# Patient Record
Sex: Female | Born: 1950 | Race: Black or African American | Hispanic: No | Marital: Married | Smoking: Never smoker
Health system: Southern US, Community
[De-identification: ages and names within clinical notes are randomized; demographics above are authoritative.]

## PROBLEM LIST (undated history)

## (undated) DIAGNOSIS — E785 Hyperlipidemia, unspecified: Secondary | ICD-10-CM

## (undated) DIAGNOSIS — M545 Low back pain, unspecified: Secondary | ICD-10-CM

## (undated) DIAGNOSIS — F329 Major depressive disorder, single episode, unspecified: Secondary | ICD-10-CM

## (undated) DIAGNOSIS — R252 Cramp and spasm: Secondary | ICD-10-CM

## (undated) DIAGNOSIS — R600 Localized edema: Secondary | ICD-10-CM

## (undated) DIAGNOSIS — I252 Old myocardial infarction: Secondary | ICD-10-CM

## (undated) DIAGNOSIS — E559 Vitamin D deficiency, unspecified: Secondary | ICD-10-CM

## (undated) DIAGNOSIS — I10 Essential (primary) hypertension: Secondary | ICD-10-CM

## (undated) DIAGNOSIS — E119 Type 2 diabetes mellitus without complications: Secondary | ICD-10-CM

## (undated) DIAGNOSIS — Z9889 Other specified postprocedural states: Secondary | ICD-10-CM

## (undated) DIAGNOSIS — I2510 Atherosclerotic heart disease of native coronary artery without angina pectoris: Secondary | ICD-10-CM

## (undated) DIAGNOSIS — M25519 Pain in unspecified shoulder: Secondary | ICD-10-CM

## (undated) DIAGNOSIS — E041 Nontoxic single thyroid nodule: Secondary | ICD-10-CM

## (undated) DIAGNOSIS — R112 Nausea with vomiting, unspecified: Secondary | ICD-10-CM

## (undated) DIAGNOSIS — R531 Weakness: Secondary | ICD-10-CM

## (undated) DIAGNOSIS — M1990 Unspecified osteoarthritis, unspecified site: Secondary | ICD-10-CM

## (undated) DIAGNOSIS — F32A Depression, unspecified: Secondary | ICD-10-CM

## (undated) DIAGNOSIS — K5900 Constipation, unspecified: Secondary | ICD-10-CM

## (undated) DIAGNOSIS — D649 Anemia, unspecified: Secondary | ICD-10-CM

## (undated) DIAGNOSIS — E669 Obesity, unspecified: Secondary | ICD-10-CM

## (undated) HISTORY — PX: OTHER SURGICAL HISTORY: SHX169

## (undated) HISTORY — DX: Nontoxic single thyroid nodule: E04.1

## (undated) HISTORY — DX: Obesity, unspecified: E66.9

## (undated) HISTORY — DX: Localized edema: R60.0

## (undated) HISTORY — DX: Type 2 diabetes mellitus without complications: E11.9

## (undated) HISTORY — DX: Cramp and spasm: R25.2

## (undated) HISTORY — DX: Old myocardial infarction: I25.2

## (undated) HISTORY — PX: BACK SURGERY: SHX140

## (undated) HISTORY — DX: Hyperlipidemia, unspecified: E78.5

## (undated) HISTORY — DX: Atherosclerotic heart disease of native coronary artery without angina pectoris: I25.10

## (undated) HISTORY — DX: Vitamin D deficiency, unspecified: E55.9

## (undated) HISTORY — DX: Pain in unspecified shoulder: M25.519

## (undated) HISTORY — DX: Weakness: R53.1

## (undated) HISTORY — DX: Constipation, unspecified: K59.00

## (undated) HISTORY — DX: Essential (primary) hypertension: I10

## (undated) HISTORY — DX: Low back pain, unspecified: M54.50

---
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
## 2002-07-26 ENCOUNTER — Encounter: Payer: Self-pay | Admitting: Emergency Medicine

## 2002-07-26 ENCOUNTER — Emergency Department (HOSPITAL_COMMUNITY): Admission: EM | Admit: 2002-07-26 | Discharge: 2002-07-26 | Payer: Self-pay | Admitting: *Deleted

## 2003-02-08 DIAGNOSIS — I2510 Atherosclerotic heart disease of native coronary artery without angina pectoris: Secondary | ICD-10-CM

## 2003-02-08 HISTORY — DX: Atherosclerotic heart disease of native coronary artery without angina pectoris: I25.10

## 2004-09-20 ENCOUNTER — Inpatient Hospital Stay (HOSPITAL_COMMUNITY): Admission: EM | Admit: 2004-09-20 | Discharge: 2004-09-23 | Payer: Self-pay | Admitting: *Deleted

## 2004-09-21 ENCOUNTER — Encounter (INDEPENDENT_AMBULATORY_CARE_PROVIDER_SITE_OTHER): Payer: Self-pay | Admitting: Cardiology

## 2005-10-08 ENCOUNTER — Emergency Department (HOSPITAL_COMMUNITY): Admission: EM | Admit: 2005-10-08 | Discharge: 2005-10-08 | Payer: Self-pay | Admitting: Emergency Medicine

## 2007-01-18 ENCOUNTER — Encounter: Admission: RE | Admit: 2007-01-18 | Discharge: 2007-01-18 | Payer: Self-pay | Admitting: Family Medicine

## 2007-12-09 ENCOUNTER — Emergency Department (HOSPITAL_COMMUNITY): Admission: EM | Admit: 2007-12-09 | Discharge: 2007-12-10 | Payer: Self-pay | Admitting: Emergency Medicine

## 2008-06-16 ENCOUNTER — Encounter: Admission: RE | Admit: 2008-06-16 | Discharge: 2008-06-16 | Payer: Self-pay | Admitting: Family Medicine

## 2009-03-09 ENCOUNTER — Encounter: Admission: RE | Admit: 2009-03-09 | Discharge: 2009-04-06 | Payer: Self-pay | Admitting: Family Medicine

## 2009-03-18 ENCOUNTER — Encounter: Admission: RE | Admit: 2009-03-18 | Discharge: 2009-03-18 | Payer: Self-pay | Admitting: *Deleted

## 2009-04-16 ENCOUNTER — Ambulatory Visit (HOSPITAL_COMMUNITY): Admission: RE | Admit: 2009-04-16 | Discharge: 2009-04-17 | Payer: Self-pay | Admitting: Neurosurgery

## 2010-05-02 LAB — BASIC METABOLIC PANEL
BUN: 10 mg/dL (ref 6–23)
CO2: 28 mEq/L (ref 19–32)
Calcium: 10 mg/dL (ref 8.4–10.5)
Chloride: 104 mEq/L (ref 96–112)
Creatinine, Ser: 0.69 mg/dL (ref 0.4–1.2)
GFR calc Af Amer: >60 mL/min (ref >60)
GFR calc non Af Amer: >60 mL/min (ref >60)
Glucose, Bld: 102 mg/dL — ABNORMAL HIGH (ref 70–99)
Potassium: 4.4 mEq/L (ref 3.5–5.1)
Sodium: 139 mEq/L (ref 135–145)

## 2010-05-02 LAB — GLUCOSE, CAPILLARY
Glucose-Capillary: 115 mg/dL — ABNORMAL HIGH (ref 70–99)
Glucose-Capillary: 119 mg/dL — ABNORMAL HIGH (ref 70–99)
Glucose-Capillary: 128 mg/dL — ABNORMAL HIGH (ref 70–99)
Glucose-Capillary: 143 mg/dL — ABNORMAL HIGH (ref 70–99)
Glucose-Capillary: 177 mg/dL — ABNORMAL HIGH (ref 70–99)

## 2010-05-02 LAB — CBC
HCT: 38.8 % (ref 36.0–46.0)
Hemoglobin: 13.1 g/dL (ref 12.0–15.0)
MCHC: 33.8 g/dL (ref 30.0–36.0)
MCV: 89.7 fL (ref 78.0–100.0)
Platelets: 216 10*3/uL (ref 150–400)
RBC: 4.33 MIL/uL (ref 3.87–5.11)
RDW: 15.7 % — ABNORMAL HIGH (ref 11.5–15.5)
WBC: 6.6 10*3/uL (ref 4.0–10.5)

## 2010-05-02 LAB — SURGICAL PCR SCREEN
MRSA, PCR: NEGATIVE
Staphylococcus aureus: NEGATIVE

## 2010-06-09 ENCOUNTER — Other Ambulatory Visit: Payer: Self-pay | Admitting: Family Medicine

## 2010-06-09 DIAGNOSIS — Z1231 Encounter for screening mammogram for malignant neoplasm of breast: Secondary | ICD-10-CM

## 2010-06-11 ENCOUNTER — Ambulatory Visit
Admission: RE | Admit: 2010-06-11 | Discharge: 2010-06-11 | Disposition: A | Discharge status: Home / Self Care | Payer: 59 | Source: Ambulatory Visit | Attending: Family Medicine | Admitting: Family Medicine

## 2010-06-11 DIAGNOSIS — Z1231 Encounter for screening mammogram for malignant neoplasm of breast: Secondary | ICD-10-CM

## 2010-06-25 NOTE — Cardiovascular Report (Signed)
NAME:  Angela Morrison, Angela Morrison NO.:  [id number]   MEDICAL RECORD NO.:  [id number]          PATIENT TYPE:  INP   LOCATION:  2030                         FACILITY:  [facility]   PHYSICIAN:  Vesta Mixer, M.D. DATE OF BIRTH:  November 27, 1950   DATE OF PROCEDURE:  09/22/2004  DATE OF DISCHARGE:                              CARDIAC CATHETERIZATION   Ms. Angela Morrison is a 60 year old female who was admitted to the hospital with  episodes of chest pain. She had a diagnostic cath this morning by Dr. Mayford Knife  which revealed a tight right coronary stenosis. She was referred for PTCA  and stenting of this vessel.   Please see the previously dictated diagnostic cath by Dr. Mayford Knife.   The patient received 5200 units of heparin initially. Later in the case she  also received 2000 unit bolus. She received IV Integrilin in the double  bolus fashion. The right coronary artery was cannulated using a Judkins  right 4 guide. Later the case, this guide was traded out for an Amplatz  right 1 guide for better support. The right coronary artery was wired using  a Patriot guidewire. A 2.75 x 15 mm Quantum monorail was positioned across  the stenosis in the mid right coronary artery. It was inflated up to 6  atmospheres for 22 seconds which resulted in some improvement of this  lesion.   At this point a 2.5 x 16 mm TAXUS stent was positioned across the stenosis.  It was deployed at 16 atmospheres for 24 seconds. This stent was removed and  a second 2.5 x 16 mm stent was positioned just proximal to the first stent  and was deployed at 18 atmospheres for 22 seconds. At this point we needed  to trade out the guide and used the Amplatz right 1 guide for better back  up. This allowed passage of the 2.75 x 15 mm Quantum monorail used for post  stent dilatation.   It was inflated up to a total of 5 times throughout the two stented areas.  It was inflated 3 times for 18 atmospheres and then twice up to 20  atmospheres. This resulted in a very nice angiographic result. There is no  edge dissection. There is brisk flow down the vessel. The size achieved is  right at the size of the native vessel. There were no complications.   COMPLICATIONS:  None.   CONCLUSION:  Successful percutaneous transluminal coronary angioplasty and  stenting of the mid and proximal right coronary artery.  She received Plavix  600 mg here in the lab. She will be admitted to the [facility] and anticipate  discharge tomorrow.           ______________________________  Vesta Mixer, M.D.     PJN/MEDQ  D:  09/22/2004  T:  09/22/2004  Job:  [id number]   cc:   Pam Drown, M.D.  [address]  [place]  [place] [address]  Fax: [phone]   Armanda Magic, M.D.  [address]  [place], [place] [address]  Fax: [phone]

## 2010-06-25 NOTE — Consult Note (Signed)
NAME:  Angela Morrison, Angela Morrison             ACCOUNT NO.:  [id number]   MEDICAL RECORD NO.:  [id number]          PATIENT TYPE:  INP   LOCATION:  2030                         FACILITY:  [facility]   PHYSICIAN:  Armanda Magic, M.D.     DATE OF BIRTH:  18-Jan-1951   DATE OF CONSULTATION:  09/21/2004  DATE OF DISCHARGE:                                   CONSULTATION   REFERRING PHYSICIAN:  Dr. Renford Dills   PRIMARY DOCTOR:  Dr. Gweneth Dimitri   REASON FOR CONSULT:  Chest pain with multiple cardiac risk factors.   This is very pleasant 60 year old obese black female with the history of  hypertension, diabetes mellitus, and dyslipidemia who relates an episode of  substernal chest pain which awakened her from sleep. It started with severe  achiness in her arm around 4 a.m. early this morning, and then progressed to  severe chest tightness of a 7 or 8 out of 10. She woke her husband up and he  took her to the emergency room. On the way to the emergency room she  developed severe shortness of breath as well as nausea. She was given  aspirin and had no relief. She was started on IV nitroglycerin with relief  of chest and arm pain. She has not had any further episodes since admission.  She does give a several-month history of bilateral hand paresthesias that  awakened her from sleep. No occurrence with activity or use of hands. She  has had increasing fatigue over the past 6 weeks but attributes this to  stress at work and working long hours. Cardiac enzymes thus far are  negative. A 2-D echocardiogram today showed normal LV function, inadequate  for evaluation of regional wall motion abnormalities due to poor acoustical  windows.   PAST MEDICAL HISTORY:  Significant for adult-onset diabetes mellitus,  hypertension, dyslipidemia, and obesity.   PAST SURGICAL HISTORY:  None.   ALLERGIES:  None.   SOCIAL HISTORY:  She denies any tobacco use. She occasionally drinks  alcohol. She is married. She has one  child. She works for the [employer].   FAMILY HISTORY:  Her mother had cancer of the throat. Her father did not  have any cardiac disease but she does have two maternal uncles who both had  heart attacks in their 96s.   MEDICATIONS ON ADMISSION:  1.  Lotrel 5/10 a day.  2.  Actos 15 mg a day although she quit taking this secondary to memory      issues.   INPATIENT MEDICINES:  1.  Lopressor 25 mg b.i.d.  2.  Aspirin 325 mg a day.  3.  Protonix 40 mg a day.  4.  Lotrel 5/10 a day.  5.  Lipitor 20 mg a day.  6.  Actos 15 mg a day.  7.  Lovenox subcu q.12h.   REVIEW OF SYSTEMS:  Other than what is stated in the HPI is negative.   PHYSICAL EXAMINATION:  VITAL SIGNS:  Blood pressure is 144/83, pulse 71,  respirations 20, she is afebrile.  GENERAL:  This is well-developed, obese black  female in no acute distress.  HEENT:  Benign.  NECK:  Supple without lymphadenopathy. Carotid upstrokes are +2 bilaterally  with no bruits.  LUNGS:  Clear to auscultation throughout.  HEART:  Regular rate and rhythm. No murmurs, rubs, or gallops. Normal S1 and  S2.  ABDOMEN:  Soft, nontender, nondistended, with active bowel sounds. No  hepatosplenomegaly.  EXTREMITIES:  No edema.   LABORATORY DATA:  Sodium 140, potassium 3.8, chloride 108, bicarb 25, BUN 4,  creatinine 0.6, glucose 128. White cell count 7.1, platelet count 289,  hematocrit 37, hemoglobin 12. D-dimer less than 0.22. PT 12.1, INR 0.9, PTT  29. Myoglobins are 55.4, 56.4, and 61; MB less 1.0 x3; troponin less than  0.05 x3. EKG shows normal sinus rhythm with T wave inversion in lead III and  aVF, nonspecific T wave abnormality in the precordial leads. Chest x-ray  shows mild cardiomegaly, no CHF. A 2-D echocardiogram shows essentially  normal LV systolic overall but could not comment on regional wall motion  abnormalities.   ASSESSMENT:  1.  Chest pain, very worrisome for underlying coronary ischemia given the      chest  pressure with radiation to the left arm associated with nausea and      shortness of breath and relieved with nitroglycerin. The patient does      have cardiac risk factors including obesity, her age, diabetes mellitus,      hyperlipidemia, and hypertension. The patient also has a family history      of heart disease with multiple MIs in maternal uncles in their 62s.      Currently on Lovenox, aspirin, beta blocker, and ACE inhibitor without      recurrence of chest pain. Of note, the patient was hypertensive on      admission. It is unclear whether hypertension came first and she      developed some subendocardial ischemia due to increased wall stress and      decreased transmyocardial perfusion with resultant subendocardial      ischemia due to hypertension versus underlying coronary disease. She      does have clearly multiple cardiac risk factors for underlying coronary      disease. Given her significant obesity, most likely stress Cardiolite      study would be abnormal due to body habitus and therefore would      recommend, given the fairly high probability of underlying coronary      disease, to proceed with cardiac catheterization.  2.  Adult-onset diabetes mellitus, poorly controlled.  3.  Dyslipidemia, started on Lipitor.  4.  Hypertension, currently controlled   PLAN:  I have recommended proceeding with cardiac catheterization. I have  explained the risks and benefits of cardiac catheterization including the  risk of MI, CVA, death, bleeding complications, IV contrast allergy,  pseudoaneurysm, A-V fistula, and renal failure. The patient understands and  wishes to proceed. We have scheduled her for tomorrow morning at 8:30 a.m.  In regards to her other cardiac risk factors, would be aggressive in  treating dyslipidemia, adult-onset diabetes mellitus, as well as her  hypertension.      Armanda Magic, M.D. Electronically Signed     TT/MEDQ  D:  09/21/2004  T:  09/21/2004   Job:  [id number]   cc:   Pam Drown, M.D.  [address]  [place]  [place] [address]  Fax: [phone]

## 2010-06-25 NOTE — Cardiovascular Report (Signed)
NAME:  Angela Morrison, Angela Morrison             ACCOUNT NO.:  [id number]   MEDICAL RECORD NO.:  [id number]          PATIENT TYPE:  INP   LOCATION:  2030                         FACILITY:  [facility]   PHYSICIAN:  Armanda Magic, M.D.     DATE OF BIRTH:  25-Nov-1950   DATE OF PROCEDURE:  09/22/2004  DATE OF DISCHARGE:                              CARDIAC CATHETERIZATION   REFERRING PHYSICIAN:  Renford Dills, M.D.   PROCEDURE:  1.  Left heart catheterization.  2.  Coronary angiography.  3.  Left ventriculography.   OPERATOR:  Armanda Magic, M.D.   INDICATIONS:  Chest pain, shortness of breath.   COMPLICATIONS:  None.   IV ACCESS:  Via right femoral artery with a 6-French sheath.   INDICATION:  This is a 60 year old black female with a history of  hypertension, diabetes mellitus and hyperlipidemia, who, on presenting to  the emergency room after wakening with sudden onset of severe chest pain and  shortness of breath, cardiac enzymes were negative and EKG was non-ischemic.  She now presents for cardiac catheterization.   DESCRIPTION OF PROCEDURE:  The patient was brought to the cardiac  catheterization laboratory in a fasting non-sedated state.  Informed consent  was obtained.  The patient was connected to continuous heart rate and pulse  oximetry monitoring and intermittent blood pressure monitoring.  The right  groin was prepped and draped in a sterile fashion.  1% Xylocaine was used  for local anesthesia.  Using a modified Seldinger technique, a 6-French  sheath was placed in the right femoral artery.  Under fluoroscopic guidance,  a 6-French JL4 catheter was placed in the left coronary artery.  Multiple  cine films were taken  in 30-degree RAO and 40-degree LAO views.  This  catheter was then exchanged out over a guidewire for 6-French JR4 catheter,  which was placed under fluoroscopic guidance in the right coronary artery.  Multiple cine films were taken in the 30-degree RAO and 40-degree LAO  views.  This catheter was then exchanged out over a guidewire for 6-French angled  pigtail catheter which was placed under fluoroscopic guidance into the left  ventricular cavity.  Left ventriculography was performed in the 30-degree  RAO views using a total of 30 mL of contrast at 15 mL per second.  The  catheter was then pulled back across the aortic valve with no significant  gradient noted.  At the end of the procedure, all catheters were removed and  the patient subsequently went onto PCI of the RCA.   RESULTS:  The left main coronary is widely patent and trifurcates into a  left anterior descending artery, the left circumflex artery and ramus  branch.  The left anterior descending artery is widely patent throughout its  course to the apex, giving rise to a diagonal branch which is widely patent.   The left circumflex is widely patent throughout its course, giving rise to a  large obtuse marginal branch which is widely patent.  The ongoing circumflex  is widely patent.   The ramus is widely patent and bifurcates into 2 daughter branches.  The  inferior branch has a 30% to 40% proximal stenosis, otherwise patent.   The right coronary artery is diffusely diseased with a 50% proximal  eccentric plaque and then a mid 30% narrowing and just distal to the mid 30%  narrowing, there is an 80% apple core lesion of the mid-to-distal RCA.  The  vessel then bifurcates into a posterior descending artery and posterolateral  artery, both of which are widely patent.   Left ventriculography shows normal LV systolic function, aortic pressure  150/94 mmHg, LV pressure 153/12 mmHg.   ASSESSMENT:  1.  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  2.  Normal left ventricular function..  3.  Hypertension, poorly controlled  4.  Diabetes mellitus.  5.  Dyslipidemia, poorly controlled.   PLAN:  PCI to RCA by Dr. Elease Hashimoto, aspirin and Plavix, Integrilin IV per Dr.  Elease Hashimoto,  start Lipitor 40 mg a day, fasting statin panel in 6 weeks, increase  Lopressor to 50 mg b.i.d.      Armanda Magic, M.D.  Electronically Signed     TT/MEDQ  D:  09/22/2004  T:  09/22/2004  Job:  [id number]   cc:   Deirdre Peer. Polite, M.D.   Pam Drown, M.D.  [address]  [place]  [place] [address]  Fax: [phone]

## 2010-06-25 NOTE — H&P (Signed)
NAME:  Angela Morrison, Angela Morrison                 ACCOUNT NO.:  0   MEDICAL RECORD NO.:  [id number]           PATIENT TYPE:   LOCATION:                               FACILITY:  [facility]   PHYSICIAN:  [name] L. Ladona Ridgel, MD  DATE OF BIRTH:  1950-09-20   DATE OF ADMISSION:  09/20/2004  DATE OF DISCHARGE:                                HISTORY & PHYSICAL   CHIEF COMPLAINT:  Chest pain.   PRIMARY CARE PHYSICIAN:  Dr. Gweneth Dimitri.   HISTORY OF PRESENT ILLNESS:  This is a 60 year old African-American female  who was in her usual state of health until she was awakened at 3 a.m. with  excruciating left arm pain, she described it as 3 out of 4/10.  She states  she laid back down, the pain persisted so she woke her husband up.  Patient  noted pressure then in her left upper chest which was associated with some  nausea.  So she came to the emergency room.  On the way to the emergency  room the patient developed shortness of breath.  In the emergency room the  patient was found to be hypertensive, tachycardic and was started on  sublingual nitro and likely an aspirin.  She states that the pain did not  improve then, so they started her on a nitro drip, which relieved the pain.  She states the pain was exacerbated by nothing, it was persistent.   REVIEW OF SYSTEMS:  She states she has had some changes in vision and is  scheduled to see the ophthalmologist.  She has had significant weight gain  over the last 6 months, but she associates that to stress and increased  eating.  She said she has had frequent urination but no dysuria but some  urgency, no constipation, no diarrhea, no melena, no hematochezia, no muscle  or joint discomfort.  She has had some insomnia which her primary care  physician gave her meds for which did not help.  All other review of systems  appear to be negative.   PAST MEDICAL HISTORY:  1.  Hypertension.  2.  Diabetes.   She is currently not taking her diabetic medication because  she thought it  was making her lose her memory.   PAST SURGICAL HISTORY:  Appendectomy.   SOCIAL HISTORY:  She denies tobacco.  She has limited alcohol.  She is  married with a daughter, works for the [employer] in [employer].   FAMILY HISTORY:  Mom is living with cancer of the throat and glaucoma.  Dad  is living with hearing loss but no other medical problems that she is aware  of.   ALLERGIES:  NO KNOWN DRUG ALLERGIES.   MEDICATIONS:  She is supposed to be taking Lotrel 5/10 daily, Actos 50 mg  daily.  She states a water pill, but I believe it is the Lotrel that she is  referring to.   VITAL SIGNS:  Temperature 98.8, blood pressure 170/102 on admission, after  nitro 107/74.  Pulse was 124 on admission, after Toprol she is  64.  Respirations 30 down to 60, saturation 100% on 2 liters.  Generally, this is  a well-developed, well-nourished, moderately obese African-American female  in no acute distress.  She is normocephalic, atraumatic.  Pupils equal,  round, reactive to light, extraocular muscles are intact, mucous membranes  are moist.  NECK:  Supple.  There is no JVD, no lymph nodes, no carotid bruits.  CHEST:  Clear to auscultation.  There is no rhonchi, rubs or wheezes.  CARDIOVASCULAR:  Regular rate, rhythm.  A positive S1, S2.  No S3, S4.  She  has no costovertebral angle tenderness on back exam.  ABDOMEN:  Soft, nontender, nondistended with positive bowel sounds.  EXTREMITIES:  Show no clubbing, cyanosis or edema.  NEUROLOGICAL:  Cranial nerves II-XII are intact, power is 5/5, DTRs are 2+.   LABS:  Point of care enzymes are negative x3.  Other labs:  Initially, her  first BMET in the emergency room showed a potassium of 8.3, this was  repeated, and is as follows:  Sodium is 139, potassium 4.0, chloride 108,  CO2 is 35.7, BUN 7, creatinine is 0.6.  Her blood sugars were 149 and 169.  PT is 12.1 with an INR of 0.9.  EKG is normal sinus rhythm  with a heart rate  of 64, no ST T-wave changes are noted.  Chest x-ray was reviewed, shows  cardiomegaly with low lung volumes, no obvious congestive heart failure is  present.   ASSESSMENT AND PLAN:  This is a 60 year old African-American female with  atypical chest pain waking her from sleep, associated with left arm  radiation, nausea and shortness of breath.  The patient's pain was relieved  with a nitro drip.  1.  Cardiovascular.  Atypical chest pain relieved with nitroglycerin.  Will      continue her aspirin, nitroglycerin drip, beta blockade and start her on      Lovenox at full ACS levels.  Will check a D-dimer.  If positive, will      check a chest CT.  Will also check a TSH.  Check a 2D echocardiogram.  2.  Pulmonary.  Shortness of breath has resolved.  May have been associated      to anxiety; however, I will check a D-dimer.  If it is positive, will      check a CT.  Please continue oxygen.  3.  Gastrointestinal.  Will start her on Protonix while on aspirin to treat      possible gastroesophageal reflux disease.  4.  Genitourinary.  With the frequency and urgency, will check a UA, C&S.  5.  Endocrine.  Will check a TSH, hemoglobin A1, continue her Actos and add      sliding scale insulin.      [name] L. Ladona Ridgel, MD  Electronically Signed     MLT/MEDQ  D:  09/20/2004  T:  09/20/2004  Job:  [id number]   cc:   Pam Drown, M.D.  [address]  [place]  [place] [address]  Fax: [phone]   [facility]  [address]  [place], [place] [address]

## 2010-06-25 NOTE — Discharge Summary (Signed)
NAME:  Angela Morrison, Angela Morrison             ACCOUNT NO.:  [id number]   MEDICAL RECORD NO.:  [id number]          PATIENT TYPE:  INP   LOCATION:  6531                         FACILITY:  [facility]   PHYSICIAN:  Armanda Magic, M.D.     DATE OF BIRTH:  October 31, 1950   DATE OF ADMISSION:  09/20/2004  DATE OF DISCHARGE:  09/23/2004                                 DISCHARGE SUMMARY   ADMITTING PHYSICIAN:  [name] L. Ladona Ridgel, M.D., [facility].   DISCHARGING PHYSICIAN:  Armanda Magic, M.D.   CHIEF COMPLAINT AND REASON FOR ADMISSION:  Ms. Kreps is a 60 year old female  patient, diabetic, hypertensive history who was awakened on the morning of  admission with significant left arm aching.  This progressed to chest  tightness.  She woke up her husband and came to the ER, was given aspirin  without any relief.  She had also developed shortness of breath and nausea  en route to the hospital.  She was subsequently placed on IV nitroglycerin  in the ER with gradual relief of chest and arm pain.  This is her only  episode of this type of significant.  Over the past several months, she has  had bilateral hand paresthesias that have awakened her from sleeping.  She  does not have these similar symptoms with activity, exertion or use of her  hands.  She has noticed increased fatigue for about four to six weeks.  Patient has been attributing this to increased work stressors and increased  working hours.  EKG done at time of arrival shows sinus rhythm with T-wave  inversion in lead III, elevated J point in aVL, subtle ST segment elevation  less than 1 mm in V2, questionable T-wave inversion in lead V3.  Initial  point of care enzymes were negative.  D-dimer was negative and patient was  not hypoxic upon admission.  Because of these findings, Hospitalists  admitted the patient with the following diagnoses.   ADMISSION DIAGNOSES:  1.  Chest pain relieved with nitroglycerin.  Request cardiology consult.  2.  With  atypical chest pain, rule out pulmonary embolus.  3.  Rule out gastrointestinal etiology such as ulcer or reflux.  4.  Reported history of urinary frequency and urgency.  5.  Diabetes.  Patient has not been taking medications recently secondary to      concerns of medication induced memory loss.   HOSPITAL COURSE:  PROBLEM #1 -  CHEST DISCOMFORT:  Cardiology consult was  obtained on the morning of September 21, 2004, with Dr. Mayford Knife.  Subsequently  cardiac isoenzymes were found to be negative.  EKG was stable.  A 2-D  echocardiogram  had also been done prior to our consultation.  It was  inadequate for evaluation of regional wall motion abnormalities.  Otherwise,  normal LVEF.  A D-dimer had been checked and this was negative.  CT of the  chest was checked and this was negative for PE.  Based on the patient's  symptoms, Dr. Mayford Knife was quite concerned she had underlying occult coronary  disease and due to patient's body habitus, moderate size abdomen and large  breasts, we opted to proceed directly with coronary angiography.  Patient  was taken to the cath lab on September 22, 2004, where she was found to have a  tight lesion of the RCA.  Subsequently, Dr. Elease Hashimoto performed PCI and TAXUS  stent implantation to the proximal and mid RCA.  Patient tolerated the  procedure well.  By September 23, 2004, patient's groin was unremarkable.  Distal pulse was intact.  She was without chest pain and determined  appropriate for discharge home.  From a cardiac standpoint, because of her  new diagnosis of CAD, beta-blocker Toprol, has been added.  She will  continue her Lotrel as previous.   PROBLEM #2 -  DIABETES MELLITUS:  Again, as mentioned in the history of  present illness, patient had stopped taking her Actos because she felt this  was causing memory loss.  This medication was reinitiated by the  Hospitalists.  Hemoglobin A1c was checked and was elevated at 7.5.  TSH was  checked due to patient's  complaints of fatigue.  This was normal at 1.341.  Patient will discharge home on Actos and follow up with primary care  physician for additional diabetic management.   PROBLEM #3 -  URINARY FREQUENCY AND URGENCY:  Patient had been empirically  started on Cipro. Urinalysis and culture was obtained.  There were a number  of leukocytes in the urine without positive nitrites, no protein, no red  blood cells, and few bacteria.  Urine culture was negative.  The Cipro was  only continued through the hospitalization.   PROBLEM #4 -  DYSLIPIDEMIA:  Due to patient's complaint of chest pain, a  fasting lipid panel was obtained.  Cholesterol 258, triglycerides 133, HDL  47 and LDL 184.  Based on these findings, Lipitor 40 mg was added and we are  recommending that patient follow up in our office for repeat Statin panel in  six weeks.  Note that LFTs were normal upon this hospitalization.   FINAL DISCHARGE DIAGNOSES:  1.  Chest pain and arm pain secondary to single vessel coronary artery      disease.  2.  Status post percutaneous coronary intervention and TAXUS stent      implantation to mid and proximal right coronary artery.  3.  Dyslipidemia with new initiation of Statin therapy.  4.  Diabetes mellitus, uncontrolled, initiated on oral therapy.  5.  Hypertension, currently controlled.  6.  Dysuria and urinary frequency with negative urine culture, negative      urinalysis, status post empiric treatment with Cipro.   DISCHARGE MEDICATIONS:  1.  Aspirin 325 mg daily.  2.  Lotrel 5/10 daily.  3.  Actos 15 mg daily.  4.  Toprol XL 50 mg daily.  5.  Plavix 75 mg daily.  6.  Lipitor 40 mg daily.   DIET:  Heart healthy.   ACTIVITY RESTRICTIONS:  Increase activity slowly, shower only for the next  two days.  No lifting more than 10 pounds for the next two days.  May return  to work on Monday, September 27, 2004.  WOUND CARE:  You are to call Dr. Norris Cross office if you have any swelling or   increased bruising at the right groin site from the catheterization.   FOLLOW UP APPOINTMENTS:  1.  You need to call Dr. Pam Drown, your primary care physician, for      follow-up regarding your diabetes and if urinary symptoms recur.  2.  You should follow up with  Dr. Mayford Knife in the office on October 07, 2004,      at 10:30 in the morning.  3.  You are to have a fasting Statin panel to check your cholesterol and      liver enzymes at Dr. Norris Cross office on November 04, 2004, at 7:30 in      the morning.  Instructions have been given for the patient to have      nothing to eat or drink eight hours before the cholesterol lab work.      Allison L. Ulla Gallo, M.D.  Electronically Signed    ALE/MEDQ  D:  09/23/2004  T:  09/23/2004  Job:  [id number]   cc:   Pam Drown, M.D.  [address]  [place]  [place] [address]  Fax: [phone]

## 2010-11-09 LAB — POCT CARDIAC MARKERS: Troponin i, poc: <0.05

## 2010-11-09 LAB — CBC
HCT: 38
MCHC: 33.3
MCV: 88.8
Platelets: 226
RDW: 14.7

## 2010-11-09 LAB — DIFFERENTIAL
Basophils Absolute: 0
Eosinophils Absolute: 0.1
Lymphocytes Relative: 10 — ABNORMAL LOW
Lymphs Abs: 1
Monocytes Absolute: 0.8
Monocytes Relative: 7
Neutro Abs: 8.8 — ABNORMAL HIGH

## 2010-11-09 LAB — LIPASE, BLOOD: Lipase: 22

## 2010-11-09 LAB — URINALYSIS, ROUTINE W REFLEX MICROSCOPIC
Bilirubin Urine: NEGATIVE
Hgb urine dipstick: NEGATIVE
Ketones, ur: NEGATIVE
Urobilinogen, UA: 1

## 2010-11-09 LAB — COMPREHENSIVE METABOLIC PANEL
AST: 17
Alkaline Phosphatase: 105
BUN: 13
CO2: 25
Calcium: 9.5
Creatinine, Ser: 0.8
Glucose, Bld: 126 — ABNORMAL HIGH
Sodium: 135
Total Bilirubin: 1
Total Protein: 8

## 2010-11-09 LAB — B-NATRIURETIC PEPTIDE (CONVERTED LAB): Pro B Natriuretic peptide (BNP): <30

## 2011-08-16 ENCOUNTER — Emergency Department (HOSPITAL_COMMUNITY): Payer: 59

## 2011-08-16 ENCOUNTER — Emergency Department (HOSPITAL_COMMUNITY)
Admission: EM | Admit: 2011-08-16 | Discharge: 2011-08-16 | Disposition: A | Discharge status: Home / Self Care | Payer: 59 | Attending: Emergency Medicine | Admitting: Emergency Medicine

## 2011-08-16 DIAGNOSIS — R0602 Shortness of breath: Secondary | ICD-10-CM | POA: Insufficient documentation

## 2011-08-16 DIAGNOSIS — Z79899 Other long term (current) drug therapy: Secondary | ICD-10-CM | POA: Insufficient documentation

## 2011-08-16 DIAGNOSIS — N390 Urinary tract infection, site not specified: Secondary | ICD-10-CM

## 2011-08-16 DIAGNOSIS — R42 Dizziness and giddiness: Secondary | ICD-10-CM

## 2011-08-16 LAB — PROTIME-INR
INR: 0.97 (ref 0.00–1.49)
Prothrombin Time: 13.1 seconds (ref 11.6–15.2)

## 2011-08-16 LAB — CBC
Hemoglobin: 13.1 g/dL (ref 12.0–15.0)
MCH: 28.7 pg (ref 26.0–34.0)
MCHC: 33.2 g/dL (ref 30.0–36.0)
Platelets: 252 10*3/uL (ref 150–400)

## 2011-08-16 LAB — COMPREHENSIVE METABOLIC PANEL
ALT: 9 U/L (ref 0–35)
Calcium: 9.8 mg/dL (ref 8.4–10.5)
Creatinine, Ser: 0.67 mg/dL (ref 0.50–1.10)
GFR calc Af Amer: >90 mL/min (ref >90)
Glucose, Bld: 117 mg/dL — ABNORMAL HIGH (ref 70–99)
Sodium: 139 mEq/L (ref 135–145)
Total Protein: 8.1 g/dL (ref 6.0–8.3)

## 2011-08-16 LAB — URINALYSIS, ROUTINE W REFLEX MICROSCOPIC
Hgb urine dipstick: NEGATIVE
Nitrite: NEGATIVE
Specific Gravity, Urine: 1.011 (ref 1.005–1.030)
Urobilinogen, UA: 0.2 mg/dL (ref 0.0–1.0)

## 2011-08-16 LAB — URINE MICROSCOPIC-ADD ON

## 2011-08-16 MED ORDER — FOSFOMYCIN TROMETHAMINE 3 G PO PACK
3.0000 g | PACK | Freq: Once | ORAL | Status: AC
  Administered 2011-08-16: 3 g via ORAL
  Filled 2011-08-16: qty 3

## 2011-08-16 NOTE — ED Notes (Signed)
Checked patient cbg it was 67 notified RN Ed of blood sugar

## 2011-08-16 NOTE — ED Notes (Signed)
Patient transported to CT 

## 2011-08-16 NOTE — ED Provider Notes (Signed)
History     CSN: [id number]  Arrival date & time 08/16/11  4098   First MD Initiated Contact with Patient 08/16/11 [phone]      Chief Complaint  Patient presents with  . Dizziness  . Shortness of Breath     HPI The patient presents with dizziness.  Notes that when she developed that she was in her usual state of health.  She awoke approximately 3 hours ago with dizziness.  She describes the room spinning sensationthat has been intermittently present since onset.  She notes that when symptoms are present she is mildly lightheaded, but in no pain.  She denies any confusion, vomiting, visual changes.  There are no clear precipitating, exacerbating, alleviating factors. She denies any prior similar occurrences. Notably, the patient is currently being treated for scalp infection with plaquenil, to which she has previously had adverse (nausea) reactions.  She has a Hx of CAD w prior stenting, and HTN.  She is compliant with all meds. No past medical history on file.  No past surgical history on file.  No family history on file.  History  Substance Use Topics  . Smoking status: Not on file  . Smokeless tobacco: Not on file  . Alcohol Use: Not on file    OB History    No data available      Review of Systems  Constitutional:       HPI  HENT:       HPI otherwise negative  Eyes: Negative.   Respiratory:       HPI, otherwise negative  Cardiovascular:       HPI, otherwise nmegative  Gastrointestinal: Negative for vomiting.  Genitourinary:       HPI, otherwise negative  Musculoskeletal:       HPI, otherwise negative  Skin: Negative.   Neurological: Negative for syncope.    Allergies  Review of patient's allergies indicates no known allergies.  Home Medications   Current Outpatient Rx  Name Route Sig Dispense Refill  . ACETAMINOPHEN ER 650 MG PO TBCR Oral Take 650 mg by mouth every 8 (eight) hours as needed. Pain    . AMLODIPINE BESY-BENAZEPRIL HCL 5-10 MG PO CAPS  Oral Take 1 capsule by mouth daily.    . ASPIRIN 325 MG PO TABS Oral Take 325 mg by mouth daily.    Marland Kitchen CLOPIDOGREL BISULFATE 75 MG PO TABS Oral Take 75 mg by mouth daily.    Marland Kitchen COENZYME Q10 30 MG PO CAPS Oral Take 30 mg by mouth daily.    . ERGOCALCIFEROL 50000 UNITS PO CAPS Oral Take 50,000 Units by mouth 2 (two) times a week. Takes on Mondays & Fridays    . HYDROXYCHLOROQUINE SULFATE 200 MG PO TABS Oral Take 200 mg by mouth 2 (two) times daily.    Marland Kitchen PIOGLITAZONE HCL 15 MG PO TABS Oral Take 15 mg by mouth daily.    Marland Kitchen SIMVASTATIN 20 MG PO TABS Oral Take 20 mg by mouth every evening.      BP 155/94  Pulse 88  Temp 98 F (36.7 C) (Oral)  Resp 20  SpO2 100%  Physical Exam  Nursing note and vitals reviewed. Constitutional: She is oriented to person, place, and time. She appears well-developed and well-nourished. No distress.  HENT:  Head: Normocephalic and atraumatic.  Eyes: Conjunctivae and EOM are normal.  Cardiovascular: Normal rate and regular rhythm.   Pulmonary/Chest: Effort normal and breath sounds normal. No stridor. No respiratory distress.  Abdominal: She exhibits  no distension.  Musculoskeletal: She exhibits no edema.  Neurological: She is alert and oriented to person, place, and time. No cranial nerve deficit.       Strength is symmetric 5/5 in all extremities.  There is no discoordination , no ataxia , gait is appropriate  Skin: Skin is warm and dry.  Psychiatric: She has a normal mood and affect.    ED Course  Procedures (including critical care time)  Labs Reviewed  GLUCOSE, CAPILLARY - Abnormal; Notable for the following:    Glucose-Capillary 107 (*)     All other components within normal limits  CBC  COMPREHENSIVE METABOLIC PANEL  TROPONIN I  URINALYSIS, ROUTINE W REFLEX MICROSCOPIC   Dg Chest 2 View  08/16/2011  *RADIOLOGY REPORT*  Clinical Data: Shortness of breath and dizziness.  History of hypertension and diabetes.  CHEST - 2 VIEW  Comparison: 04/10/2009.   Findings: Poor inspiration.  Grossly normal sized heart.  Minimal scoliosis.  IMPRESSION: No acute abnormality.  Original Report Authenticated By: Darrol Angel, M.D.     No diagnosis found.  Cardiac 80 sinus rhythm normal Pulse ox 100% ra, normal   Date: 08/16/2011  Rate: 85  Rhythm: normal sinus rhythm  QRS Axis: normal  Intervals: QT prolonged  ST/T Wave abnormalities: normal  Conduction Disutrbances:none  Narrative Interpretation:   Old EKG Reviewed: changes noted Minimal changes in anterior T waves from 11/09 - ABNORMAL ECG   MDM  This 61 year old female now presents with ongoing dizziness.given the patient's description of a new medication, this is an immediate suspicion.  Given the relatively sudden onset of symptoms, in an elderly female there is some suspicion of acute intracranial process.  The patient's evaluation is most notable for demonstration of chronic hypertensive effects intracerebrally, and a possibly UTI.  Given the absence of any focal neurologic deficits, there is low suspicion for ongoing acute CVA.  The patient's denial of appreciable chest pain, dyspnea is reassuring for the low suspicion of significant cardiac ischemia or pulmonary etiology.  The patient's medication use, this seems most likely, the patient's urine that was consistent with UTI will treat or.  Discussed all findings with the patient her family, including the for continued monitoring and evaluation.  Specifically, the patient is to consider carotid Doppler studies, and to discuss further medication titration with her team of physicians.  Given that the patient is already taking aspirin and Plavix, and there is low suspicion for acute new CVA, MRI is not indicated.   Gerhard Munch, MD 08/16/11 1229

## 2011-08-16 NOTE — ED Notes (Signed)
Pt in CT.

## 2011-08-18 ENCOUNTER — Other Ambulatory Visit: Payer: Self-pay | Admitting: Family Medicine

## 2011-08-18 DIAGNOSIS — R42 Dizziness and giddiness: Secondary | ICD-10-CM

## 2011-08-19 ENCOUNTER — Ambulatory Visit: Payer: 59 | Attending: Family Medicine | Admitting: Physical Therapy

## 2011-08-19 DIAGNOSIS — IMO0001 Reserved for inherently not codable concepts without codable children: Secondary | ICD-10-CM | POA: Insufficient documentation

## 2011-08-19 DIAGNOSIS — M25669 Stiffness of unspecified knee, not elsewhere classified: Secondary | ICD-10-CM | POA: Insufficient documentation

## 2011-08-19 DIAGNOSIS — M25569 Pain in unspecified knee: Secondary | ICD-10-CM | POA: Insufficient documentation

## 2011-08-23 ENCOUNTER — Ambulatory Visit: Payer: 59 | Admitting: Physical Therapy

## 2011-08-25 ENCOUNTER — Ambulatory Visit: Payer: 59 | Admitting: Rehabilitation

## 2011-08-30 ENCOUNTER — Ambulatory Visit: Payer: 59 | Admitting: Physical Therapy

## 2011-09-01 ENCOUNTER — Ambulatory Visit: Payer: 59 | Admitting: Physical Therapy

## 2011-09-01 ENCOUNTER — Ambulatory Visit
Admission: RE | Admit: 2011-09-01 | Discharge: 2011-09-01 | Disposition: A | Discharge status: Home / Self Care | Payer: 59 | Source: Ambulatory Visit | Attending: Family Medicine | Admitting: Family Medicine

## 2011-09-01 DIAGNOSIS — R42 Dizziness and giddiness: Secondary | ICD-10-CM

## 2011-09-02 ENCOUNTER — Other Ambulatory Visit: Payer: Self-pay | Admitting: Family Medicine

## 2011-09-02 DIAGNOSIS — E041 Nontoxic single thyroid nodule: Secondary | ICD-10-CM

## 2011-09-05 ENCOUNTER — Other Ambulatory Visit: Payer: Self-pay | Admitting: Family Medicine

## 2011-09-05 ENCOUNTER — Ambulatory Visit
Admission: RE | Admit: 2011-09-05 | Discharge: 2011-09-05 | Disposition: A | Discharge status: Home / Self Care | Payer: 59 | Source: Ambulatory Visit | Attending: Family Medicine | Admitting: Family Medicine

## 2011-09-05 DIAGNOSIS — E041 Nontoxic single thyroid nodule: Secondary | ICD-10-CM

## 2011-09-06 ENCOUNTER — Ambulatory Visit: Payer: 59 | Admitting: Physical Therapy

## 2011-09-08 ENCOUNTER — Ambulatory Visit: Payer: 59 | Attending: Family Medicine | Admitting: Physical Therapy

## 2011-09-08 DIAGNOSIS — IMO0001 Reserved for inherently not codable concepts without codable children: Secondary | ICD-10-CM | POA: Insufficient documentation

## 2011-09-08 DIAGNOSIS — M25569 Pain in unspecified knee: Secondary | ICD-10-CM | POA: Insufficient documentation

## 2011-09-08 DIAGNOSIS — M25669 Stiffness of unspecified knee, not elsewhere classified: Secondary | ICD-10-CM | POA: Insufficient documentation

## 2011-09-13 ENCOUNTER — Ambulatory Visit: Payer: 59 | Admitting: Physical Therapy

## 2011-09-15 ENCOUNTER — Other Ambulatory Visit (HOSPITAL_COMMUNITY)
Admission: RE | Admit: 2011-09-15 | Discharge: 2011-09-15 | Disposition: A | Discharge status: Home / Self Care | Payer: 59 | Source: Ambulatory Visit | Attending: Interventional Radiology | Admitting: Interventional Radiology

## 2011-09-15 ENCOUNTER — Ambulatory Visit
Admission: RE | Admit: 2011-09-15 | Discharge: 2011-09-15 | Disposition: A | Discharge status: Home / Self Care | Payer: 59 | Source: Ambulatory Visit | Attending: Family Medicine | Admitting: Family Medicine

## 2011-09-15 ENCOUNTER — Ambulatory Visit: Payer: 59 | Admitting: Physical Therapy

## 2011-09-15 DIAGNOSIS — E049 Nontoxic goiter, unspecified: Secondary | ICD-10-CM | POA: Insufficient documentation

## 2011-09-15 DIAGNOSIS — E041 Nontoxic single thyroid nodule: Secondary | ICD-10-CM

## 2011-09-20 ENCOUNTER — Ambulatory Visit: Payer: 59 | Admitting: Physical Therapy

## 2011-09-22 ENCOUNTER — Ambulatory Visit: Payer: 59 | Admitting: Physical Therapy

## 2011-09-27 ENCOUNTER — Ambulatory Visit: Payer: 59 | Admitting: Physical Therapy

## 2011-09-29 ENCOUNTER — Ambulatory Visit: Payer: 59 | Admitting: Physical Therapy

## 2011-10-04 ENCOUNTER — Ambulatory Visit: Payer: 59 | Admitting: Physical Therapy

## 2011-10-06 ENCOUNTER — Encounter: Payer: 59 | Admitting: Rehabilitation

## 2011-10-11 ENCOUNTER — Ambulatory Visit: Payer: 59 | Attending: Family Medicine | Admitting: Rehabilitation

## 2011-10-11 DIAGNOSIS — M25569 Pain in unspecified knee: Secondary | ICD-10-CM | POA: Insufficient documentation

## 2011-10-11 DIAGNOSIS — IMO0001 Reserved for inherently not codable concepts without codable children: Secondary | ICD-10-CM | POA: Insufficient documentation

## 2011-10-11 DIAGNOSIS — M25669 Stiffness of unspecified knee, not elsewhere classified: Secondary | ICD-10-CM | POA: Insufficient documentation

## 2011-10-13 ENCOUNTER — Ambulatory Visit: Payer: 59 | Admitting: Rehabilitation

## 2011-10-18 ENCOUNTER — Encounter: Payer: 59 | Admitting: Rehabilitation

## 2011-10-20 ENCOUNTER — Ambulatory Visit: Payer: 59 | Admitting: Rehabilitation

## 2011-10-25 ENCOUNTER — Ambulatory Visit: Payer: 59 | Admitting: Rehabilitation

## 2011-10-27 ENCOUNTER — Ambulatory Visit: Payer: 59 | Admitting: Physical Therapy

## 2013-04-15 ENCOUNTER — Other Ambulatory Visit: Payer: Self-pay | Admitting: Cardiology

## 2013-06-21 ENCOUNTER — Encounter: Payer: Self-pay | Admitting: General Surgery

## 2013-06-21 DIAGNOSIS — I10 Essential (primary) hypertension: Secondary | ICD-10-CM

## 2013-06-21 DIAGNOSIS — E7800 Pure hypercholesterolemia, unspecified: Secondary | ICD-10-CM | POA: Insufficient documentation

## 2013-06-21 DIAGNOSIS — Z79899 Other long term (current) drug therapy: Secondary | ICD-10-CM

## 2013-06-21 DIAGNOSIS — I2510 Atherosclerotic heart disease of native coronary artery without angina pectoris: Secondary | ICD-10-CM | POA: Insufficient documentation

## 2013-06-24 DIAGNOSIS — L669 Cicatricial alopecia, unspecified: Secondary | ICD-10-CM | POA: Insufficient documentation

## 2013-07-12 ENCOUNTER — Encounter: Payer: Self-pay | Admitting: Cardiology

## 2013-07-12 ENCOUNTER — Ambulatory Visit (INDEPENDENT_AMBULATORY_CARE_PROVIDER_SITE_OTHER): Payer: 59 | Admitting: Cardiology

## 2013-07-12 VITALS — BP 140/90 | HR 92 | Ht 64.5 in | Wt 271.0 lb

## 2013-07-12 DIAGNOSIS — E7800 Pure hypercholesterolemia, unspecified: Secondary | ICD-10-CM

## 2013-07-12 DIAGNOSIS — I2510 Atherosclerotic heart disease of native coronary artery without angina pectoris: Secondary | ICD-10-CM

## 2013-07-12 DIAGNOSIS — I10 Essential (primary) hypertension: Secondary | ICD-10-CM

## 2013-07-12 MED ORDER — ASPIRIN EC 81 MG PO TBEC: 81.0000 mg | DELAYED_RELEASE_TABLET | Freq: Every day | ORAL | Status: DC

## 2013-07-12 NOTE — Progress Notes (Addendum)
[address] [place], [place]  [address] Phone: [phone] Fax:  [phone]  Date:  07/12/2013   ID:  AFSA BRUNKEN, DOB 09/19/1950, MRN [id number]  PCP:  Gweneth Dimitri, MD  Cardiologist:  Armanda Magic ,MD     History of Present Illness: This is a 63yo WF with a history of HTN, ASCAD s/p PCI of the RCA in 2005 and dyslipidemia who presents today for followup.  She is doing well.  She denies any chest pain, SOB, DOE, dizziness, palpitations or syncope.  She has chronic LE edema during the day.    Wt Readings from Last 3 Encounters:  07/12/13 271 lb (122.925 kg)     Past Medical History  Diagnosis Date  . Diabetes mellitus without complication     type 2  . Hyperlipidemia   . Hypertension   . Coronary artery disease 2005    S/P PCI of RCA  . Obesity   . Thyroid nodule     S/P bx 8/13 c/w goiter    Current Outpatient Prescriptions  Medication Sig Dispense Refill  . acetaminophen (TYLENOL) 650 MG CR tablet Take 650 mg by mouth every 8 (eight) hours as needed. Pain      . amLODipine-benazepril (LOTREL) 5-10 MG per capsule Take 1 capsule by mouth daily.      Marland Kitchen aspirin 325 MG tablet Take 325 mg by mouth daily.      . Cholecalciferol (VITAMIN D3) 2000 UNITS capsule Take 2,000 Units by mouth daily.      . clopidogrel (PLAVIX) 75 MG tablet Take 75 mg by mouth daily.      . pioglitazone (ACTOS) 15 MG tablet Take 15 mg by mouth daily.      . simvastatin (ZOCOR) 10 MG tablet Take 1 tablet (10 mg total) by mouth daily at 6 PM.  30 tablet  3   No current facility-administered medications for this visit.    Allergies:    Allergies  Allergen Reactions  . Crestor [Rosuvastatin]     constipation    Social History:  The patient  reports that she has never smoked. She does not have any smokeless tobacco history on file. She reports that she drinks alcohol. She reports that she does not use illicit drugs.   Family History:  The patient's family history is not on  file.   ROS:  Please see the history of present illness.      All other systems reviewed and negative.   PHYSICAL EXAM: VS:  BP 140/90  Pulse 92  Ht 5' 4.5" (1.638 m)  Wt 271 lb (122.925 kg)  BMI 45.82 kg/m2 Well nourished, well developed, in no acute distress HEENT: normal Neck: no JVD Cardiac:  normal S1, S2; RRR; no murmur Lungs:  clear to auscultation bilaterally, no wheezing, rhonchi or rales Abd: soft, nontender, no hepatomegaly Ext: no edema Skin: warm and dry Neuro:  CNs 2-12 intact, no focal abnormalities noted EKG:  NSR with nonspecific ST abnormality   ASSESSMENT AND PLAN:  1.  ASCAD s/p remote PCI of the RCA in 2005. - decrease ASA to 81mg  daily - continue Plavix 2.  HTN - mildly elevated - continue Lotrel - check BP daily for a week and call with results 3.  Dyslipidemia - LDL still not at goal - checked last month and was 180.  She is statin intolerant - I will get her back into lipid clinic  Followup with me in 1 year   Signed,  Armanda Magicraci Turner, MD 07/12/2013 9:57 AM

## 2013-07-12 NOTE — Patient Instructions (Signed)
Your physician has recommended you make the following change in your medication:  DECREASE ASPIRIN TO 81 MG ONCE A DAY  Check your blood pressure and heart rate once a daily (at the same time each day) for a week and call Dr. Mayford Knife with results.  Please schedule an appointment with the Lipid Clinic.  Your physician wants you to follow-up in: 1 YEAR WITH DR TURNER.   You will receive a reminder letter in the mail two months in advance. If you don't receive a letter, please call our office to schedule the follow-up appointment.

## 2013-07-31 ENCOUNTER — Other Ambulatory Visit: Payer: Self-pay | Admitting: Cardiology

## 2014-04-17 ENCOUNTER — Encounter: Payer: Self-pay | Admitting: Cardiology

## 2014-06-17 ENCOUNTER — Other Ambulatory Visit: Payer: Self-pay | Admitting: Family Medicine

## 2014-06-17 DIAGNOSIS — Z1231 Encounter for screening mammogram for malignant neoplasm of breast: Secondary | ICD-10-CM

## 2014-06-20 ENCOUNTER — Encounter: Payer: Self-pay | Admitting: Cardiology

## 2014-06-24 ENCOUNTER — Ambulatory Visit
Admission: RE | Admit: 2014-06-24 | Discharge: 2014-06-24 | Disposition: A | Discharge status: Home / Self Care | Payer: 59 | Source: Ambulatory Visit | Attending: Family Medicine | Admitting: Family Medicine

## 2014-06-24 DIAGNOSIS — Z1231 Encounter for screening mammogram for malignant neoplasm of breast: Secondary | ICD-10-CM

## 2014-06-30 ENCOUNTER — Encounter: Payer: Self-pay | Admitting: Cardiology

## 2014-06-30 ENCOUNTER — Ambulatory Visit (INDEPENDENT_AMBULATORY_CARE_PROVIDER_SITE_OTHER): Payer: 59 | Admitting: Cardiology

## 2014-06-30 VITALS — BP 140/82 | HR 80 | Ht 64.5 in | Wt 262.2 lb

## 2014-06-30 DIAGNOSIS — E7800 Pure hypercholesterolemia, unspecified: Secondary | ICD-10-CM

## 2014-06-30 DIAGNOSIS — I10 Essential (primary) hypertension: Secondary | ICD-10-CM

## 2014-06-30 DIAGNOSIS — I2510 Atherosclerotic heart disease of native coronary artery without angina pectoris: Secondary | ICD-10-CM

## 2014-06-30 LAB — LIPID PANEL
CHOLESTEROL: 181 mg/dL (ref 0–200)
HDL: 48.6 mg/dL (ref >39.00)
LDL Cholesterol: 116 mg/dL — ABNORMAL HIGH (ref 0–99)
NONHDL: 132.4
Total CHOL/HDL Ratio: 4
Triglycerides: 84 mg/dL (ref 0.0–149.0)
VLDL: 16.8 mg/dL (ref 0.0–40.0)

## 2014-06-30 LAB — HEPATIC FUNCTION PANEL
ALK PHOS: 109 U/L (ref 39–117)
ALT: 9 U/L (ref 0–35)
AST: 13 U/L (ref 0–37)
Albumin: 3.9 g/dL (ref 3.5–5.2)
BILIRUBIN TOTAL: 0.8 mg/dL (ref 0.2–1.2)
Bilirubin, Direct: 0.2 mg/dL (ref 0.0–0.3)
Total Protein: 7.7 g/dL (ref 6.0–8.3)

## 2014-06-30 NOTE — Patient Instructions (Signed)
Medication Instructions:  Your physician has recommended you make the following change in your medication: 1) STOP PLAVIX  Labwork: TODAY: LFTs, Lipids  Testing/Procedures: None  Follow-Up: You have been referred to LIPID CLINIC.  Your physician wants you to follow-up in: 1 year with Dr. Mayford Knifeurner. You will receive a reminder letter in the mail two months in advance. If you don't receive a letter, please call our office to schedule the follow-up appointment.   Any Other Special Instructions Will Be Listed Below (If Applicable).

## 2014-06-30 NOTE — Progress Notes (Signed)
Cardiology Office Note   Date:  06/30/2014   ID:  Angela Morrison, DOB 1950-02-22, MRN [id number]  PCP:  Gweneth DimitriMCNEILL,WENDY, MD  Cardiologist:   Quintella ReichertURNER,Larita Deremer R, MD   Chief Complaint  Patient presents with  . Follow-up    Coronary Atherosclerosis of native coronary artery      History of Present Illness: This is a 64yo WF with a history of HTN, ASCAD s/p PCI of the RCA in 2005 and dyslipidemia who presents today for followup. She is doing well. She denies any chest pain, SOB, DOE, dizziness, palpitations or syncope. She has chronic LE edema during the day.     Past Medical History  Diagnosis Date  . Diabetes mellitus without complication     type 2  . Hyperlipidemia   . Hypertension   . Coronary artery disease 2005    S/P PCI of RCA  . Obesity   . Thyroid nodule     S/P bx 8/13 c/w goiter    History reviewed. No pertinent past surgical history.   Current Outpatient Prescriptions  Medication Sig Dispense Refill  . acetaminophen (TYLENOL) 650 MG CR tablet Take 650 mg by mouth every 8 (eight) hours as needed. Pain    . amLODipine-benazepril (LOTREL) 5-10 MG per capsule Take 1 capsule by mouth daily.    Marland Kitchen. aspirin EC 81 MG tablet Take 1 tablet (81 mg total) by mouth daily. 90 tablet 3  . Cholecalciferol (VITAMIN D3) 2000 UNITS capsule Take 2,000 Units by mouth 3 (three) times a week.     . clopidogrel (PLAVIX) 75 MG tablet take 1 tablet by mouth once daily 30 tablet 11  . pioglitazone (ACTOS) 15 MG tablet Take 15 mg by mouth daily.    . RESTASIS 0.05 % ophthalmic emulsion Place 1 drop into both eyes 2 (two) times daily.  0  . simvastatin (ZOCOR) 10 MG tablet Take 1 tablet (10 mg total) by mouth daily at 6 PM. 30 tablet 3   No current facility-administered medications for this visit.    Allergies:   Crestor    Social History:  The patient  reports that she has never smoked. She does not have any smokeless tobacco history on file. She reports that she drinks alcohol.  She reports that she does not use illicit drugs.   Family History:  The patient's family history includes Alzheimer's disease in her mother; Dementia in her father.    ROS:  Please see the history of present illness.   Otherwise, review of systems are positive for none.   All other systems are reviewed and negative.    PHYSICAL EXAM: VS:  BP 140/82 mmHg  Pulse 80  Ht 5' 4.5" (1.638 m)  Wt 262 lb 3.2 oz (118.933 kg)  BMI 44.33 kg/m2 , BMI Body mass index is 44.33 kg/(m^2). GEN: Well nourished, well developed, in no acute distress HEENT: normal Neck: no JVD, carotid bruits, or masses Cardiac: RRR; no murmurs, rubs, or gallops,no edema  Respiratory:  clear to auscultation bilaterally, normal work of breathing GI: soft, nontender, nondistended, + BS MS: no deformity or atrophy Skin: warm and dry, no rash Neuro:  Strength and sensation are intact Psych: euthymic mood, full affect   EKG:  EKG was ordered today and showed NSR with LAE    Recent Labs: No results found for requested labs within last 365 days.    Lipid Panel No results found for: CHOL, TRIG, HDL, CHOLHDL, VLDL, LDLCALC, LDLDIRECT  Wt Readings from Last 3 Encounters:  06/30/14 262 lb 3.2 oz (118.933 kg)  07/12/13 271 lb (122.925 kg)    ASSESSMENT AND PLAN:  1. ASCAD s/p remote PCI of the RCA in 2005. - continue ASA - we will stop Plavix since her PCI was in 2005 2. HTN - controlled - continue Lotrel 3. Dyslipidemia -  She is statin intolerant.  I will check her FLP and ALT today and refer to lipid clinic.  She may be a candidate for [facility] 9 drug.      Current medicines are reviewed at length with the patient today.  The patient does not have concerns regarding medicines.  The following changes have been made:  no change  Labs/ tests ordered today include: none  No orders of the defined types were placed in this encounter.     Disposition:   FU with me in 1 year  Signed, Quintella Reichert, MD    06/30/2014 8:37 AM    [facility] [address] [place], [place], [place]  [address] Phone: [phone]; Fax: [phone]

## 2014-07-08 ENCOUNTER — Ambulatory Visit: Payer: 59 | Admitting: Pharmacist

## 2014-07-18 ENCOUNTER — Ambulatory Visit: Payer: Self-pay | Admitting: Cardiology

## 2014-10-15 ENCOUNTER — Telehealth: Payer: Self-pay | Admitting: Cardiology

## 2014-10-15 NOTE — Telephone Encounter (Signed)
Per Dr. Mayford Knife, encouraged patient to schedule appointment with PCP. Patient agrees with treatment plan.

## 2014-10-15 NOTE — Telephone Encounter (Signed)
Patient c/o "nerve aches" in her two middle fingers on her L hand for one week. Today, she started experiencing L arm discomfort as well she calls a dull ache. Patient has no other complaints - no SOB or CP. She has been taking her medications as directed. She has no VS to report. Instructed patient to call her PCP, and that she will be called if Dr. Mayford Knife has further recommendations.   To Dr. Mayford Knife.

## 2014-10-15 NOTE — Telephone Encounter (Signed)
Agree with PCP OV 

## 2014-10-15 NOTE — Telephone Encounter (Signed)
New message      Pt is having tingling in hand and middle fingers for about 1-2 weeks.  She is also having discomfort in left arm.  Pt denies chest pain and sob. Please advise

## 2015-08-05 DIAGNOSIS — E1159 Type 2 diabetes mellitus with other circulatory complications: Secondary | ICD-10-CM | POA: Diagnosis not present

## 2015-08-05 DIAGNOSIS — E782 Mixed hyperlipidemia: Secondary | ICD-10-CM | POA: Diagnosis not present

## 2015-08-05 DIAGNOSIS — M1712 Unilateral primary osteoarthritis, left knee: Secondary | ICD-10-CM | POA: Diagnosis not present

## 2015-08-05 DIAGNOSIS — E669 Obesity, unspecified: Secondary | ICD-10-CM | POA: Diagnosis not present

## 2015-08-05 DIAGNOSIS — Z6841 Body Mass Index (BMI) 40.0 and over, adult: Secondary | ICD-10-CM | POA: Diagnosis not present

## 2015-08-05 DIAGNOSIS — Z23 Encounter for immunization: Secondary | ICD-10-CM | POA: Diagnosis not present

## 2015-08-05 DIAGNOSIS — I2510 Atherosclerotic heart disease of native coronary artery without angina pectoris: Secondary | ICD-10-CM | POA: Diagnosis not present

## 2015-08-05 DIAGNOSIS — E559 Vitamin D deficiency, unspecified: Secondary | ICD-10-CM | POA: Diagnosis not present

## 2015-08-05 DIAGNOSIS — I10 Essential (primary) hypertension: Secondary | ICD-10-CM | POA: Diagnosis not present

## 2016-01-22 DIAGNOSIS — J329 Chronic sinusitis, unspecified: Secondary | ICD-10-CM | POA: Diagnosis not present

## 2016-01-22 DIAGNOSIS — J209 Acute bronchitis, unspecified: Secondary | ICD-10-CM | POA: Diagnosis not present

## 2016-01-27 DIAGNOSIS — E669 Obesity, unspecified: Secondary | ICD-10-CM | POA: Diagnosis not present

## 2016-01-27 DIAGNOSIS — I10 Essential (primary) hypertension: Secondary | ICD-10-CM | POA: Diagnosis not present

## 2016-01-27 DIAGNOSIS — Z6841 Body Mass Index (BMI) 40.0 and over, adult: Secondary | ICD-10-CM | POA: Diagnosis not present

## 2016-01-27 DIAGNOSIS — I2510 Atherosclerotic heart disease of native coronary artery without angina pectoris: Secondary | ICD-10-CM | POA: Diagnosis not present

## 2016-01-27 DIAGNOSIS — J209 Acute bronchitis, unspecified: Secondary | ICD-10-CM | POA: Diagnosis not present

## 2016-01-27 DIAGNOSIS — E782 Mixed hyperlipidemia: Secondary | ICD-10-CM | POA: Diagnosis not present

## 2016-01-27 DIAGNOSIS — E119 Type 2 diabetes mellitus without complications: Secondary | ICD-10-CM | POA: Diagnosis not present

## 2016-01-27 DIAGNOSIS — E559 Vitamin D deficiency, unspecified: Secondary | ICD-10-CM | POA: Diagnosis not present

## 2016-01-27 DIAGNOSIS — Z8639 Personal history of other endocrine, nutritional and metabolic disease: Secondary | ICD-10-CM | POA: Diagnosis not present

## 2016-01-27 DIAGNOSIS — G4710 Hypersomnia, unspecified: Secondary | ICD-10-CM | POA: Diagnosis not present

## 2016-01-29 ENCOUNTER — Other Ambulatory Visit: Payer: Self-pay | Admitting: Family Medicine

## 2016-01-29 DIAGNOSIS — Z1231 Encounter for screening mammogram for malignant neoplasm of breast: Secondary | ICD-10-CM

## 2016-02-23 ENCOUNTER — Ambulatory Visit: Payer: 59

## 2016-03-04 ENCOUNTER — Ambulatory Visit
Admission: RE | Admit: 2016-03-04 | Discharge: 2016-03-04 | Disposition: A | Discharge status: Home / Self Care | Payer: Medicare Other | Source: Ambulatory Visit | Attending: Family Medicine | Admitting: Family Medicine

## 2016-03-04 DIAGNOSIS — Z1231 Encounter for screening mammogram for malignant neoplasm of breast: Secondary | ICD-10-CM | POA: Diagnosis not present

## 2016-03-04 DIAGNOSIS — E559 Vitamin D deficiency, unspecified: Secondary | ICD-10-CM | POA: Diagnosis not present

## 2016-03-04 DIAGNOSIS — E1159 Type 2 diabetes mellitus with other circulatory complications: Secondary | ICD-10-CM | POA: Diagnosis not present

## 2016-03-04 DIAGNOSIS — M1712 Unilateral primary osteoarthritis, left knee: Secondary | ICD-10-CM | POA: Diagnosis not present

## 2016-03-04 DIAGNOSIS — I2510 Atherosclerotic heart disease of native coronary artery without angina pectoris: Secondary | ICD-10-CM | POA: Diagnosis not present

## 2016-03-04 DIAGNOSIS — E782 Mixed hyperlipidemia: Secondary | ICD-10-CM | POA: Diagnosis not present

## 2016-03-04 DIAGNOSIS — Z23 Encounter for immunization: Secondary | ICD-10-CM | POA: Diagnosis not present

## 2016-03-04 DIAGNOSIS — J209 Acute bronchitis, unspecified: Secondary | ICD-10-CM | POA: Diagnosis not present

## 2016-03-04 DIAGNOSIS — E669 Obesity, unspecified: Secondary | ICD-10-CM | POA: Diagnosis not present

## 2016-03-04 DIAGNOSIS — G4710 Hypersomnia, unspecified: Secondary | ICD-10-CM | POA: Diagnosis not present

## 2016-03-04 DIAGNOSIS — I10 Essential (primary) hypertension: Secondary | ICD-10-CM | POA: Diagnosis not present

## 2016-03-04 DIAGNOSIS — E119 Type 2 diabetes mellitus without complications: Secondary | ICD-10-CM | POA: Diagnosis not present

## 2016-03-16 ENCOUNTER — Other Ambulatory Visit: Payer: Self-pay | Admitting: Family Medicine

## 2016-03-16 ENCOUNTER — Other Ambulatory Visit (HOSPITAL_COMMUNITY)
Admission: RE | Admit: 2016-03-16 | Discharge: 2016-03-16 | Disposition: A | Discharge status: Home / Self Care | Payer: Medicare Other | Source: Ambulatory Visit | Attending: Family Medicine | Admitting: Family Medicine

## 2016-03-16 DIAGNOSIS — Z1159 Encounter for screening for other viral diseases: Secondary | ICD-10-CM | POA: Diagnosis not present

## 2016-03-16 DIAGNOSIS — N959 Unspecified menopausal and perimenopausal disorder: Secondary | ICD-10-CM | POA: Diagnosis not present

## 2016-03-16 DIAGNOSIS — Z1211 Encounter for screening for malignant neoplasm of colon: Secondary | ICD-10-CM | POA: Diagnosis not present

## 2016-03-16 DIAGNOSIS — Z23 Encounter for immunization: Secondary | ICD-10-CM | POA: Diagnosis not present

## 2016-03-16 DIAGNOSIS — Z7189 Other specified counseling: Secondary | ICD-10-CM | POA: Diagnosis not present

## 2016-03-16 DIAGNOSIS — Z0000 Encounter for general adult medical examination without abnormal findings: Secondary | ICD-10-CM | POA: Diagnosis not present

## 2016-03-16 DIAGNOSIS — Z01419 Encounter for gynecological examination (general) (routine) without abnormal findings: Secondary | ICD-10-CM | POA: Diagnosis not present

## 2016-03-16 DIAGNOSIS — Z124 Encounter for screening for malignant neoplasm of cervix: Secondary | ICD-10-CM | POA: Diagnosis present

## 2016-03-18 LAB — CYTOLOGY - PAP: Diagnosis: NEGATIVE

## 2016-05-03 DIAGNOSIS — M8588 Other specified disorders of bone density and structure, other site: Secondary | ICD-10-CM | POA: Diagnosis not present

## 2016-05-03 DIAGNOSIS — Z780 Asymptomatic menopausal state: Secondary | ICD-10-CM | POA: Diagnosis not present

## 2016-05-05 ENCOUNTER — Other Ambulatory Visit: Payer: Self-pay | Admitting: Gastroenterology

## 2016-05-13 ENCOUNTER — Encounter (HOSPITAL_COMMUNITY): Payer: Self-pay | Admitting: *Deleted

## 2016-05-17 ENCOUNTER — Ambulatory Visit (HOSPITAL_COMMUNITY): Payer: Medicare Other | Admitting: Anesthesiology

## 2016-05-17 ENCOUNTER — Ambulatory Visit (HOSPITAL_COMMUNITY)
Admission: RE | Admit: 2016-05-17 | Discharge: 2016-05-17 | Disposition: A | Discharge status: Home / Self Care | Payer: Medicare Other | Source: Ambulatory Visit | Attending: Gastroenterology | Admitting: Gastroenterology

## 2016-05-17 ENCOUNTER — Encounter (HOSPITAL_COMMUNITY): Payer: Self-pay

## 2016-05-17 ENCOUNTER — Encounter (HOSPITAL_COMMUNITY)
Admission: RE | Disposition: A | Discharge status: Home / Self Care | Payer: Self-pay | Source: Ambulatory Visit | Attending: Gastroenterology

## 2016-05-17 DIAGNOSIS — Z1211 Encounter for screening for malignant neoplasm of colon: Secondary | ICD-10-CM | POA: Diagnosis not present

## 2016-05-17 DIAGNOSIS — I10 Essential (primary) hypertension: Secondary | ICD-10-CM | POA: Diagnosis not present

## 2016-05-17 DIAGNOSIS — Z955 Presence of coronary angioplasty implant and graft: Secondary | ICD-10-CM | POA: Diagnosis not present

## 2016-05-17 DIAGNOSIS — I2510 Atherosclerotic heart disease of native coronary artery without angina pectoris: Secondary | ICD-10-CM | POA: Diagnosis not present

## 2016-05-17 DIAGNOSIS — M1990 Unspecified osteoarthritis, unspecified site: Secondary | ICD-10-CM | POA: Diagnosis not present

## 2016-05-17 DIAGNOSIS — Z880 Allergy status to penicillin: Secondary | ICD-10-CM | POA: Insufficient documentation

## 2016-05-17 DIAGNOSIS — E7800 Pure hypercholesterolemia, unspecified: Secondary | ICD-10-CM | POA: Diagnosis not present

## 2016-05-17 DIAGNOSIS — E119 Type 2 diabetes mellitus without complications: Secondary | ICD-10-CM | POA: Insufficient documentation

## 2016-05-17 DIAGNOSIS — F329 Major depressive disorder, single episode, unspecified: Secondary | ICD-10-CM | POA: Insufficient documentation

## 2016-05-17 HISTORY — DX: Other specified postprocedural states: R11.2

## 2016-05-17 HISTORY — DX: Other specified postprocedural states: Z98.890

## 2016-05-17 HISTORY — DX: Unspecified osteoarthritis, unspecified site: M19.90

## 2016-05-17 HISTORY — DX: Major depressive disorder, single episode, unspecified: F32.9

## 2016-05-17 HISTORY — DX: Anemia, unspecified: D64.9

## 2016-05-17 HISTORY — DX: Depression, unspecified: F32.A

## 2016-05-17 HISTORY — PX: COLONOSCOPY WITH PROPOFOL: SHX5780

## 2016-05-17 LAB — GLUCOSE, CAPILLARY: GLUCOSE-CAPILLARY: 118 mg/dL — AB (ref 65–99)

## 2016-05-17 SURGERY — COLONOSCOPY WITH PROPOFOL
Anesthesia: Monitor Anesthesia Care

## 2016-05-17 MED ORDER — PROPOFOL 500 MG/50ML IV EMUL
INTRAVENOUS | Status: DC | PRN
  Administered 2016-05-17: 300 ug/kg/min via INTRAVENOUS

## 2016-05-17 MED ORDER — SODIUM CHLORIDE 0.9 % IV SOLN: INTRAVENOUS | Status: DC

## 2016-05-17 MED ORDER — PROPOFOL 10 MG/ML IV BOLUS
INTRAVENOUS | Status: AC
  Filled 2016-05-17: qty 40

## 2016-05-17 MED ORDER — ONDANSETRON HCL 4 MG/2ML IJ SOLN
INTRAMUSCULAR | Status: DC | PRN
  Administered 2016-05-17: 4 mg via INTRAVENOUS

## 2016-05-17 MED ORDER — LACTATED RINGERS IV SOLN
INTRAVENOUS | Status: DC
  Administered 2016-05-17: 09:00:00 via INTRAVENOUS

## 2016-05-17 MED ORDER — ONDANSETRON HCL 4 MG/2ML IJ SOLN
INTRAMUSCULAR | Status: AC
  Filled 2016-05-17: qty 2

## 2016-05-17 SURGICAL SUPPLY — 22 items

## 2016-05-17 NOTE — H&P (Signed)
Procedure: Baseline screening colonoscopy  History: The patient is a 66 year old female born [birth year]. She is scheduled to undergo her first screening colonoscopy with polypectomy to prevent colon cancer.  Medication allergies: Penicillin  Past medical history: Lumbar disc surgery. Appendectomy. Drug eluting coronary artery stent placement in 2005. Type 2 diabetes mellitus. Hypertension. Hypercholesterolemia. Coronary artery disease. Thyroid nodule.  Exam: The patient is alert and lying comfortably on the endoscopy stretcher. Abdomen is soft and nontender to palpation. Lungs are clear to auscultation. Cardiac exam reveals a regular rhythm.  Plan: Proceed with screening colonoscopy

## 2016-05-17 NOTE — Anesthesia Preprocedure Evaluation (Signed)
Anesthesia Evaluation  Patient identified by MRN, date of birth, ID band Patient awake    Reviewed: Allergy & Precautions, H&P , NPO status , Patient's Chart, lab work & pertinent test results  History of Anesthesia Complications (+) PONV and history of anesthetic complications  Airway Mallampati: II   Neck ROM: full    Dental   Pulmonary neg pulmonary ROS,    breath sounds clear to auscultation       Cardiovascular hypertension, + CAD   Rhythm:regular Rate:Normal  s/p PCI to RCA   Neuro/Psych PSYCHIATRIC DISORDERS Depression    GI/Hepatic   Endo/Other  diabetes, Type 2  Renal/GU      Musculoskeletal  (+) Arthritis ,   Abdominal   Peds  Hematology   Anesthesia Other Findings   Reproductive/Obstetrics                             Anesthesia Physical Anesthesia Plan  ASA: III  Anesthesia Plan: MAC   Post-op Pain Management:    Induction: Intravenous  Airway Management Planned: Simple Face Mask  Additional Equipment:   Intra-op Plan:   Post-operative Plan:   Informed Consent: I have reviewed the patients History and Physical, chart, labs and discussed the procedure including the risks, benefits and alternatives for the proposed anesthesia with the patient or authorized representative who has indicated his/her understanding and acceptance.     Plan Discussed with: CRNA and Anesthesiologist  Anesthesia Plan Comments:         Anesthesia Quick Evaluation

## 2016-05-17 NOTE — Transfer of Care (Signed)
Immediate Anesthesia Transfer of Care Note  Patient: Angela Morrison  Procedure(s) Performed: Procedure(s): COLONOSCOPY WITH PROPOFOL (N/A)  Patient Location: PACU  Anesthesia Type:MAC  Level of Consciousness: awake, alert , oriented and patient cooperative  Airway & Oxygen Therapy: Patient Spontanous Breathing and Patient connected to face mask oxygen  Post-op Assessment: Report given to RN, Post -op Vital signs reviewed and stable and Patient moving all extremities X 4  Post vital signs: stable  Last Vitals:  Vitals:   05/17/16 0825  BP: (!) 208/100  Pulse: (!) 109  Resp: (!) 25  Temp: 36.7 C    Last Pain:  Vitals:   05/17/16 0825  TempSrc: Oral         Complications: No apparent anesthesia complications

## 2016-05-17 NOTE — Anesthesia Postprocedure Evaluation (Signed)
Anesthesia Post Note  Patient: Angela Morrison  Procedure(s) Performed: Procedure(s) (LRB): COLONOSCOPY WITH PROPOFOL (N/A)  Patient location during evaluation: PACU Anesthesia Type: MAC Level of consciousness: awake and alert Pain management: pain level controlled Vital Signs Assessment: post-procedure vital signs reviewed and stable Respiratory status: spontaneous breathing, nonlabored ventilation, respiratory function stable and patient connected to nasal cannula oxygen Cardiovascular status: stable and blood pressure returned to baseline Anesthetic complications: no       Last Vitals:  Vitals:   05/17/16 0940 05/17/16 0950  BP: 123/67 123/87  Pulse: 69 66  Resp: 15 16  Temp:      Last Pain:  Vitals:   05/17/16 0914  TempSrc: Oral                 Tonesha Tsou S

## 2016-05-17 NOTE — Discharge Instructions (Signed)

## 2016-05-17 NOTE — Op Note (Signed)
[facility] Patient Name: Angela Morrison Procedure Date: 05/17/2016 MRN: [id number] Attending MD: Charolett Bumpers , MD Date of Birth: 1950/05/02 CSN: [id number] Age: 66 Admit Type: Outpatient Procedure:                Colonoscopy Indications:              Screening for colorectal malignant neoplasm Providers:                Charolett Bumpers, MD, Norman Clay, RN, Kandice Robinsons, Technician Referring MD:              Medicines:                Propofol per Anesthesia Complications:            No immediate complications. Estimated Blood Loss:     Estimated blood loss: none. Procedure:                Pre-Anesthesia Assessment:                           - Prior to the procedure, a History and Physical                            was performed, and patient medications and                            allergies were reviewed. The patient's tolerance of                            previous anesthesia was also reviewed. The risks                            and benefits of the procedure and the sedation                            options and risks were discussed with the patient.                            All questions were answered, and informed consent                            was obtained. Prior Anticoagulants: The patient has                            taken aspirin, last dose was day of procedure. ASA                            Grade Assessment: II - A patient with mild systemic                            disease. After reviewing the risks and benefits,  the patient was deemed in satisfactory condition to                            undergo the procedure.                           After obtaining informed consent, the colonoscope                            was passed under direct vision. Throughout the                            procedure, the patient's blood pressure, pulse, and                            oxygen  saturations were monitored continuously. The                            was introduced through the anus and advanced to the                            the cecum, identified by appendiceal orifice and                            ileocecal valve. The colonoscopy was performed                            without difficulty. The patient tolerated the                            procedure well. The quality of the bowel                            preparation was good. The appendiceal orifice and                            the rectum were photographed. Scope In: 8:50:47 AM Scope Out: 9:06:17 AM Scope Withdrawal Time: 0 hours 7 minutes 18 seconds  Total Procedure Duration: 0 hours 15 minutes 30 seconds  Findings:      The perianal and digital rectal examinations were normal.      The entire examined colon appeared normal. Impression:               - The entire examined colon is normal.                           - No specimens collected. Moderate Sedation:      N/A- Per Anesthesia Care Recommendation:           - Patient has a contact number available for                            emergencies. The signs and symptoms of potential  delayed complications were discussed with the                            patient. Return to normal activities tomorrow.                            Written discharge instructions were provided to the                            patient.                           - Repeat colonoscopy in 10 years for screening                            purposes.                           - Resume previous diet.                           - Continue present medications. Procedure Code(s):        --- Professional ---                           Z6109, Colorectal cancer screening; colonoscopy on                            individual not meeting criteria for high risk Diagnosis Code(s):        --- Professional ---                           Z12.11, Encounter for  screening for malignant                            neoplasm of colon CPT copyright 2016 [employer]. All rights reserved. The codes documented in this report are preliminary and upon coder review may  be revised to meet current compliance requirements. Danise Edge, MD Charolett Bumpers, MD 05/17/2016 9:11:42 AM This report has been signed electronically. Number of Addenda: 0

## 2016-06-15 DIAGNOSIS — R05 Cough: Secondary | ICD-10-CM | POA: Diagnosis not present

## 2017-04-04 ENCOUNTER — Other Ambulatory Visit: Payer: Self-pay | Admitting: Family Medicine

## 2017-04-04 DIAGNOSIS — E559 Vitamin D deficiency, unspecified: Secondary | ICD-10-CM | POA: Diagnosis not present

## 2017-04-04 DIAGNOSIS — E1159 Type 2 diabetes mellitus with other circulatory complications: Secondary | ICD-10-CM | POA: Diagnosis not present

## 2017-04-04 DIAGNOSIS — Z1231 Encounter for screening mammogram for malignant neoplasm of breast: Secondary | ICD-10-CM

## 2017-04-04 DIAGNOSIS — Z1389 Encounter for screening for other disorder: Secondary | ICD-10-CM | POA: Diagnosis not present

## 2017-04-04 DIAGNOSIS — I2510 Atherosclerotic heart disease of native coronary artery without angina pectoris: Secondary | ICD-10-CM | POA: Diagnosis not present

## 2017-04-04 DIAGNOSIS — Z1159 Encounter for screening for other viral diseases: Secondary | ICD-10-CM | POA: Diagnosis not present

## 2017-04-04 DIAGNOSIS — E669 Obesity, unspecified: Secondary | ICD-10-CM | POA: Diagnosis not present

## 2017-04-04 DIAGNOSIS — G479 Sleep disorder, unspecified: Secondary | ICD-10-CM | POA: Diagnosis not present

## 2017-04-04 DIAGNOSIS — E782 Mixed hyperlipidemia: Secondary | ICD-10-CM | POA: Diagnosis not present

## 2017-04-04 DIAGNOSIS — Z7189 Other specified counseling: Secondary | ICD-10-CM | POA: Diagnosis not present

## 2017-04-04 DIAGNOSIS — I10 Essential (primary) hypertension: Secondary | ICD-10-CM | POA: Diagnosis not present

## 2017-04-04 DIAGNOSIS — Z0000 Encounter for general adult medical examination without abnormal findings: Secondary | ICD-10-CM | POA: Diagnosis not present

## 2017-04-26 ENCOUNTER — Ambulatory Visit
Admission: RE | Admit: 2017-04-26 | Discharge: 2017-04-26 | Disposition: A | Discharge status: Home / Self Care | Payer: Medicare Other | Source: Ambulatory Visit | Attending: Family Medicine | Admitting: Family Medicine

## 2017-04-26 ENCOUNTER — Ambulatory Visit: Payer: 59

## 2017-04-26 DIAGNOSIS — Z1231 Encounter for screening mammogram for malignant neoplasm of breast: Secondary | ICD-10-CM | POA: Diagnosis not present

## 2017-05-22 ENCOUNTER — Ambulatory Visit (INDEPENDENT_AMBULATORY_CARE_PROVIDER_SITE_OTHER): Payer: Medicare Other | Admitting: Cardiology

## 2017-05-22 ENCOUNTER — Encounter: Payer: Self-pay | Admitting: Cardiology

## 2017-05-22 VITALS — BP 134/86 | HR 90 | Ht 64.5 in | Wt 260.0 lb

## 2017-05-22 DIAGNOSIS — I10 Essential (primary) hypertension: Secondary | ICD-10-CM | POA: Diagnosis not present

## 2017-05-22 DIAGNOSIS — I2510 Atherosclerotic heart disease of native coronary artery without angina pectoris: Secondary | ICD-10-CM | POA: Diagnosis not present

## 2017-05-22 DIAGNOSIS — E7800 Pure hypercholesterolemia, unspecified: Secondary | ICD-10-CM | POA: Diagnosis not present

## 2017-05-22 MED ORDER — SIMVASTATIN 20 MG PO TABS: 20.0000 mg | ORAL_TABLET | Freq: Every day | ORAL | 3 refills | Status: DC

## 2017-05-22 NOTE — Progress Notes (Addendum)
Cardiology Office Note:    Date:  05/22/2017   ID:  Angela Morrison, DOB 01-03-1951, MRN [id number]  PCP:  Gweneth Dimitri, MD  Cardiologist:  No primary care provider on file.    Referring MD: Gweneth Dimitri, MD   Chief Complaint  Patient presents with  . Coronary Artery Disease  . Hypertension  . Hyperlipidemia    History of Present Illness:    Angela Morrison is a 67 y.o. female with a hx of HTN, ASCAD s/p PCI of the RCA in 2005 and dyslipidemia.  She is here today for followup and is doing well.  She denies any chest pain or pressure, SOB, DOE, PND, orthopnea, dizziness, palpitations or syncope. Occasionally she will have some edema in her lower extremities. She says that intermittently she has some aching in her left arm that is very random and is nonexertional but also occurs when she is working out.  There are no associated symptoms. She is compliant with her meds and is tolerating meds with no SE.    Past Medical History:  Diagnosis Date  . Anemia    few yrs ago none recent  . Arthritis   . Coronary artery disease 2005   S/P PCI of RCA  . Depression   . Diabetes mellitus without complication (HCC)    type 2  . Hyperlipidemia   . Hypertension   . Obesity   . PONV (postoperative nausea and vomiting)   . Thyroid nodule    S/P bx 8/13 c/w goiter    Past Surgical History:  Procedure Laterality Date  . BACK SURGERY     lower back  . COLONOSCOPY WITH PROPOFOL N/A 05/17/2016   Procedure: COLONOSCOPY WITH PROPOFOL;  Surgeon: Charolett Bumpers, MD;  Location: [facility];  Service: Endoscopy;  Laterality: N/A;    Current Medications: Current Meds  Medication Sig  . amLODipine-benazepril (LOTREL) 5-10 MG per capsule Take 1 capsule by mouth at bedtime.   Marland Kitchen aspirin EC 81 MG tablet Take 1 tablet (81 mg total) by mouth daily. (Patient taking differently: Take 81 mg by mouth every evening. )  . Cholecalciferol (VITAMIN D3) 2000 UNITS capsule Take 2,000 Units by mouth  daily.   . fluticasone (FLONASE) 50 MCG/ACT nasal spray Place 1 spray into both nostrils daily as needed for allergies or rhinitis.  . naproxen sodium (ANAPROX) 220 MG tablet Take 220-440 mg by mouth daily as needed (pain).  . pioglitazone (ACTOS) 15 MG tablet Take 15 mg by mouth at bedtime.   Bertram Gala Glycol-Propyl Glycol (SYSTANE ULTRA OP) Apply 1 drop to eye daily.  . simvastatin (ZOCOR) 10 MG tablet Take 1 tablet (10 mg total) by mouth daily at 6 PM.     Allergies:   Crestor [rosuvastatin] and Penicillins   Social History   Socioeconomic History  . Marital status: Married    Spouse name: Not on file  . Number of children: Not on file  . Years of education: Not on file  . Highest education level: Not on file  Occupational History  . Not on file  Social Needs  . Financial resource strain: Not on file  . Food insecurity:    Worry: Not on file    Inability: Not on file  . Transportation needs:    Medical: Not on file    Non-medical: Not on file  Tobacco Use  . Smoking status: Never Smoker  . Smokeless tobacco: Never Used  Substance and Sexual Activity  . Alcohol  use: Yes    Comment: occ  . Drug use: No  . Sexual activity: Not on file  Lifestyle  . Physical activity:    Days per week: Not on file    Minutes per session: Not on file  . Stress: Not on file  Relationships  . Social connections:    Talks on phone: Not on file    Gets together: Not on file    Attends religious service: Not on file    Active member of club or organization: Not on file    Attends meetings of clubs or organizations: Not on file    Relationship status: Not on file  Other Topics Concern  . Not on file  Social History Narrative  . Not on file     Family History: The patient's family history includes Alzheimer's disease in her mother; Dementia in her father.  ROS:   Please see the history of present illness.    Review of Systems  Musculoskeletal: Positive for back pain.    Gastrointestinal: Positive for constipation.    All other systems reviewed and negative.   EKGs/Labs/Other Studies Reviewed:    The following studies were reviewed today: none  EKG:  EKG is  ordered today.  The ekg ordered today demonstrates NSR at 90bpm with nonspecific ST abnormality  Recent Labs: No results found for requested labs within last 8760 hours.   Recent Lipid Panel    Component Value Date/Time   CHOL 181 06/30/2014 0914   TRIG 84.0 06/30/2014 0914   HDL 48.60 06/30/2014 0914   CHOLHDL 4 06/30/2014 0914   VLDL 16.8 06/30/2014 0914   LDLCALC 116 (H) 06/30/2014 0914    Physical Exam:    VS:  BP 134/86   Pulse 90   Ht 5' 4.5" (1.638 m)   Wt 260 lb (117.9 kg)   BMI 43.94 kg/m     Wt Readings from Last 3 Encounters:  05/22/17 260 lb (117.9 kg)  05/17/16 262 lb (118.8 kg)  06/30/14 262 lb 3.2 oz (118.9 kg)     GEN:  Well nourished, well developed in no acute distress HEENT: Normal NECK: No JVD; No carotid bruits LYMPHATICS: No lymphadenopathy CARDIAC: RRR, no murmurs, rubs, gallops RESPIRATORY:  Clear to auscultation without rales, wheezing or rhonchi  ABDOMEN: Soft, non-tender, non-distended MUSCULOSKELETAL:  No edema; No deformity  SKIN: Warm and dry NEUROLOGIC:  Alert and oriented x 3 PSYCHIATRIC:  Normal affect   ASSESSMENT:    1. Atherosclerosis of native coronary artery of native heart without angina pectoris   2. Essential hypertension, benign   3. Pure hypercholesterolemia    PLAN:    In order of problems listed above:  1.  ASCAD -  s/p PCI of the RCA in 2005.  She denies any chest pain but has had some random achiness in her left arm at times when she exercise so I will get a stress myoview to rule out ischemia. Marland Kitchen.  She will continue on aspirin 81 mg daily and 10.  2.  HTN - blood pressures well controlled on exam today.  She will continue on Lotrel 5-10 mg daily.  3.  Hyperlipidemia - LDL goal is less than 70.  Her last LDL was 101  on 04/04/2017.  I will increase simvastatin to 20mg  daily and I will repeat an FLP and ALT.  She has started an exercise program through a fitness center.  Medication Adjustments/Labs and Tests Ordered: Current medicines are reviewed at length with  the patient today.  Concerns regarding medicines are outlined above.  No orders of the defined types were placed in this encounter.  No orders of the defined types were placed in this encounter.   Signed, Armanda Magic, MD  05/22/2017 8:26 AM    [facility]

## 2017-05-22 NOTE — Patient Instructions (Signed)
Medication Instructions:  Your physician has recommended you make the following change in your medication:  INCREASE: simvastatin 20 mg once a day   If you need a refill on your cardiac medications, please contact your pharmacy first.  Labwork: Your physician recommends that you return for lab work in: 6 weeks for liver function and fasting lipids    Testing/Procedures: Your physician has requested that you have en exercise stress myoview. For further information please visit [email or web address]. Please follow instruction sheet, as given.  Follow-Up: Your physician wants you to follow-up in: 1 year with Dr. Mayford Knifeurner. You will receive a reminder letter in the mail two months in advance. If you don't receive a letter, please call our office to schedule the follow-up appointment.  Any Other Special Instructions Will Be Listed Below (If Applicable).   Thank you [facility]    Angela PeroneRena Jiaire Rosebrook, RN  [phone]  If you need a refill on your cardiac medications before your next appointment, please call your pharmacy.

## 2017-05-24 ENCOUNTER — Telehealth (HOSPITAL_COMMUNITY): Payer: Self-pay | Admitting: *Deleted

## 2017-05-24 NOTE — Telephone Encounter (Signed)
Patient given detailed instructions per Myocardial Perfusion Study Information Sheet for the test on 05/29/17 at 12:30. Patient notified to arrive 15 minutes early and that it is imperative to arrive on time for appointment to keep from having the test rescheduled. ° If you need to cancel or reschedule your appointment, please call the office within 24 hours of your appointment. . Patient verbalized understanding.Sharon S Brooks ° ° ° °

## 2017-05-29 ENCOUNTER — Ambulatory Visit (HOSPITAL_COMMUNITY): Payer: Medicare Other | Attending: Internal Medicine

## 2017-05-29 DIAGNOSIS — R9439 Abnormal result of other cardiovascular function study: Secondary | ICD-10-CM | POA: Insufficient documentation

## 2017-05-29 DIAGNOSIS — R030 Elevated blood-pressure reading, without diagnosis of hypertension: Secondary | ICD-10-CM | POA: Insufficient documentation

## 2017-05-29 DIAGNOSIS — I2510 Atherosclerotic heart disease of native coronary artery without angina pectoris: Secondary | ICD-10-CM | POA: Insufficient documentation

## 2017-05-29 MED ORDER — TECHNETIUM TC 99M TETROFOSMIN IV KIT
33.0000 | PACK | Freq: Once | INTRAVENOUS | Status: AC | PRN
  Administered 2017-05-29: 33 via INTRAVENOUS
  Filled 2017-05-29: qty 33

## 2017-05-30 ENCOUNTER — Ambulatory Visit (HOSPITAL_COMMUNITY): Payer: Medicare Other | Attending: Cardiology

## 2017-05-30 LAB — MYOCARDIAL PERFUSION IMAGING
CHL CUP NUCLEAR SRS: 0
CHL CUP NUCLEAR SSS: 0
CHL CUP RESTING HR STRESS: 75 {beats}/min
CHL RATE OF PERCEIVED EXERTION: 19
CSEPED: 4 min
Estimated workload: 4.6 METS
LV dias vol: 84 mL (ref 46–106)
LV sys vol: 31 mL
MPHR: 154 {beats}/min
NUC STRESS TID: 0.9
Peak HR: 157 {beats}/min
Percent HR: 102 %
RATE: 0.31
SDS: 0

## 2017-05-30 MED ORDER — TECHNETIUM TC 99M TETROFOSMIN IV KIT
30.9000 | PACK | Freq: Once | INTRAVENOUS | Status: AC | PRN
  Administered 2017-05-30: 30.9 via INTRAVENOUS
  Filled 2017-05-30: qty 31

## 2017-06-05 DIAGNOSIS — I8312 Varicose veins of left lower extremity with inflammation: Secondary | ICD-10-CM | POA: Diagnosis not present

## 2017-06-05 DIAGNOSIS — I8311 Varicose veins of right lower extremity with inflammation: Secondary | ICD-10-CM | POA: Diagnosis not present

## 2017-06-12 DIAGNOSIS — J309 Allergic rhinitis, unspecified: Secondary | ICD-10-CM | POA: Diagnosis not present

## 2017-06-12 DIAGNOSIS — J209 Acute bronchitis, unspecified: Secondary | ICD-10-CM | POA: Diagnosis not present

## 2017-06-25 ENCOUNTER — Emergency Department (HOSPITAL_COMMUNITY)
Admission: EM | Admit: 2017-06-25 | Discharge: 2017-06-25 | Disposition: A | Discharge status: Home / Self Care | Payer: Medicare Other | Attending: Emergency Medicine | Admitting: Emergency Medicine

## 2017-06-25 ENCOUNTER — Emergency Department (HOSPITAL_COMMUNITY): Payer: Medicare Other

## 2017-06-25 ENCOUNTER — Other Ambulatory Visit: Payer: Self-pay

## 2017-06-25 ENCOUNTER — Encounter (HOSPITAL_COMMUNITY): Payer: Self-pay | Admitting: Emergency Medicine

## 2017-06-25 DIAGNOSIS — E119 Type 2 diabetes mellitus without complications: Secondary | ICD-10-CM | POA: Insufficient documentation

## 2017-06-25 DIAGNOSIS — I2510 Atherosclerotic heart disease of native coronary artery without angina pectoris: Secondary | ICD-10-CM | POA: Insufficient documentation

## 2017-06-25 DIAGNOSIS — E785 Hyperlipidemia, unspecified: Secondary | ICD-10-CM | POA: Diagnosis not present

## 2017-06-25 DIAGNOSIS — I10 Essential (primary) hypertension: Secondary | ICD-10-CM | POA: Insufficient documentation

## 2017-06-25 DIAGNOSIS — K8020 Calculus of gallbladder without cholecystitis without obstruction: Secondary | ICD-10-CM | POA: Insufficient documentation

## 2017-06-25 DIAGNOSIS — Z7982 Long term (current) use of aspirin: Secondary | ICD-10-CM | POA: Insufficient documentation

## 2017-06-25 DIAGNOSIS — R1031 Right lower quadrant pain: Secondary | ICD-10-CM

## 2017-06-25 DIAGNOSIS — Z79899 Other long term (current) drug therapy: Secondary | ICD-10-CM | POA: Diagnosis not present

## 2017-06-25 LAB — URINALYSIS, ROUTINE W REFLEX MICROSCOPIC
Bilirubin Urine: NEGATIVE
Glucose, UA: NEGATIVE mg/dL
Hgb urine dipstick: NEGATIVE
Ketones, ur: NEGATIVE mg/dL
Nitrite: NEGATIVE
Protein, ur: NEGATIVE mg/dL
Specific Gravity, Urine: 1.019 (ref 1.005–1.030)
pH: 5 (ref 5.0–8.0)

## 2017-06-25 LAB — CBC
HCT: 44 % (ref 36.0–46.0)
Hemoglobin: 14.3 g/dL (ref 12.0–15.0)
MCH: 28.9 pg (ref 26.0–34.0)
MCHC: 32.5 g/dL (ref 30.0–36.0)
MCV: 88.9 fL (ref 78.0–100.0)
Platelets: 255 10*3/uL (ref 150–400)
RBC: 4.95 MIL/uL (ref 3.87–5.11)
RDW: 14.6 % (ref 11.5–15.5)
WBC: 8.7 10*3/uL (ref 4.0–10.5)

## 2017-06-25 LAB — COMPREHENSIVE METABOLIC PANEL
ALT: 10 U/L — ABNORMAL LOW (ref 14–54)
AST: 14 U/L — ABNORMAL LOW (ref 15–41)
Albumin: 3.9 g/dL (ref 3.5–5.0)
Alkaline Phosphatase: 133 U/L — ABNORMAL HIGH (ref 38–126)
Anion gap: 11 (ref 5–15)
BUN: 8 mg/dL (ref 6–20)
CO2: 24 mmol/L (ref 22–32)
Calcium: 9.8 mg/dL (ref 8.9–10.3)
Chloride: 102 mmol/L (ref 101–111)
Creatinine, Ser: 0.75 mg/dL (ref 0.44–1.00)
GFR calc Af Amer: >60 mL/min (ref >60)
GFR calc non Af Amer: >60 mL/min (ref >60)
Glucose, Bld: 155 mg/dL — ABNORMAL HIGH (ref 65–99)
Potassium: 3.7 mmol/L (ref 3.5–5.1)
Sodium: 137 mmol/L (ref 135–145)
Total Bilirubin: 1 mg/dL (ref 0.3–1.2)
Total Protein: 8.5 g/dL — ABNORMAL HIGH (ref 6.5–8.1)

## 2017-06-25 LAB — LIPASE, BLOOD: Lipase: 27 U/L (ref 11–51)

## 2017-06-25 MED ORDER — IOPAMIDOL (ISOVUE-370) INJECTION 76%
INTRAVENOUS | Status: AC
  Filled 2017-06-25: qty 100

## 2017-06-25 MED ORDER — IOHEXOL 300 MG/ML  SOLN
100.0000 mL | Freq: Once | INTRAMUSCULAR | Status: AC | PRN
  Administered 2017-06-25: 100 mL via INTRAVENOUS

## 2017-06-25 MED ORDER — HYDROCODONE-ACETAMINOPHEN 5-325 MG PO TABS: 1.0000 | ORAL_TABLET | ORAL | 0 refills | Status: DC | PRN

## 2017-06-25 MED ORDER — SODIUM CHLORIDE 0.9 % IV BOLUS
1000.0000 mL | Freq: Once | INTRAVENOUS | Status: AC
  Administered 2017-06-25: 1000 mL via INTRAVENOUS

## 2017-06-25 MED ORDER — ONDANSETRON HCL 4 MG/2ML IJ SOLN
4.0000 mg | INTRAMUSCULAR | Status: AC
  Administered 2017-06-25: 4 mg via INTRAVENOUS
  Filled 2017-06-25: qty 2

## 2017-06-25 MED ORDER — ONDANSETRON HCL 4 MG PO TABS: 4.0000 mg | ORAL_TABLET | Freq: Three times a day (TID) | ORAL | 0 refills | Status: DC | PRN

## 2017-06-25 MED ORDER — MORPHINE SULFATE (PF) 4 MG/ML IV SOLN
4.0000 mg | Freq: Once | INTRAVENOUS | Status: AC
  Administered 2017-06-25: 4 mg via INTRAVENOUS
  Filled 2017-06-25: qty 1

## 2017-06-25 NOTE — ED Notes (Signed)
Patient verbalizes understanding of discharge instructions. Opportunity for questioning and answers were provided. Armband removed by staff, pt discharged from ED.  

## 2017-06-25 NOTE — Discharge Instructions (Signed)

## 2017-06-25 NOTE — ED Triage Notes (Signed)
Pt c/o right side ABD pain 8/10 since yesterday morning getting worse tonight, pt denies any n/v/d, no urinary symptoms, LBM yesterday.

## 2017-06-25 NOTE — ED Provider Notes (Signed)
[facility] EMERGENCY DEPARTMENT Provider Note   CSN: [id number] Arrival date & time: 06/25/17  5284     History   Chief Complaint Chief Complaint  Patient presents with  . Abdominal Pain    HPI CHAR Angela Morrison is a 67 y.o. female who presents with cc of RLQ abdominal pain. The patient states that Angela Morrison developed achy abdominal pain ain the RLQ of her abdomen. She has had progressively worsening pain over the past 24 hours, now rated as 9/10. Laughing, coughing and movement worsens the pain. Last BM yesterday, normal and cathartic. No fever, chills, diarrhea, urinary or vaginal sxs. She has mild nausea without vomiting. She is s/p open appendectomy many years ago.  HPI  Past Medical History:  Diagnosis Date  . Anemia    few yrs ago none recent  . Arthritis   . Coronary artery disease 2005   S/P PCI of RCA  . Depression   . Diabetes mellitus without complication (HCC)    type 2  . Hyperlipidemia   . Hypertension   . Obesity   . PONV (postoperative nausea and vomiting)   . Thyroid nodule    S/P bx 8/13 c/w goiter    Patient Active Problem List   Diagnosis Date Noted  . Coronary atherosclerosis of native coronary artery 06/21/2013  . Essential hypertension, benign 06/21/2013  . Pure hypercholesterolemia 06/21/2013  . Encounter for long-term (current) use of other medications 06/21/2013    Past Surgical History:  Procedure Laterality Date  . appendectomy Right   . BACK SURGERY     lower back  . COLONOSCOPY WITH PROPOFOL N/A 05/17/2016   Procedure: COLONOSCOPY WITH PROPOFOL;  Surgeon: Charolett Bumpers, MD;  Location: [facility];  Service: Endoscopy;  Laterality: N/A;     OB History   None      Home Medications    Prior to Admission medications   Medication Sig Start Date End Date Taking? Authorizing Provider  amLODipine-benazepril (LOTREL) 5-10 MG per capsule Take 1 capsule by mouth at bedtime.    Yes [provider]  aspirin  EC 81 MG tablet Take 1 tablet (81 mg total) by mouth daily. Patient taking differently: Take 81 mg by mouth every evening.  07/12/13  Yes Quintella Reichert, MD  Cholecalciferol (VITAMIN D3) 2000 UNITS capsule Take 2,000 Units by mouth daily.    Yes [provider]  fluticasone (FLONASE) 50 MCG/ACT nasal spray Place 1 spray into both nostrils daily as needed for allergies or rhinitis.   Yes [provider]  pioglitazone (ACTOS) 15 MG tablet Take 15 mg by mouth at bedtime.    Yes [provider]  simvastatin (ZOCOR) 20 MG tablet Take 1 tablet (20 mg total) by mouth at bedtime. 05/22/17  Yes Turner, Cornelious Bryant, MD  HYDROcodone-acetaminophen (NORCO) 5-325 MG tablet Take 1 tablet by mouth every 4 (four) hours as needed for severe pain. 06/25/17   Kareena Arrambide, Cammy Copa, PA-C  ondansetron (ZOFRAN) 4 MG tablet Take 1 tablet (4 mg total) by mouth every 8 (eight) hours as needed for nausea or vomiting. 06/25/17   Arthor Captain, PA-C    Family History Family History  Problem Relation Age of Onset  . Alzheimer's disease Mother   . Dementia Father     Social History Social History   Tobacco Use  . Smoking status: Never Smoker  . Smokeless tobacco: Never Used  Substance Use Topics  . Alcohol use: Yes    Comment: occ  .  Drug use: No     Allergies   Crestor [rosuvastatin] and Penicillins   Review of Systems Review of Systems  Ten systems reviewed and are negative for acute change, except as noted in the HPI.   Physical Exam Updated Vital Signs BP (!) 165/94   Pulse 85   Temp 98.1 F (36.7 C) (Oral)   Resp 18   Ht 5' 4.5" (1.638 m)   Wt 117.5 kg (259 lb)   SpO2 99%   BMI 43.77 kg/m   Physical Exam  Constitutional: She is oriented to person, place, and time. She appears well-developed and well-nourished. No distress.  HENT:  Head: Normocephalic and atraumatic.  Eyes: Conjunctivae are normal. No scleral icterus.  Neck: Normal range of motion.  Cardiovascular:  Normal rate, regular rhythm and normal heart sounds. Exam reveals no gallop and no friction rub.  No murmur heard. Pulmonary/Chest: Effort normal and breath sounds normal. No respiratory distress.  Abdominal: Soft. Bowel sounds are normal. She exhibits no distension and no mass. There is tenderness in the right lower quadrant. There is guarding. There is no rebound and no CVA tenderness.  Firmness under large, well healed open appendectomy scar in the RLQ  Neurological: She is alert and oriented to person, place, and time.  Skin: Skin is warm and dry. She is not diaphoretic.  Psychiatric: Her behavior is normal.  Nursing note and vitals reviewed.    ED Treatments / Results  Labs (all labs ordered are listed, but only abnormal results are displayed) Labs Reviewed  COMPREHENSIVE METABOLIC PANEL - Abnormal; Notable for the following components:      Result Value   Glucose, Bld 155 (*)    Total Protein 8.5 (*)    AST 14 (*)    ALT 10 (*)    Alkaline Phosphatase 133 (*)    All other components within normal limits  URINALYSIS, ROUTINE W REFLEX MICROSCOPIC - Abnormal; Notable for the following components:   APPearance HAZY (*)    Leukocytes, UA LARGE (*)    Bacteria, UA RARE (*)    All other components within normal limits  URINE CULTURE  LIPASE, BLOOD  CBC    EKG None  Radiology Ct Abdomen Pelvis W Contrast  Result Date: 06/25/2017 CLINICAL DATA:  RIGHT-sided abdominal pain for 24 hours EXAM: CT ABDOMEN AND PELVIS WITH CONTRAST TECHNIQUE: Multidetector CT imaging of the abdomen and pelvis was performed using the standard protocol following bolus administration of intravenous contrast. CONTRAST:  OMNIPAQUE IOHEXOL 300 MG/ML  SOLN COMPARISON:  None. FINDINGS: Lower chest: Lung bases are clear. Hepatobiliary: Small indeterminate density 9 mm lesion in the RIGHT hepatic lobe. No biliary duct dilatation. Large 3 cm gallstone within the gallbladder. The gallbladder is elongated but  no clear pericholecystic fluid or gallbladder wall thickening. Common bile duct normal caliber. Pancreas: Pancreas is normal. No ductal dilatation. No pancreatic inflammation. Spleen: Normal spleen Adrenals/urinary tract: Adrenal glands and kidneys are normal. The ureters and bladder normal. Stomach/Bowel: Stomach, small-bowel and cecum normal. Appendix not identified. The colon and rectosigmoid colon are normal. Vascular/Lymphatic: Abdominal aorta is normal caliber with atherosclerotic calcification. There is no retroperitoneal or periportal lymphadenopathy. No pelvic lymphadenopathy. Reproductive: Uterus and ovaries normal. Other: No free fluid. Musculoskeletal: No aggressive osseous lesion. IMPRESSION: Large gallstone within an elongated gallbladder. No secondary signs of cholecystitis. No ureterolithiasis or obstructive uropathy. Post appendectomy Electronically Signed   By: Genevive Bi M.D.   On: 06/25/2017 09:31    Procedures Procedures (including  critical care time)  Medications Ordered in ED Medications  sodium chloride 0.9 % bolus 1,000 mL (1,000 mLs Intravenous New Bag/Given 06/25/17 0728)  morphine 4 MG/ML injection 4 mg (4 mg Intravenous Given 06/25/17 0728)  ondansetron (ZOFRAN) injection 4 mg (4 mg Intravenous Given 06/25/17 0728)  iohexol (OMNIPAQUE) 300 MG/ML solution 100 mL (100 mLs Intravenous Contrast Given 06/25/17 0839)     Initial Impression / Assessment and Plan / ED Course  I have reviewed the triage vital signs and the nursing notes.  Pertinent labs & imaging results that were available during my care of the patient were reviewed by me and considered in my medical decision making (see chart for details).  Clinical Course as of Jun 26 1018  Sun Jun 25, 2017  0938 Leukocytes, UA(!): LARGE [AH]  [phone] Bacteria, UA(!): RARE [AH]    Clinical Course User Index [AH] Arthor Captain, PA-C    CT scan shows cholelithiasis without evidence of cholecystitis.  She does have  large leukocytes and rare bacteria on her urine but does not have any urinary symptoms and I have asked for culture to be sent as requested by my attending physician.  Patient was reexamined and continues to have pain in the right lower quadrant and not the right upper quadrant so I have low suspicion for biliary colic although she does have a mildly elevated alkaline phosphatase level.  Currently she is not actively vomiting, afebrile and hemodynamically stable without a white count.  She was seen by both myself and the attending physician and will be discharged with symptomatic treatment.  Patient is advised to follow with [facility] surgery as she may have potential biliary colic or, incisional hernia not visualized on CT scan.  She did not questions answered the best my ability, discussed return precautions with the patient appears appropriate for discharge at this time.  Final Clinical Impressions(s) / ED Diagnoses   Final diagnoses:  RLQ abdominal pain  Calculus of gallbladder without cholecystitis without obstruction    ED Discharge Orders        Ordered    HYDROcodone-acetaminophen (NORCO) 5-325 MG tablet  Every 4 hours PRN     06/25/17 1016    ondansetron (ZOFRAN) 4 MG tablet  Every 8 hours PRN     06/25/17 1016       Arthor Captain, PA-C 06/25/17 1022    Raeford Razor, MD 06/28/17 [phone]

## 2017-06-26 DIAGNOSIS — K8021 Calculus of gallbladder without cholecystitis with obstruction: Secondary | ICD-10-CM | POA: Diagnosis not present

## 2017-06-26 DIAGNOSIS — R110 Nausea: Secondary | ICD-10-CM | POA: Diagnosis not present

## 2017-06-26 DIAGNOSIS — R8290 Unspecified abnormal findings in urine: Secondary | ICD-10-CM | POA: Diagnosis not present

## 2017-06-26 LAB — URINE CULTURE

## 2017-06-27 ENCOUNTER — Ambulatory Visit: Payer: Self-pay | Admitting: General Surgery

## 2017-06-27 ENCOUNTER — Other Ambulatory Visit: Payer: Medicare Other

## 2017-06-27 DIAGNOSIS — K8020 Calculus of gallbladder without cholecystitis without obstruction: Secondary | ICD-10-CM | POA: Diagnosis not present

## 2017-06-28 ENCOUNTER — Other Ambulatory Visit: Payer: Self-pay

## 2017-06-28 DIAGNOSIS — E7800 Pure hypercholesterolemia, unspecified: Secondary | ICD-10-CM

## 2017-06-28 NOTE — Progress Notes (Signed)
Patient had to reschedule lab appt. Orders placed for LFT and FLP

## 2017-06-29 ENCOUNTER — Other Ambulatory Visit: Payer: Medicare Other

## 2017-06-30 ENCOUNTER — Other Ambulatory Visit: Payer: Medicare Other | Admitting: *Deleted

## 2017-06-30 DIAGNOSIS — E7800 Pure hypercholesterolemia, unspecified: Secondary | ICD-10-CM

## 2017-06-30 LAB — HEPATIC FUNCTION PANEL
ALK PHOS: 130 IU/L — AB (ref 39–117)
ALT: 6 IU/L (ref 0–32)
AST: 11 IU/L (ref 0–40)
Albumin: 3.9 g/dL (ref 3.6–4.8)
BILIRUBIN TOTAL: 0.7 mg/dL (ref 0.0–1.2)
BILIRUBIN, DIRECT: 0.17 mg/dL (ref 0.00–0.40)
Total Protein: 7.3 g/dL (ref 6.0–8.5)

## 2017-06-30 LAB — LIPID PANEL
CHOLESTEROL TOTAL: 193 mg/dL (ref 100–199)
Chol/HDL Ratio: 4.1 ratio (ref 0.0–4.4)
HDL: 47 mg/dL (ref >39)
LDL Calculated: 129 mg/dL — ABNORMAL HIGH (ref 0–99)
TRIGLYCERIDES: 85 mg/dL (ref 0–149)
VLDL Cholesterol Cal: 17 mg/dL (ref 5–40)

## 2017-07-02 ENCOUNTER — Encounter (HOSPITAL_COMMUNITY): Admission: EM | Disposition: A | Discharge status: Home / Self Care | Payer: Self-pay | Source: Home / Self Care

## 2017-07-02 ENCOUNTER — Emergency Department (HOSPITAL_COMMUNITY): Payer: Medicare Other

## 2017-07-02 ENCOUNTER — Other Ambulatory Visit: Payer: Self-pay

## 2017-07-02 ENCOUNTER — Observation Stay (HOSPITAL_COMMUNITY)
Admission: EM | Admit: 2017-07-02 | Discharge: 2017-07-03 | Disposition: A | Discharge status: Home / Self Care | Payer: Medicare Other | Attending: General Surgery | Admitting: General Surgery

## 2017-07-02 ENCOUNTER — Emergency Department (HOSPITAL_COMMUNITY): Payer: Medicare Other | Admitting: Certified Registered"

## 2017-07-02 ENCOUNTER — Encounter (HOSPITAL_COMMUNITY): Payer: Self-pay | Admitting: Emergency Medicine

## 2017-07-02 DIAGNOSIS — Z880 Allergy status to penicillin: Secondary | ICD-10-CM | POA: Diagnosis not present

## 2017-07-02 DIAGNOSIS — E119 Type 2 diabetes mellitus without complications: Secondary | ICD-10-CM | POA: Diagnosis not present

## 2017-07-02 DIAGNOSIS — Z7984 Long term (current) use of oral hypoglycemic drugs: Secondary | ICD-10-CM | POA: Diagnosis not present

## 2017-07-02 DIAGNOSIS — F329 Major depressive disorder, single episode, unspecified: Secondary | ICD-10-CM | POA: Insufficient documentation

## 2017-07-02 DIAGNOSIS — K819 Cholecystitis, unspecified: Secondary | ICD-10-CM | POA: Diagnosis present

## 2017-07-02 DIAGNOSIS — Z79899 Other long term (current) drug therapy: Secondary | ICD-10-CM | POA: Diagnosis not present

## 2017-07-02 DIAGNOSIS — I10 Essential (primary) hypertension: Secondary | ICD-10-CM | POA: Diagnosis not present

## 2017-07-02 DIAGNOSIS — Z888 Allergy status to other drugs, medicaments and biological substances status: Secondary | ICD-10-CM | POA: Diagnosis not present

## 2017-07-02 DIAGNOSIS — M1990 Unspecified osteoarthritis, unspecified site: Secondary | ICD-10-CM | POA: Diagnosis not present

## 2017-07-02 DIAGNOSIS — Z7982 Long term (current) use of aspirin: Secondary | ICD-10-CM | POA: Insufficient documentation

## 2017-07-02 DIAGNOSIS — I2510 Atherosclerotic heart disease of native coronary artery without angina pectoris: Secondary | ICD-10-CM | POA: Diagnosis not present

## 2017-07-02 DIAGNOSIS — K8000 Calculus of gallbladder with acute cholecystitis without obstruction: Secondary | ICD-10-CM | POA: Diagnosis present

## 2017-07-02 DIAGNOSIS — K8010 Calculus of gallbladder with chronic cholecystitis without obstruction: Secondary | ICD-10-CM | POA: Diagnosis not present

## 2017-07-02 DIAGNOSIS — K8020 Calculus of gallbladder without cholecystitis without obstruction: Secondary | ICD-10-CM | POA: Diagnosis not present

## 2017-07-02 HISTORY — PX: CHOLECYSTECTOMY: SHX55

## 2017-07-02 LAB — GLUCOSE, CAPILLARY
GLUCOSE-CAPILLARY: 135 mg/dL — AB (ref 65–99)
Glucose-Capillary: 156 mg/dL — ABNORMAL HIGH (ref 65–99)
Glucose-Capillary: 128 mg/dL — ABNORMAL HIGH (ref 65–99)

## 2017-07-02 SURGERY — LAPAROSCOPIC CHOLECYSTECTOMY WITH INTRAOPERATIVE CHOLANGIOGRAM
Anesthesia: General | Site: Abdomen

## 2017-07-02 MED ORDER — ENOXAPARIN SODIUM 40 MG/0.4ML ~~LOC~~ SOLN: 40.0000 mg | SUBCUTANEOUS | Status: DC

## 2017-07-02 MED ORDER — CIPROFLOXACIN IN D5W 400 MG/200ML IV SOLN
400.0000 mg | Freq: Two times a day (BID) | INTRAVENOUS | Status: DC
  Administered 2017-07-03: 400 mg via INTRAVENOUS
  Filled 2017-07-02: qty 200

## 2017-07-02 MED ORDER — ROCURONIUM BROMIDE 10 MG/ML (PF) SYRINGE
PREFILLED_SYRINGE | INTRAVENOUS | Status: AC
  Filled 2017-07-02: qty 5

## 2017-07-02 MED ORDER — SUCCINYLCHOLINE CHLORIDE 200 MG/10ML IV SOSY
PREFILLED_SYRINGE | INTRAVENOUS | Status: DC | PRN
  Administered 2017-07-02: 120 mg via INTRAVENOUS

## 2017-07-02 MED ORDER — BENAZEPRIL HCL 10 MG PO TABS
10.0000 mg | ORAL_TABLET | Freq: Every day | ORAL | Status: DC
  Filled 2017-07-02: qty 1

## 2017-07-02 MED ORDER — BUPIVACAINE-EPINEPHRINE 0.25% -1:200000 IJ SOLN
INTRAMUSCULAR | Status: DC | PRN
  Administered 2017-07-02: 30 mL

## 2017-07-02 MED ORDER — ACETAMINOPHEN 10 MG/ML IV SOLN
INTRAVENOUS | Status: AC
  Filled 2017-07-02: qty 100

## 2017-07-02 MED ORDER — LIDOCAINE 2% (20 MG/ML) 5 ML SYRINGE
INTRAMUSCULAR | Status: AC
  Filled 2017-07-02: qty 5

## 2017-07-02 MED ORDER — ACETAMINOPHEN 10 MG/ML IV SOLN
1000.0000 mg | Freq: Once | INTRAVENOUS | Status: AC
  Administered 2017-07-02: 1000 mg via INTRAVENOUS

## 2017-07-02 MED ORDER — HYDRALAZINE HCL 20 MG/ML IJ SOLN
INTRAMUSCULAR | Status: AC
  Filled 2017-07-02: qty 1

## 2017-07-02 MED ORDER — LACTATED RINGERS IV SOLN
INTRAVENOUS | Status: DC | PRN
  Administered 2017-07-02 (×2): via INTRAVENOUS

## 2017-07-02 MED ORDER — FENTANYL CITRATE (PF) 250 MCG/5ML IJ SOLN
INTRAMUSCULAR | Status: AC
  Filled 2017-07-02: qty 5

## 2017-07-02 MED ORDER — OXYCODONE HCL 5 MG/5ML PO SOLN
5.0000 mg | Freq: Once | ORAL | Status: DC | PRN
  Filled 2017-07-02: qty 5

## 2017-07-02 MED ORDER — AMLODIPINE BESYLATE 5 MG PO TABS
5.0000 mg | ORAL_TABLET | Freq: Every day | ORAL | Status: DC
  Administered 2017-07-02: 5 mg via ORAL
  Filled 2017-07-02: qty 1

## 2017-07-02 MED ORDER — MIDAZOLAM HCL 5 MG/5ML IJ SOLN
INTRAMUSCULAR | Status: DC | PRN
  Administered 2017-07-02: 2 mg via INTRAVENOUS

## 2017-07-02 MED ORDER — SUGAMMADEX SODIUM 500 MG/5ML IV SOLN
INTRAVENOUS | Status: AC
  Filled 2017-07-02: qty 5

## 2017-07-02 MED ORDER — OXYCODONE HCL 5 MG PO TABS: 5.0000 mg | ORAL_TABLET | Freq: Once | ORAL | Status: DC | PRN

## 2017-07-02 MED ORDER — FENTANYL CITRATE (PF) 100 MCG/2ML IJ SOLN
INTRAMUSCULAR | Status: DC | PRN
  Administered 2017-07-02: 100 ug via INTRAVENOUS
  Administered 2017-07-02 (×3): 50 ug via INTRAVENOUS

## 2017-07-02 MED ORDER — CIPROFLOXACIN IN D5W 400 MG/200ML IV SOLN
INTRAVENOUS | Status: AC
  Filled 2017-07-02: qty 200

## 2017-07-02 MED ORDER — ACETAMINOPHEN 500 MG PO TABS: 1000.0000 mg | ORAL_TABLET | ORAL | Status: DC

## 2017-07-02 MED ORDER — CIPROFLOXACIN IN D5W 400 MG/200ML IV SOLN: 400.0000 mg | INTRAVENOUS | Status: DC

## 2017-07-02 MED ORDER — LIDOCAINE 2% (20 MG/ML) 5 ML SYRINGE
INTRAMUSCULAR | Status: DC | PRN
  Administered 2017-07-02: 100 mg via INTRAVENOUS

## 2017-07-02 MED ORDER — HYDRALAZINE HCL 20 MG/ML IJ SOLN
INTRAMUSCULAR | Status: DC | PRN
  Administered 2017-07-02 (×3): 5 mg via INTRAVENOUS

## 2017-07-02 MED ORDER — SIMVASTATIN 20 MG PO TABS
20.0000 mg | ORAL_TABLET | Freq: Every day | ORAL | Status: DC
  Administered 2017-07-02: 20 mg via ORAL
  Filled 2017-07-02: qty 1

## 2017-07-02 MED ORDER — INSULIN ASPART 100 UNIT/ML ~~LOC~~ SOLN
0.0000 [IU] | Freq: Three times a day (TID) | SUBCUTANEOUS | Status: DC
  Administered 2017-07-02: 3 [IU] via SUBCUTANEOUS

## 2017-07-02 MED ORDER — IOPAMIDOL (ISOVUE-300) INJECTION 61%
INTRAVENOUS | Status: DC | PRN
  Administered 2017-07-02: 7 mL

## 2017-07-02 MED ORDER — HEMOSTATIC AGENTS (NO CHARGE) OPTIME
TOPICAL | Status: DC | PRN
  Administered 2017-07-02: 1 via TOPICAL

## 2017-07-02 MED ORDER — DEXAMETHASONE SODIUM PHOSPHATE 10 MG/ML IJ SOLN
INTRAMUSCULAR | Status: DC | PRN
  Administered 2017-07-02: 10 mg via INTRAVENOUS

## 2017-07-02 MED ORDER — BUPIVACAINE-EPINEPHRINE (PF) 0.25% -1:200000 IJ SOLN
INTRAMUSCULAR | Status: AC
  Filled 2017-07-02: qty 30

## 2017-07-02 MED ORDER — 0.9 % SODIUM CHLORIDE (POUR BTL) OPTIME
TOPICAL | Status: DC | PRN
  Administered 2017-07-02: 1000 mL

## 2017-07-02 MED ORDER — MORPHINE SULFATE (PF) 2 MG/ML IV SOLN
2.0000 mg | INTRAVENOUS | Status: DC | PRN
  Administered 2017-07-02: 2 mg via INTRAVENOUS

## 2017-07-02 MED ORDER — PROPOFOL 10 MG/ML IV BOLUS
INTRAVENOUS | Status: DC | PRN
  Administered 2017-07-02: 180 mg via INTRAVENOUS

## 2017-07-02 MED ORDER — GABAPENTIN 300 MG PO CAPS
300.0000 mg | ORAL_CAPSULE | Freq: Two times a day (BID) | ORAL | Status: DC
  Administered 2017-07-02: 300 mg via ORAL
  Filled 2017-07-02: qty 1

## 2017-07-02 MED ORDER — HYDROMORPHONE HCL 1 MG/ML IJ SOLN: 0.2500 mg | INTRAMUSCULAR | Status: DC | PRN

## 2017-07-02 MED ORDER — OXYCODONE-ACETAMINOPHEN 5-325 MG PO TABS
1.0000 | ORAL_TABLET | ORAL | Status: DC | PRN
  Administered 2017-07-03: 1 via ORAL
  Filled 2017-07-02: qty 1

## 2017-07-02 MED ORDER — PROMETHAZINE HCL 25 MG/ML IJ SOLN: 6.2500 mg | INTRAMUSCULAR | Status: DC | PRN

## 2017-07-02 MED ORDER — POTASSIUM CHLORIDE IN NACL 20-0.9 MEQ/L-% IV SOLN
INTRAVENOUS | Status: DC
  Administered 2017-07-02 – 2017-07-03 (×2): via INTRAVENOUS
  Filled 2017-07-02 (×2): qty 1000

## 2017-07-02 MED ORDER — ONDANSETRON 4 MG PO TBDP: 4.0000 mg | ORAL_TABLET | Freq: Four times a day (QID) | ORAL | Status: DC | PRN

## 2017-07-02 MED ORDER — LACTATED RINGERS IR SOLN
Status: DC | PRN
  Administered 2017-07-02: 1000 mL

## 2017-07-02 MED ORDER — ONDANSETRON HCL 4 MG/2ML IJ SOLN
INTRAMUSCULAR | Status: AC
  Filled 2017-07-02: qty 2

## 2017-07-02 MED ORDER — CELECOXIB 200 MG PO CAPS: 200.0000 mg | ORAL_CAPSULE | ORAL | Status: DC

## 2017-07-02 MED ORDER — SCOPOLAMINE 1 MG/3DAYS TD PT72
MEDICATED_PATCH | TRANSDERMAL | Status: DC | PRN
  Administered 2017-07-02: 1 via TRANSDERMAL

## 2017-07-02 MED ORDER — MIDAZOLAM HCL 2 MG/2ML IJ SOLN
INTRAMUSCULAR | Status: AC
  Filled 2017-07-02: qty 2

## 2017-07-02 MED ORDER — PROPOFOL 10 MG/ML IV BOLUS
INTRAVENOUS | Status: AC
  Filled 2017-07-02: qty 20

## 2017-07-02 MED ORDER — AMLODIPINE BESY-BENAZEPRIL HCL 5-10 MG PO CAPS: 1.0000 | ORAL_CAPSULE | Freq: Every day | ORAL | Status: DC

## 2017-07-02 MED ORDER — CHLORHEXIDINE GLUCONATE CLOTH 2 % EX PADS: 6.0000 | MEDICATED_PAD | Freq: Once | CUTANEOUS | Status: DC

## 2017-07-02 MED ORDER — CELECOXIB 200 MG PO CAPS
200.0000 mg | ORAL_CAPSULE | Freq: Two times a day (BID) | ORAL | Status: DC
  Administered 2017-07-02: 200 mg via ORAL
  Filled 2017-07-02: qty 1

## 2017-07-02 MED ORDER — MEPERIDINE HCL 50 MG/ML IJ SOLN: 6.2500 mg | INTRAMUSCULAR | Status: DC | PRN

## 2017-07-02 MED ORDER — SUCCINYLCHOLINE CHLORIDE 200 MG/10ML IV SOSY
PREFILLED_SYRINGE | INTRAVENOUS | Status: AC
  Filled 2017-07-02: qty 10

## 2017-07-02 MED ORDER — SUGAMMADEX SODIUM 500 MG/5ML IV SOLN
INTRAVENOUS | Status: DC | PRN
  Administered 2017-07-02: 250 mg via INTRAVENOUS

## 2017-07-02 MED ORDER — ONDANSETRON HCL 4 MG/2ML IJ SOLN: 4.0000 mg | Freq: Four times a day (QID) | INTRAMUSCULAR | Status: DC | PRN

## 2017-07-02 MED ORDER — DEXAMETHASONE SODIUM PHOSPHATE 10 MG/ML IJ SOLN
INTRAMUSCULAR | Status: AC
  Filled 2017-07-02: qty 1

## 2017-07-02 MED ORDER — GABAPENTIN 300 MG PO CAPS: 300.0000 mg | ORAL_CAPSULE | ORAL | Status: DC

## 2017-07-02 MED ORDER — MORPHINE SULFATE (PF) 2 MG/ML IV SOLN
INTRAVENOUS | Status: AC
  Filled 2017-07-02: qty 1

## 2017-07-02 MED ORDER — LABETALOL HCL 5 MG/ML IV SOLN
INTRAVENOUS | Status: DC | PRN
  Administered 2017-07-02: 2.5 mg via INTRAVENOUS
  Administered 2017-07-02: 5 mg via INTRAVENOUS

## 2017-07-02 MED ORDER — ROCURONIUM BROMIDE 10 MG/ML (PF) SYRINGE
PREFILLED_SYRINGE | INTRAVENOUS | Status: DC | PRN
  Administered 2017-07-02: 10 mg via INTRAVENOUS
  Administered 2017-07-02: 35 mg via INTRAVENOUS
  Administered 2017-07-02: 15 mg via INTRAVENOUS

## 2017-07-02 MED ORDER — ONDANSETRON HCL 4 MG/2ML IJ SOLN
INTRAMUSCULAR | Status: DC | PRN
  Administered 2017-07-02: 4 mg via INTRAVENOUS

## 2017-07-02 MED ORDER — CIPROFLOXACIN IN D5W 400 MG/200ML IV SOLN
400.0000 mg | INTRAVENOUS | Status: AC
  Administered 2017-07-02: 400 mg via INTRAVENOUS

## 2017-07-02 MED ORDER — LABETALOL HCL 5 MG/ML IV SOLN
INTRAVENOUS | Status: AC
  Filled 2017-07-02: qty 4

## 2017-07-02 MED ORDER — IOPAMIDOL (ISOVUE-300) INJECTION 61%
INTRAVENOUS | Status: AC
  Filled 2017-07-02: qty 50

## 2017-07-02 MED ORDER — SCOPOLAMINE 1 MG/3DAYS TD PT72
MEDICATED_PATCH | TRANSDERMAL | Status: AC
  Filled 2017-07-02: qty 1

## 2017-07-02 SURGICAL SUPPLY — 38 items
ADH SKN CLS APL DERMABOND .7 (GAUZE/BANDAGES/DRESSINGS) ×1
APL SRG 38 LTWT LNG FL B (MISCELLANEOUS) ×1
APPLICATOR ARISTA FLEXITIP XL (MISCELLANEOUS) ×2 IMPLANT
APPLIER CLIP ROT 10 11.4 M/L (STAPLE) ×3
APR CLP MED LRG 11.4X10 (STAPLE) ×1
BAG SPEC RTRVL 10 TROC 200 (ENDOMECHANICALS) ×1
CABLE HIGH FREQUENCY MONO STRZ (ELECTRODE) ×3 IMPLANT
CATH REDDICK CHOLANGI 4FR 50CM (CATHETERS) IMPLANT
CHLORAPREP W/TINT 26ML (MISCELLANEOUS) ×3 IMPLANT
CLIP APPLIE ROT 10 11.4 M/L (STAPLE) ×1 IMPLANT
COVER MAYO STAND STRL (DRAPES) ×3 IMPLANT
COVER SURGICAL LIGHT HANDLE (MISCELLANEOUS) ×3 IMPLANT
DECANTER SPIKE VIAL GLASS SM (MISCELLANEOUS) ×3 IMPLANT
DERMABOND ADVANCED (GAUZE/BANDAGES/DRESSINGS) ×2
DERMABOND ADVANCED .7 DNX12 (GAUZE/BANDAGES/DRESSINGS) ×1 IMPLANT
DRAPE C-ARM 42X120 X-RAY (DRAPES) ×3 IMPLANT
ELECT REM PT RETURN 15FT ADLT (MISCELLANEOUS) ×3 IMPLANT
GLOVE BIOGEL PI IND STRL 7.5 (GLOVE) ×1 IMPLANT
GLOVE BIOGEL PI INDICATOR 7.5 (GLOVE) ×2
GLOVE ECLIPSE 7.5 STRL STRAW (GLOVE) ×3 IMPLANT
GOWN STRL REUS W/TWL XL LVL3 (GOWN DISPOSABLE) ×9 IMPLANT
HEMOSTAT ARISTA ABSORB 3G PWDR (MISCELLANEOUS) ×2 IMPLANT
HEMOSTAT SNOW SURGICEL 2X4 (HEMOSTASIS) IMPLANT
HEMOSTAT SURGICEL 4X8 (HEMOSTASIS) IMPLANT
KIT BASIN OR (CUSTOM PROCEDURE TRAY) ×3 IMPLANT
POUCH RETRIEVAL ECOSAC 10 (ENDOMECHANICALS) IMPLANT
POUCH RETRIEVAL ECOSAC 10MM (ENDOMECHANICALS) ×2
SCISSORS LAP 5X35 DISP (ENDOMECHANICALS) ×3 IMPLANT
SET CHOLANGIOGRAPH MIX (MISCELLANEOUS) ×3 IMPLANT
SET IRRIG TUBING LAPAROSCOPIC (IRRIGATION / IRRIGATOR) ×3 IMPLANT
SLEEVE XCEL OPT CAN 5 100 (ENDOMECHANICALS) ×3 IMPLANT
SUT MNCRL AB 4-0 PS2 18 (SUTURE) ×3 IMPLANT
TOWEL OR 17X26 10 PK STRL BLUE (TOWEL DISPOSABLE) ×3 IMPLANT
TRAY LAPAROSCOPIC (CUSTOM PROCEDURE TRAY) ×3 IMPLANT
TROCAR BLADELESS OPT 5 100 (ENDOMECHANICALS) ×3 IMPLANT
TROCAR XCEL BLUNT TIP 100MML (ENDOMECHANICALS) ×3 IMPLANT
TROCAR XCEL NON-BLD 11X100MML (ENDOMECHANICALS) ×3 IMPLANT
TUBING INSUF HEATED (TUBING) ×3 IMPLANT

## 2017-07-02 NOTE — H&P (Signed)
History of Present Illness This is a patient with known gallstones evaluated by Dr. Carolynne Edouard in our office several days ago with this presentation:  The patient is a 67 year old female who presents with abdominal pain. We are asked to see the patient in consultation by Dr. Arthor Captain to evaluate her for gallstones. The patient is a 67 year old black female who has been having right upper quadrant pain off and on for the last year. She had a severe episode of pain that started last Friday. It did not improve over the weekend and on Sunday she went to the emergency department. A CT scan was performed that showed a large stone in the gallbladder but no gallbladder wall thickening or ductal dilatation. She does experience nausea but no vomiting. Her liver functions were normal.  She called today having persistent severe epigastric and right upper quadrant pain since early this morning.  She came into the emergency room for evaluation.  She has had nausea and vomiting today as well.  No fever chills or jaundice.   Past Surgical History Sander Nephew, CMA; 06/27/2017 9:27 AM) Appendectomy   Diagnostic Studies History Sander Nephew, CMA; 06/27/2017 9:27 AM) Colonoscopy  1-5 years ago Mammogram  1-3 years ago Pap Smear  1-5 years ago  Allergies Sander Nephew, CMA; 06/27/2017 9:28 AM) No Known Allergies [06/27/2017]: Allergies Reconciled   Medication History Sander Nephew, CMA; 06/27/2017 9:30 AM) Simvastatin (  Tablet, Oral) Active. Ventolin HFA (108 (90 Base)MCG/ACT Aerosol Soln, Inhalation) Active. Ondansetron HCl (  Tablet, Oral) Active. Actos (Oral) Specific strength unknown - Active. Lotrel (Oral) Specific strength unknown - Active. Vitamin D (Oral) Specific strength unknown - Active. Calcium (Oral) Specific strength unknown - Active. Aspirin (  Tablet DR, Oral) Active. Medications Reconciled  Social History Duwayne Heck [employer] fast streamer, CMA;  06/27/2017 9:28 AM) Alcohol use  Occasional alcohol use. Caffeine use  Carbonated beverages. No drug use  Tobacco use  Never smoker.  Family History Sander Nephew, CMA; 06/27/2017 9:28 AM) Alcohol Abuse  Brother, Father. Arthritis  Mother. Cancer  Mother. Heart Disease  Brother, Father. Heart disease in female family member before age 74  Hypertension  Brother, Father, Mother.  Pregnancy / Birth History Sander Nephew, CMA; 06/27/2017 9:28 AM) Age at menarche  13 years. Age of menopause  79-60 Contraceptive History  Oral contraceptives. Gravida  1 Irregular periods  Maternal age  85-30 Para  1  Other Problems Sander Nephew, CMA; 06/27/2017 9:28 AM) Back Pain  Diabetes Mellitus  High blood pressure  Hypercholesterolemia  Myocardial infarction- previous stent placement, recent cardiology evaluation and stress test within the past month felt low risk  vascular Disease     Review of Systems Duwayne Heck Gerrigner CMA; 06/27/2017 9:28 AM) General Not Present- Appetite Loss, Chills, Fatigue, Fever, Night Sweats, Weight Gain and Weight Loss. Skin Not Present- Change in Wart/Mole, Dryness, Hives, Jaundice, New Lesions, Non-Healing Wounds, Rash and Ulcer. HEENT Not Present- Earache, Hearing Loss, Hoarseness, Nose Bleed, Oral Ulcers, Ringing in the Ears, Seasonal Allergies, Sinus Pain, Sore Throat, Visual Disturbances, Wears glasses/contact lenses and Yellow Eyes. Respiratory Not Present- Bloody sputum, Chronic Cough, Difficulty Breathing, Snoring and Wheezing. Breast Not Present- Breast Mass, Breast Pain, Nipple Discharge and Skin Changes. Cardiovascular Not Present- Chest Pain, Difficulty Breathing Lying Down, Leg Cramps, Palpitations, Rapid Heart Rate, Shortness of Breath and Swelling of Extremities. Gastrointestinal Present- Constipation. Not Present- Abdominal Pain, Bloating, Bloody Stool, Change in Bowel Habits, Chronic diarrhea, Difficulty Swallowing,  Excessive gas, Gets full quickly at meals, Hemorrhoids, Indigestion, Nausea,  Rectal Pain and Vomiting. Female Genitourinary Not Present- Frequency, Nocturia, Painful Urination, Pelvic Pain and Urgency. Musculoskeletal Not Present- Back Pain, Joint Pain, Joint Stiffness, Muscle Pain, Muscle Weakness and Swelling of Extremities. Neurological Not Present- Decreased Memory, Fainting, Headaches, Numbness, Seizures, Tingling, Tremor, Trouble walking and Weakness. Psychiatric Not Present- Anxiety, Bipolar, Change in Sleep Pattern, Depression, Fearful and Frequent crying. Endocrine Not Present- Cold Intolerance, Excessive Hunger, Hair Changes, Heat Intolerance, Hot flashes and New Diabetes. Hematology Not Present- Blood Thinners, Easy Bruising, Excessive bleeding, Gland problems, HIV and Persistent Infections.  BP (!) 152/102 (BP Location: Left Arm)   Pulse 86   Temp 98.2 F (36.8 C) (Oral)   Resp 16   Ht 5' 4.5" (1.638 m)   Wt 117.5 kg (259 lb)   SpO2 98%   BMI 43.77 kg/m   Physical Exam  General Mental Status-Alert. General Appearance-Consistent with stated age. Hydration-Well hydrated. Voice-Normal.  Head and Neck Head-normocephalic, atraumatic with no lesions or palpable masses. Trachea-midline. Thyroid Gland Characteristics - normal size and consistency.  Eye Eyeball - Bilateral-Extraocular movements intact. Sclera/Conjunctiva - Bilateral-No scleral icterus.  Chest and Lung Exam Chest and lung exam reveals -quiet, even and easy respiratory effort with no use of accessory muscles and on auscultation, normal breath sounds, no adventitious sounds and normal vocal resonance. Inspection Chest Wall - Normal. Back - normal.  Cardiovascular Cardiovascular examination reveals -normal heart sounds, regular rate and rhythm with no murmurs and normal pedal pulses bilaterally.  Abdomen Note: The abdomen is soft.  Fairly marked epigastric and right upper quadrant  tenderness with some guarding.  There is a well-healed low right transverse scar from an old appendectomy.   Neurologic Neurologic evaluation reveals -alert and oriented x 3 with no impairment of recent or remote memory. Mental Status-Normal.  Musculoskeletal Normal Exam - Left-Upper Extremity Strength Normal and Lower Extremity Strength Normal. Normal Exam - Right-Upper Extremity Strength Normal and Lower Extremity Strength Normal.  Lymphatic Head & Neck  General Head & Neck Lymphatics: Bilateral - Description - Normal. Axillary  General Axillary Region: Bilateral - Description - Normal. Tenderness - Non Tender. Femoral & Inguinal  Generalized Femoral & Inguinal Lymphatics: Bilateral - Description - Normal. Tenderness - Non Tender.    Assessment & Plan Renae Fickle S. Carolynne Edouard MD; 06/27/2017 9:56 AM) GALLSTONES (K80.20) Impression: Cholelithiasis with persistent biliary colic or early acute cholecystitis.  I recommended admission for urgent laparoscopic cholecystectomy with cholangiogram.I discussed the procedure in detail.  The patient was given Agricultural engineer.  We discussed the risks and benefits of a laparoscopic cholecystectomy and possible cholangiogram including, but not limited to bleeding, infection, injury to surrounding structures such as the intestine or liver, bile leak, retained gallstones, need to convert to an open procedure, prolonged diarrhea, blood clots such as  DVT, common bile duct injury, anesthesia risks, and possible need for additional procedures.  The likelihood of improvement in symptoms and return to the patient's normal status is good. We discussed the typical post-operative recovery course.  She understands and agrees to proceed.  Mariella Saa MD, FACS  07/02/2017, 11:22 AM

## 2017-07-02 NOTE — Op Note (Signed)
Preoperative Diagnosis: Cholelithiasis and cholecystitis  Postoprative Diagnosis: Same with acute and subacute gangrenous cholecystitis  Procedure: Procedure(s): LAPAROSCOPIC CHOLECYSTECTOMY WITH INTRAOPERATIVE CHOLANGIOGRAM   Surgeon: Glenna Fellows T   Assistants: None  Anesthesia:  General endotracheal anesthesia  Indications: Patient recently presented to the office with recurring episodes of epigastric and right upper quadrant abdominal pain.  Imaging showed large gallstones.  Normal LFTs.  She presented to the emergency department today with worsening constant epigastric and right upper quadrant pain with significant tenderness.  We recommend proceeding with urgent laparoscopic cholecystectomy with cholangiogram.  The procedure and risks were discussed in detail documented elsewhere and she agrees to proceed.    Procedure Detail: Patient was brought to the operating room, placed in the supine position on the operating table, and general endotracheal anesthesia induced.  She had received preoperative IV antibiotics.  The abdomen was widely sterilely prepped and draped.  Patient timeout was performed and correct procedure verified.  Trocar sites were infiltrated with local anesthesia.  Access was obtained with a 2 cm incision at the umbilicus with an open Hassan technique with a mattress suture of 0 Vicryl.  Under direct vision a 11 mm trocar was placed subxiphoid.  There were omental adhesions in the right upper and mid abdomen from previous cholecystectomy that were taken down easily with sharp dissection.  The gallbladder fundus was exposed and was tensely distended with gangrenous changes.  Adherent omentum was bluntly stripped off exposing the gallbladder which was very enlarged and tense.  It was aspirated of clear bile and decompressed.  The fundus was elevated up over the liver.  There was a large stone impacted in the infundibulum.  The infundibulum was retracted inferolaterally.   Fibrofatty tissue was carefully stripped off the gallbladder toward the porta hepatis.  Peritoneum anterior and posterior to the distal gallbladder was incised.  The distal gallbladder was carefully dissected.  A small artery coursing up onto the gallbladder wall apparent cystic artery was clipped proximally and divided with cautery.  The gallbladder was dissected away from the cholecystic plate and tissue carefully dissected away until we identified the cystic duct gallbladder junction and the cystic duct was dissected out over about a centimeter and a half.  When the anatomy appeared clear the cystic duct was clipped of the gallbladder junction and an operative cholangiogram obtained through the cystic duct.  This showed good filling of a normal common bile duct borderline enlarged but no filling defects and free flow into the duodenum.  Intrahepatic ducts well visualized.  The full colangiocatheter was removed and the cystic duct triply clipped proximally and divided.  The gallbladder was then dissected away from the liver bed was somewhat tedious dissection due to severe subacute inflammation.  The gallbladder was detached and placed in a eco-sac and brought out to the umbilicus.  The large stone was broken up and removed and the gallbladder extracted.  The right upper quadrant was thoroughly irrigated and hemostasis obtained.  Raw liver surface was coated with Avista.  There was no evidence of bleeding or injury or other problems.  All CO2 was evacuated and the mattress suture secured to the umbilicus.  Skin incisions were closed with subarticular Monocryl and Dermabond.  Sponge needle and instrument counts were correct.    Findings: Severe acute and subacute cholecystitis with early gangrenous changes  Estimated Blood Loss:  less than 50 mL         Drains: None  Blood Given: none  Specimens: Gallbladder and contents        Complications:  * No complications entered in OR log *          Disposition: PACU - hemodynamically stable.         Condition: stable

## 2017-07-02 NOTE — Anesthesia Procedure Notes (Signed)
Procedure Name: Intubation Date/Time: 07/02/2017 12:09 PM Performed by: Fabienne Nolasco D, CRNA Pre-anesthesia Checklist: Patient identified, Emergency Drugs available, Suction available and Patient being monitored Patient Re-evaluated:Patient Re-evaluated prior to induction Oxygen Delivery Method: Circle system utilized Preoxygenation: Pre-oxygenation with 100% oxygen Induction Type: IV induction and Rapid sequence Laryngoscope Size: Mac and 4 Grade View: Grade I Tube type: Oral Tube size: 7.5 mm Number of attempts: 1 Airway Equipment and Method: Stylet Placement Confirmation: ETT inserted through vocal cords under direct vision,  positive ETCO2 and breath sounds checked- equal and bilateral Secured at: 22 cm Tube secured with: Tape Dental Injury: Teeth and Oropharynx as per pre-operative assessment

## 2017-07-02 NOTE — ED Triage Notes (Signed)
Pt c/o right abd pains. Reports was seen last Sunday and diagnosed with gall stone and is scheduled to have surgery on gallbladder but reports that pain is worse.

## 2017-07-02 NOTE — Anesthesia Postprocedure Evaluation (Signed)
Anesthesia Post Note  Patient: SHEQUILLA GOODGAME  Procedure(s) Performed: LAPAROSCOPIC CHOLECYSTECTOMY WITH INTRAOPERATIVE CHOLANGIOGRAM (N/A Abdomen)     Patient location during evaluation: PACU Anesthesia Type: General Level of consciousness: awake and alert Pain management: pain level controlled Vital Signs Assessment: post-procedure vital signs reviewed and stable Respiratory status: spontaneous breathing, nonlabored ventilation and respiratory function stable Cardiovascular status: blood pressure returned to baseline and stable Postop Assessment: no apparent nausea or vomiting Anesthetic complications: no    Last Vitals:  Vitals:   07/02/17 1415 07/02/17 1430  BP: (!) 176/86 (!) 166/86  Pulse: 92 91  Resp: (!) 53 13  Temp:  36.4 C  SpO2: 97% 97%    Last Pain:  Vitals:   07/02/17 1430  TempSrc:   PainSc: 0-No pain                 Lowella Curb

## 2017-07-02 NOTE — ED Notes (Signed)
Bed: WTR7 Expected date:  Expected time:  Means of arrival:  Comments: 

## 2017-07-02 NOTE — Transfer of Care (Signed)
Immediate Anesthesia Transfer of Care Note  Patient: Angela Morrison  Procedure(s) Performed: LAPAROSCOPIC CHOLECYSTECTOMY WITH INTRAOPERATIVE CHOLANGIOGRAM (N/A Abdomen)  Patient Location: PACU  Anesthesia Type:General  Level of Consciousness: awake, alert  and oriented  Airway & Oxygen Therapy: Patient Spontanous Breathing and Patient connected to face mask oxygen  Post-op Assessment: Report given to RN and Post -op Vital signs reviewed and stable  Post vital signs: Reviewed and stable  Last Vitals:  Vitals Value Taken Time  BP    Temp    Pulse    Resp    SpO2      Last Pain:  Vitals:   07/02/17 1041  TempSrc:   PainSc: 7          Complications: No apparent anesthesia complications

## 2017-07-02 NOTE — Anesthesia Preprocedure Evaluation (Addendum)
Anesthesia Evaluation  Patient identified by MRN, date of birth, ID band Patient awake    Reviewed: Allergy & Precautions, H&P , NPO status , Patient's Chart, lab work & pertinent test results  History of Anesthesia Complications (+) PONV and history of anesthetic complications  Airway Mallampati: II  TM Distance: >3 FB Neck ROM: full    Dental  (+) Edentulous Upper, Edentulous Lower   Pulmonary neg pulmonary ROS,    breath sounds clear to auscultation       Cardiovascular hypertension, Pt. on medications + CAD  negative cardio ROS   Rhythm:regular Rate:Normal  s/p PCI to RCA   Neuro/Psych PSYCHIATRIC DISORDERS Depression negative neurological ROS  negative psych ROS   GI/Hepatic negative GI ROS, Neg liver ROS,   Endo/Other  negative endocrine ROSdiabetes, Type 2  Renal/GU negative Renal ROS  negative genitourinary   Musculoskeletal negative musculoskeletal ROS (+) Arthritis ,   Abdominal   Peds negative pediatric ROS (+)  Hematology negative hematology ROS (+)   Anesthesia Other Findings   Reproductive/Obstetrics negative OB ROS                            Anesthesia Physical  Anesthesia Plan  ASA: III and emergent  Anesthesia Plan: General   Post-op Pain Management:    Induction: Intravenous, Rapid sequence and Cricoid pressure planned  PONV Risk Score and Plan: 4 or greater and Ondansetron, Dexamethasone, Midazolam, Scopolamine patch - Pre-op and Treatment may vary due to age or medical condition  Airway Management Planned: Oral ETT  Additional Equipment:   Intra-op Plan:   Post-operative Plan: Extubation in OR  Informed Consent: I have reviewed the patients History and Physical, chart, labs and discussed the procedure including the risks, benefits and alternatives for the proposed anesthesia with the patient or authorized representative who has indicated his/her  understanding and acceptance.   Dental advisory given  Plan Discussed with: CRNA and Anesthesiologist  Anesthesia Plan Comments:       Anesthesia Quick Evaluation

## 2017-07-03 ENCOUNTER — Encounter (HOSPITAL_COMMUNITY): Payer: Self-pay | Admitting: General Surgery

## 2017-07-03 DIAGNOSIS — K8010 Calculus of gallbladder with chronic cholecystitis without obstruction: Secondary | ICD-10-CM | POA: Diagnosis not present

## 2017-07-03 LAB — GLUCOSE, CAPILLARY: Glucose-Capillary: 111 mg/dL — ABNORMAL HIGH (ref 65–99)

## 2017-07-03 MED ORDER — HYDROCODONE-ACETAMINOPHEN 5-325 MG PO TABS: 1.0000 | ORAL_TABLET | Freq: Four times a day (QID) | ORAL | 0 refills | Status: DC | PRN

## 2017-07-03 NOTE — Discharge Summary (Signed)
Physician Discharge Summary  Patient ID:  Angela Morrison  MRN: [id number]  DOB/AGE: Oct 01, 1950 67 y.o.  Admit date: 07/02/2017 Discharge date: 07/03/2017  Discharge Diagnoses:  1.  Cholecystitis with cholelithiasis 2.  HTN 3.  CAD  Followed by Dr. Kym Groom 4.  DM   Active Problems:   Cholelithiasis and acute cholecystitis without obstruction  Operation: Procedure(s): LAPAROSCOPIC CHOLECYSTECTOMY WITH INTRAOPERATIVE CHOLANGIOGRAM on 07/02/2017 - [facility]. [facility]  Discharged Condition: good  Hospital Course: Angela Morrison is an 67 y.o. female whose primary care physician is Gweneth Dimitri, MD and who was admitted 07/02/2017 with a chief complaint of  Chief Complaint  Patient presents with  . Abdominal Pain  . Nausea  .   She was brought to the operating room on 07/02/2017 and underwent  LAPAROSCOPIC CHOLECYSTECTOMY WITH INTRAOPERATIVE CHOLANGIOGRAM.   The discharge instructions were reviewed with the patient.  Consults: None  Significant Diagnostic Studies: Results for orders placed or performed during the hospital encounter of 07/02/17  Glucose, capillary  Result Value Ref Range   Glucose-Capillary 135 (H) 65 - 99 mg/dL  Glucose, capillary  Result Value Ref Range   Glucose-Capillary 156 (H) 65 - 99 mg/dL   Comment 1 Notify RN    Comment 2 Document in Chart   Glucose, capillary  Result Value Ref Range   Glucose-Capillary 128 (H) 65 - 99 mg/dL  Glucose, capillary  Result Value Ref Range   Glucose-Capillary 111 (H) 65 - 99 mg/dL    Dg Cholangiogram Operative  Result Date: 07/02/2017 CLINICAL DATA:  Intraoperative cholangiogram.  Cholelithiasis. EXAM: INTRAOPERATIVE CHOLANGIOGRAM TECHNIQUE: Cholangiographic images from the C-arm fluoroscopic device were submitted for interpretation post-operatively. Please see the procedural report for the amount of contrast and the fluoroscopy time utilized. COMPARISON:  CT 06/25/2017 FINDINGS: No persistent filling defects in the common  duct. Intrahepatic ducts are incompletely visualized, appearing decompressed centrally. Contrast passes into the duodenum. : Negative for retained common duct stone. Electronically Signed   By: Corlis Leak M.D.   On: 07/02/2017 13:25   Ct Abdomen Pelvis W Contrast  Result Date: 06/25/2017 CLINICAL DATA:  RIGHT-sided abdominal pain for 24 hours EXAM: CT ABDOMEN AND PELVIS WITH CONTRAST TECHNIQUE: Multidetector CT imaging of the abdomen and pelvis was performed using the standard protocol following bolus administration of intravenous contrast. CONTRAST:  OMNIPAQUE IOHEXOL 300 MG/ML  SOLN COMPARISON:  None. FINDINGS: Lower chest: Lung bases are clear. Hepatobiliary: Small indeterminate density 9 mm lesion in the RIGHT hepatic lobe. No biliary duct dilatation. Large 3 cm gallstone within the gallbladder. The gallbladder is elongated but no clear pericholecystic fluid or gallbladder wall thickening. Common bile duct normal caliber. Pancreas: Pancreas is normal. No ductal dilatation. No pancreatic inflammation. Spleen: Normal spleen Adrenals/urinary tract: Adrenal glands and kidneys are normal. The ureters and bladder normal. Stomach/Bowel: Stomach, small-bowel and cecum normal. Appendix not identified. The colon and rectosigmoid colon are normal. Vascular/Lymphatic: Abdominal aorta is normal caliber with atherosclerotic calcification. There is no retroperitoneal or periportal lymphadenopathy. No pelvic lymphadenopathy. Reproductive: Uterus and ovaries normal. Other: No free fluid. Musculoskeletal: No aggressive osseous lesion. IMPRESSION: Large gallstone within an elongated gallbladder. No secondary signs of cholecystitis. No ureterolithiasis or obstructive uropathy. Post appendectomy Electronically Signed   By: Genevive Bi M.D.   On: 06/25/2017 09:31    Discharge Exam:  Vitals:   07/03/17 0139 07/03/17 0550  BP: 134/76 138/71  Pulse: 96 83  Resp: 16 16  Temp: 98.3 F (36.8 C) 98.7 F (37.1  C)   SpO2: 94% 97%    General: Obese AA F who is alert and generally healthy appearing.  Lungs: Clear to auscultation and symmetric breath sounds. Heart:  RRR. No murmur or rub. Abdomen: Soft. No mass. No tenderness. No hernia. Normal bowel sounds.  Extremities:  Good strength and ROM  in upper and lower extremities. Neurologic:  Grossly intact to motor and sensory function. Psychiatric: Has normal mood and affect. Behavior is normal.   Discharge Medications:   Allergies as of 07/03/2017      Reactions   Crestor [rosuvastatin]    constipation   Penicillins Itching   Has patient had a PCN reaction causing immediate rash, facial/tongue/throat swelling, SOB or lightheadedness with hypotension: No Has patient had a PCN reaction causing severe rash involving mucus membranes or skin necrosis: No Has patient had a PCN reaction that required hospitalization No Has patient had a PCN reaction occurring within the last 10 years: No If all of the above answers are "NO", then may proceed with Cephalosporin use.      Medication List    TAKE these medications   albuterol 108 (90 Base) MCG/ACT inhaler Commonly known as:  PROVENTIL HFA;VENTOLIN HFA Inhale into the lungs every 6 (six) hours as needed for wheezing or shortness of breath.   amLODipine-benazepril 5-10 MG capsule Commonly known as:  LOTREL Take 1 capsule by mouth at bedtime.   aspirin EC 81 MG tablet Take 1 tablet (81 mg total) by mouth daily. What changed:  when to take this   fluticasone 50 MCG/ACT nasal spray Commonly known as:  FLONASE Place 1 spray into both nostrils daily as needed for allergies or rhinitis.   HYDROcodone-acetaminophen 5-325 MG tablet Commonly known as:  NORCO Take 1 tablet by mouth every 4 (four) hours as needed for severe pain. What changed:  Another medication with the same name was added. Make sure you understand how and when to take each.   HYDROcodone-acetaminophen 5-325 MG tablet Commonly known  as:  NORCO/VICODIN Take 1 tablet by mouth every 6 (six) hours as needed for moderate pain. What changed:  You were already taking a medication with the same name, and this prescription was added. Make sure you understand how and when to take each.   ondansetron 4 MG tablet Commonly known as:  ZOFRAN Take 1 tablet (4 mg total) by mouth every 8 (eight) hours as needed for nausea or vomiting.   pioglitazone 15 MG tablet Commonly known as:  ACTOS Take 15 mg by mouth at bedtime.   simvastatin 20 MG tablet Commonly known as:  ZOCOR Take 1 tablet (20 mg total) by mouth at bedtime.   Vitamin D3 2000 units capsule Take 2,000 Units by mouth daily.       Disposition: Discharge disposition: 01-Home or Self Care       Discharge Instructions    Diet - low sodium heart healthy   Complete by:  As directed    Increase activity slowly   Complete by:  As directed          Signed: Ovidio Kin, M.D., [facility] Office:  [phone]  07/03/2017, 7:57 AM

## 2017-07-03 NOTE — Progress Notes (Signed)
Discharge instructions discussed with patient and family, verbalized agreement and understanding, prescriptions given to patient 

## 2017-07-03 NOTE — Discharge Instructions (Signed)
[facility] - DISCHARGE INSTRUCTIONS TO PATIENT  Activity:  Driving - May drive in 2 or 3 days, if doing well.   Lifting - No lifting more than 15 pounds for one week, then no limit  Wound Care:   May shower starting tomorrow.  Diet:  As tolerated  Follow up appointment:  Call Dr. Jamse Mead office [facility]) at [phone] for an appointment in 2 to 3 weeks.  Medications and dosages:  Resume your home medications.  You have a prescription for:  Vicodin  Call Dr. Johna Sheriff or his office  [phone]) if you have:  Temperature greater than 100.4,  Persistent nausea and vomiting,  Severe uncontrolled pain,  Redness, tenderness, or signs of infection (pain, swelling, redness, odor or green/yellow discharge around the site),  Difficulty breathing, headache or visual disturbances,  Any other questions or concerns you may have after discharge.  In an emergency, call 911 or go to an Emergency Department at a nearby hospital.

## 2017-07-06 ENCOUNTER — Other Ambulatory Visit: Payer: Self-pay | Admitting: Pharmacist

## 2017-07-06 MED ORDER — PRAVASTATIN SODIUM 80 MG PO TABS: 80.0000 mg | ORAL_TABLET | Freq: Every evening | ORAL | 3 refills | Status: DC

## 2017-07-12 DIAGNOSIS — I8312 Varicose veins of left lower extremity with inflammation: Secondary | ICD-10-CM | POA: Diagnosis not present

## 2017-07-12 DIAGNOSIS — I8311 Varicose veins of right lower extremity with inflammation: Secondary | ICD-10-CM | POA: Diagnosis not present

## 2017-07-14 ENCOUNTER — Other Ambulatory Visit (HOSPITAL_COMMUNITY): Payer: Medicare Other

## 2017-07-21 ENCOUNTER — Ambulatory Visit: Admit: 2017-07-21 | Payer: Medicare Other | Admitting: General Surgery

## 2017-07-21 SURGERY — LAPAROSCOPIC CHOLECYSTECTOMY WITH INTRAOPERATIVE CHOLANGIOGRAM
Anesthesia: General

## 2017-07-25 ENCOUNTER — Other Ambulatory Visit: Payer: Medicare Other

## 2017-08-14 ENCOUNTER — Other Ambulatory Visit: Payer: Self-pay | Admitting: Pharmacist

## 2017-08-14 DIAGNOSIS — E7800 Pure hypercholesterolemia, unspecified: Secondary | ICD-10-CM

## 2017-08-14 NOTE — Progress Notes (Signed)
Pt tolerating prava well. Labs ordered for 09/12/17 to check efficacy.

## 2017-08-24 ENCOUNTER — Other Ambulatory Visit: Payer: Medicare Other

## 2017-09-12 ENCOUNTER — Other Ambulatory Visit: Payer: Medicare Other

## 2017-09-12 ENCOUNTER — Telehealth: Payer: Self-pay

## 2017-09-12 DIAGNOSIS — E7800 Pure hypercholesterolemia, unspecified: Secondary | ICD-10-CM

## 2017-09-12 LAB — LIPID PANEL
CHOL/HDL RATIO: 3.5 ratio (ref 0.0–4.4)
Cholesterol, Total: 189 mg/dL (ref 100–199)
HDL: 54 mg/dL (ref >39)
LDL Calculated: 118 mg/dL — ABNORMAL HIGH (ref 0–99)
Triglycerides: 86 mg/dL (ref 0–149)
VLDL CHOLESTEROL CAL: 17 mg/dL (ref 5–40)

## 2017-09-12 LAB — HEPATIC FUNCTION PANEL
ALT: 12 IU/L (ref 0–32)
AST: 15 IU/L (ref 0–40)
Albumin: 3.8 g/dL (ref 3.6–4.8)
Alkaline Phosphatase: 126 IU/L — ABNORMAL HIGH (ref 39–117)
BILIRUBIN TOTAL: 0.8 mg/dL (ref 0.0–1.2)
BILIRUBIN, DIRECT: 0.2 mg/dL (ref 0.00–0.40)
Total Protein: 6.7 g/dL (ref 6.0–8.5)

## 2017-09-12 NOTE — Telephone Encounter (Signed)
-----   Message from Quintella Reichertraci R Turner, MD sent at 09/12/2017  4:30 PM EDT ----- LDL is not at goal please find out if patient has tried Lipitor before and also refer these labs to her PCP due to elevated alkaline phosphatase

## 2017-09-12 NOTE — Telephone Encounter (Signed)
Add Zetia 10mg  daily and repeat FLP and ALT in 6 weeks

## 2017-09-12 NOTE — Telephone Encounter (Signed)
Notes recorded by Sigurd Sosapp, Coleen Cardiff, RN on 09/12/2017 at 4:58 PM EDT Spoke to patient with results/recommendations. She said that she tried Atorvastatin in the past, but it gave her physical pain. Please advise, thank you. ------

## 2017-09-13 MED ORDER — EZETIMIBE 10 MG PO TABS: 10.0000 mg | ORAL_TABLET | Freq: Every day | ORAL | 11 refills | Status: DC

## 2017-09-13 NOTE — Telephone Encounter (Signed)
I spoke with pt and gave her information from Dr. Mayford Knifeurner.  Pt will start Zetia and come in for fasting lab work on September 18,2019.  [facility] [address]

## 2017-10-04 DIAGNOSIS — R600 Localized edema: Secondary | ICD-10-CM | POA: Diagnosis not present

## 2017-10-06 ENCOUNTER — Telehealth: Payer: Self-pay

## 2017-10-06 DIAGNOSIS — E1159 Type 2 diabetes mellitus with other circulatory complications: Secondary | ICD-10-CM | POA: Diagnosis not present

## 2017-10-06 DIAGNOSIS — I10 Essential (primary) hypertension: Secondary | ICD-10-CM | POA: Diagnosis not present

## 2017-10-06 DIAGNOSIS — E559 Vitamin D deficiency, unspecified: Secondary | ICD-10-CM | POA: Diagnosis not present

## 2017-10-06 DIAGNOSIS — I872 Venous insufficiency (chronic) (peripheral): Secondary | ICD-10-CM | POA: Diagnosis not present

## 2017-10-06 DIAGNOSIS — Z6841 Body Mass Index (BMI) 40.0 and over, adult: Secondary | ICD-10-CM | POA: Diagnosis not present

## 2017-10-06 DIAGNOSIS — J309 Allergic rhinitis, unspecified: Secondary | ICD-10-CM | POA: Diagnosis not present

## 2017-10-06 DIAGNOSIS — I2510 Atherosclerotic heart disease of native coronary artery without angina pectoris: Secondary | ICD-10-CM | POA: Diagnosis not present

## 2017-10-06 DIAGNOSIS — E782 Mixed hyperlipidemia: Secondary | ICD-10-CM | POA: Diagnosis not present

## 2017-10-06 NOTE — Telephone Encounter (Signed)
Notes on file.  

## 2017-10-25 ENCOUNTER — Other Ambulatory Visit: Payer: Medicare Other | Admitting: *Deleted

## 2017-10-25 ENCOUNTER — Encounter (INDEPENDENT_AMBULATORY_CARE_PROVIDER_SITE_OTHER): Payer: Self-pay

## 2017-10-25 DIAGNOSIS — E7800 Pure hypercholesterolemia, unspecified: Secondary | ICD-10-CM | POA: Diagnosis not present

## 2017-10-25 LAB — LIPID PANEL
CHOL/HDL RATIO: 3.7 ratio (ref 0.0–4.4)
Cholesterol, Total: 187 mg/dL (ref 100–199)
HDL: 51 mg/dL (ref >39)
LDL Calculated: 117 mg/dL — ABNORMAL HIGH (ref 0–99)
Triglycerides: 93 mg/dL (ref 0–149)
VLDL Cholesterol Cal: 19 mg/dL (ref 5–40)

## 2017-10-25 LAB — ALT: ALT: 9 IU/L (ref 0–32)

## 2017-10-27 ENCOUNTER — Telehealth: Payer: Self-pay | Admitting: Pharmacist

## 2017-10-27 DIAGNOSIS — E7800 Pure hypercholesterolemia, unspecified: Secondary | ICD-10-CM

## 2017-10-27 MED ORDER — EZETIMIBE 10 MG PO TABS: 10.0000 mg | ORAL_TABLET | Freq: Every day | ORAL | 11 refills | Status: DC

## 2017-10-27 NOTE — Telephone Encounter (Signed)
Spoke with patient about lipid results. She states that she never started the ezetimibe because it was never filled at the pharmacy. She has been taking the pravastatin in the morning as well. Will move pravastatin to PM dosing and start ezetimibe. Recheck cholesterol panel in 2 months. Pt in agreement with this plan and aware to call if problems getting medication or issues tolerating.

## 2017-11-29 ENCOUNTER — Ambulatory Visit (INDEPENDENT_AMBULATORY_CARE_PROVIDER_SITE_OTHER): Payer: Medicare Other | Admitting: Cardiology

## 2017-11-29 ENCOUNTER — Encounter: Payer: Self-pay | Admitting: Cardiology

## 2017-11-29 VITALS — BP 144/94 | HR 77 | Ht 64.5 in | Wt 259.8 lb

## 2017-11-29 DIAGNOSIS — E7800 Pure hypercholesterolemia, unspecified: Secondary | ICD-10-CM | POA: Diagnosis not present

## 2017-11-29 DIAGNOSIS — I2510 Atherosclerotic heart disease of native coronary artery without angina pectoris: Secondary | ICD-10-CM

## 2017-11-29 DIAGNOSIS — I10 Essential (primary) hypertension: Secondary | ICD-10-CM | POA: Diagnosis not present

## 2017-11-29 MED ORDER — BENAZEPRIL HCL 20 MG PO TABS: 20.0000 mg | ORAL_TABLET | Freq: Every day | ORAL | 3 refills | Status: DC

## 2017-11-29 NOTE — Progress Notes (Signed)
Cardiology Office Note:    Date:  11/29/2017   ID:  [name], DOB 1951/02/01, MRN [id number]  PCP:  Angela Dimitri, MD  Cardiologist:  Armanda Magic, MD    Referring MD: Angela Dimitri, MD   Chief Complaint  Patient presents with  . Coronary Artery Disease  . Hypertension  . Hyperlipidemia    History of Present Illness:    Angela Morrison is a 67 y.o. female with a hx of HTN, ASCAD s/p PCI of the RCA in 2005 and dyslipidemia.  She is here today for followup and is doing well.  She denies any chest pain or pressure, SOB, DOE, PND, orthopnea, LE edema, dizziness, palpitations or syncope. She is compliant with her meds and is tolerating meds with no SE.    Past Medical History:  Diagnosis Date  . Anemia    few yrs ago none recent  . Arthritis   . Coronary artery disease 2005   S/P PCI of RCA  . Depression   . Diabetes mellitus without complication (HCC)    type 2  . Hyperlipidemia   . Hypertension   . Obesity   . PONV (postoperative nausea and vomiting)   . Thyroid nodule    S/P bx 8/13 c/w goiter    Past Surgical History:  Procedure Laterality Date  . appendectomy Right   . BACK SURGERY     lower back  . CHOLECYSTECTOMY N/A 07/02/2017   Procedure: LAPAROSCOPIC CHOLECYSTECTOMY WITH INTRAOPERATIVE CHOLANGIOGRAM;  Surgeon: Glenna Fellows, MD;  Location: [facility];  Service: General;  Laterality: N/A;  . COLONOSCOPY WITH PROPOFOL N/A 05/17/2016   Procedure: COLONOSCOPY WITH PROPOFOL;  Surgeon: Charolett Bumpers, MD;  Location: [facility];  Service: Endoscopy;  Laterality: N/A;    Current Medications: Current Meds  Medication Sig  . amLODipine-benazepril (LOTREL) 5-10 MG per capsule Take 1 capsule by mouth at bedtime.   Marland Kitchen aspirin EC 81 MG tablet Take 81 mg by mouth at bedtime.  . Cholecalciferol (VITAMIN D3) 2000 UNITS capsule Take 2,000 Units by mouth daily.   . fluticasone (FLONASE) 50 MCG/ACT nasal spray Place 1 spray into both nostrils daily as needed  for allergies or rhinitis.  Marland Kitchen ondansetron (ZOFRAN) 4 MG tablet Take 1 tablet (4 mg total) by mouth every 8 (eight) hours as needed for nausea or vomiting.  . pioglitazone (ACTOS) 15 MG tablet Take 15 mg by mouth at bedtime.      Allergies:   Crestor [rosuvastatin] and Penicillins   Social History   Socioeconomic History  . Marital status: Married    Spouse name: Not on file  . Number of children: Not on file  . Years of education: Not on file  . Highest education level: Not on file  Occupational History  . Not on file  Social Needs  . Financial resource strain: Not on file  . Food insecurity:    Worry: Not on file    Inability: Not on file  . Transportation needs:    Medical: Not on file    Non-medical: Not on file  Tobacco Use  . Smoking status: Never Smoker  . Smokeless tobacco: Never Used  Substance and Sexual Activity  . Alcohol use: Yes    Comment: occ  . Drug use: No  . Sexual activity: Not on file  Lifestyle  . Physical activity:    Days per week: Not on file    Minutes per session: Not on file  . Stress: Not on file  Relationships  . Social connections:    Talks on phone: Not on file    Gets together: Not on file    Attends religious service: Not on file    Active member of club or organization: Not on file    Attends meetings of clubs or organizations: Not on file    Relationship status: Not on file  Other Topics Concern  . Not on file  Social History Narrative  . Not on file     Family History: The patient's family history includes Alzheimer's disease in her mother; Dementia in her father.  ROS:   Please see the history of present illness.    ROS  All other systems reviewed and negative.   EKGs/Labs/Other Studies Reviewed:    The following studies were reviewed today: none  EKG:  EKG is not ordered today.   Recent Labs: 06/25/2017: BUN 8; Creatinine, Ser 0.75; Hemoglobin 14.3; Platelets 255; Potassium 3.7; Sodium 137 10/25/2017: ALT 9    Recent Lipid Panel    Component Value Date/Time   CHOL 187 10/25/2017 0756   TRIG 93 10/25/2017 0756   HDL 51 10/25/2017 0756   CHOLHDL 3.7 10/25/2017 0756   CHOLHDL 4 06/30/2014 0914   VLDL 16.8 06/30/2014 0914   LDLCALC 117 (H) 10/25/2017 0756    Physical Exam:    VS:  BP (!) 144/94   Pulse 77   Ht 5' 4.5" (1.638 m)   Wt 259 lb 12.8 oz (117.8 kg)   SpO2 97%   BMI 43.91 kg/m     Wt Readings from Last 3 Encounters:  11/29/17 259 lb 12.8 oz (117.8 kg)  07/02/17 259 lb (117.5 kg)  06/25/17 259 lb (117.5 kg)     GEN:  Well nourished, well developed in no acute distress HEENT: Normal NECK: No JVD; No carotid bruits LYMPHATICS: No lymphadenopathy CARDIAC: RRR, no murmurs, rubs, gallops RESPIRATORY:  Clear to auscultation without rales, wheezing or rhonchi  ABDOMEN: Soft, non-tender, non-distended MUSCULOSKELETAL:  No edema; No deformity  SKIN: Warm and dry NEUROLOGIC:  Alert and oriented x 3 PSYCHIATRIC:  Normal affect   ASSESSMENT:    1. Atherosclerosis of native coronary artery of native heart without angina pectoris   2. Essential hypertension, benign   3. Pure hypercholesterolemia    PLAN:    In order of problems listed above:  1.  ASCAD -  s/p PCI of the RCA in 2005.  She denies any angina sx.  She will continue on ASA 81mg  daily and statin.    2.  HTN - BP is well controlled on exam today.  She is having problems with swelling on the Lotrel and her lower extremities so we will stop her Lotrel and increase her benazepril to 20 mg daily.  I will check a bmet in 1 week.  Her creatinine was stable at 0.75 on 06/25/2017.  I have asked her to check her blood pressure once daily for a week and call me with the results to make sure her blood pressure is controlled.  3.  Hyperlipidemia - LDL goal is < 70.  Her LDL was 117 on 10/25/2017.  Zetia 10mg  daily was restarted after lipids last month but she did not tolerate it and stopped it on her own.  She will continue on  Pravachol 80mg  daily and I will refer her to lipid clinic.   Medication Adjustments/Labs and Tests Ordered: Current medicines are reviewed at length with the patient today.  Concerns regarding medicines are  outlined above.  No orders of the defined types were placed in this encounter.  No orders of the defined types were placed in this encounter.   Signed, Armanda Magic, MD  11/29/2017 10:12 AM    [facility]

## 2017-11-29 NOTE — Patient Instructions (Signed)
Medication Instructions:  Stop: Lotrel   Start: Benazepril 20 mg, daily, by mouth  If you need a refill on your cardiac medications before your next appointment, please call your pharmacy.   Lab work:Your physician recommends that you return for lab work in: 1 week for BMET.  If you have labs (blood work) drawn today and your tests are completely normal, you will receive your results only by: Marland Kitchen MyChart Message (if you have MyChart) OR . A paper copy in the mail If you have any lab test that is abnormal or we need to change your treatment, we will call you to review the results.  Follow-Up: At [facility], you and your health needs are our priority.  As part of our continuing mission to provide you with exceptional heart care, we have created designated Provider Care Teams.  These Care Teams include your primary Cardiologist (physician) and Advanced Practice Providers (APPs -  Physician Assistants and Nurse Practitioners) who all work together to provide you with the care you need, when you need it.  You have been referred to Lipid Clinic.  You will need a follow up appointment in 1 years.  Please call our office 2 months in advance to schedule this appointment.  You may see Armanda Magic, MD or one of the following Advanced Practice Providers on your designated Care Team:   [place], PA-C Ronie Spies, PA-C . Jacolyn Reedy, PA-C  Additional Information: Take Blood pressure daily, in the morning, 1 hour after taking your blood pressure medications for 1 week. Call the office to report the results.

## 2017-12-06 ENCOUNTER — Other Ambulatory Visit: Payer: Medicare Other | Admitting: *Deleted

## 2017-12-06 DIAGNOSIS — I2510 Atherosclerotic heart disease of native coronary artery without angina pectoris: Secondary | ICD-10-CM

## 2017-12-06 DIAGNOSIS — I10 Essential (primary) hypertension: Secondary | ICD-10-CM

## 2017-12-06 LAB — BASIC METABOLIC PANEL
BUN/Creatinine Ratio: 14 (ref 12–28)
BUN: 10 mg/dL (ref 8–27)
CO2: 25 mmol/L (ref 20–29)
CREATININE: 0.72 mg/dL (ref 0.57–1.00)
Calcium: 9.7 mg/dL (ref 8.7–10.3)
Chloride: 103 mmol/L (ref 96–106)
GFR calc Af Amer: 100 mL/min/{1.73_m2} (ref >59)
GFR, EST NON AFRICAN AMERICAN: 87 mL/min/{1.73_m2} (ref >59)
GLUCOSE: 84 mg/dL (ref 65–99)
Potassium: 4 mmol/L (ref 3.5–5.2)
SODIUM: 144 mmol/L (ref 134–144)

## 2017-12-19 ENCOUNTER — Ambulatory Visit (INDEPENDENT_AMBULATORY_CARE_PROVIDER_SITE_OTHER): Payer: Medicare Other | Admitting: Pharmacist

## 2017-12-19 DIAGNOSIS — I2510 Atherosclerotic heart disease of native coronary artery without angina pectoris: Secondary | ICD-10-CM | POA: Diagnosis not present

## 2017-12-19 DIAGNOSIS — E785 Hyperlipidemia, unspecified: Secondary | ICD-10-CM | POA: Diagnosis not present

## 2017-12-19 NOTE — Progress Notes (Signed)
Patient ID: Angela Altolizabeth G Elting                 DOB: 1951/01/22                    MRN: [id number]     HPI: Angela Morrison is a 67 y.o. female patient of Dr. Mayford Knifeurner that presents today for lipid evaluation.  PMH includes HTN, ASCAD s/p PCI of the RCA in 2005 and dyslipidemia. She has previously been on pravastatin and ezetimibe.   She presents today for cholesterol discussion. Since her gallbladder removal she feels bad if she eats too much fat. She reports that she was unable to tolerate ezetimibe due to aching. She states she has been taking pravastatin 80mg  a few times a week only as she is unable to tolerate every day due to aching. She is unsure if she will be able to tolerate taking pravastatin every day, but she is willing to try this.   Risk Factors: CAD s/p Stenting LDL Goal: <70  Current Medications: pravastatin 80mg  daily in the evening (2-3 times per week) Intolerances: rosuvastatin (stiffness and aches), ezetimibe (stiffness and aches), pravastatin 80mg  every day (stiffness and aches), Lipitor (horrible aches), Zocor (stiffness and aches)  Diet: She eats out mostly from fast food. She had gallbladder surgery in summer and ate better prior to this. She eats mostly chicken (fried or grilled). She does eat vegetables regularly. She does eat a lot of carbs. She drinks mostly fanta orange and tea or lemonade.   Exercise: She was exercising prior to surgery.   Family History: Alzheimer's disease in her mother; Dementia in her father.  Social History: denies tobacco, occasional alcohol  Labs: 10/24/17: TC 187, TG 93, HDL 51, LDL 117 (pravastatin 80mg  daily)  Past Medical History:  Diagnosis Date  . Anemia    few yrs ago none recent  . Arthritis   . Coronary artery disease 2005   S/P PCI of RCA  . Depression   . Diabetes mellitus without complication (HCC)    type 2  . Hyperlipidemia   . Hypertension   . Obesity   . PONV (postoperative nausea and vomiting)   . Thyroid  nodule    S/P bx 8/13 c/w goiter    Current Outpatient Medications on File Prior to Visit  Medication Sig Dispense Refill  . aspirin EC 81 MG tablet Take 81 mg by mouth at bedtime.    . benazepril (LOTENSIN) 20 MG tablet Take 1 tablet (20 mg total) by mouth daily. 90 tablet 3  . Cholecalciferol (VITAMIN D3) 2000 UNITS capsule Take 2,000 Units by mouth daily.     . fluticasone (FLONASE) 50 MCG/ACT nasal spray Place 1 spray into both nostrils daily as needed for allergies or rhinitis.    Marland Kitchen. ondansetron (ZOFRAN) 4 MG tablet Take 1 tablet (4 mg total) by mouth every 8 (eight) hours as needed for nausea or vomiting. 10 tablet 0  . pioglitazone (ACTOS) 15 MG tablet Take 15 mg by mouth at bedtime.     . pravastatin (PRAVACHOL) 80 MG tablet Take 1 tablet (80 mg total) by mouth every evening. 90 tablet 3   No current facility-administered medications on file prior to visit.     Allergies  Allergen Reactions  . Crestor [Rosuvastatin]     constipation  . Penicillins Itching    Has patient had a PCN reaction causing immediate rash, facial/tongue/throat swelling, SOB or lightheadedness with hypotension: No Has patient had  a PCN reaction causing severe rash involving mucus membranes or skin necrosis: No Has patient had a PCN reaction that required hospitalization No Has patient had a PCN reaction occurring within the last 10 years: No If all of the above answers are "NO", then may proceed with Cephalosporin use.    Assessment/Plan: Hyperlipidemia: LDL is not at goal. Will have her take pravastatin 40mg  daily (1/2 tablet of her current supply). Extensively discussed diet and exercise as it relates to cholesterol and she will work on this as well. It is unlikely that pravastatin alone will bring LDL to goal and she has been intolerant to ezetimibe; thus, will pursue PCSK9i therapy. Discussed injection technique, risk/benefit, and cost obligation. She does not believe that she will qualify for patient  assistance with her income.    Thank you,  Freddie Apley. Cleatis Polka, PharmD  [facility]  12/19/2017 8:07 AM

## 2017-12-19 NOTE — Patient Instructions (Signed)
We will send for coverage of PCSK9i therapy (Praluent or Repatha depending on insurance coverage). We will call you once we have approval.   If you have any questions or concerns please call the clinic [phone].   Cholesterol Cholesterol is a fat. Your body needs a small amount of cholesterol. Cholesterol (plaque) may build up in your blood vessels (arteries). That makes you more likely to have a heart attack or stroke. You cannot feel your cholesterol level. Having a blood test is the only way to find out if your level is high. Keep your test results. Work with your doctor to keep your cholesterol at a good level. What do the results mean?  Total cholesterol is how much cholesterol is in your blood.  LDL is bad cholesterol. This is the type that can build up. Try to have low LDL.  HDL is good cholesterol. It cleans your blood vessels and carries LDL away. Try to have high HDL.  Triglycerides are fat that the body can store or burn for energy. What are good levels of cholesterol?  Total cholesterol below 200.  LDL below 100 is good for people who have health risks. LDL below 70 is good for people who have very high risks.  HDL above 40 is good. It is best to have HDL of 60 or higher.  Triglycerides below 150. How can I lower my cholesterol? Diet Follow your diet program as told by your doctor.  Choose fish, white meat chicken, or [place] that is roasted or baked. Try not to eat red meat, fried foods, sausage, or lunch meats.  Eat lots of fresh fruits and vegetables.  Choose whole grains, beans, pasta, potatoes, and cereals.  Choose olive oil, corn oil, or canola oil. Only use small amounts.  Try not to eat butter, mayonnaise, shortening, or palm kernel oils.  Try not to eat foods with trans fats.  Choose low-fat or nonfat dairy foods. ? Drink skim or nonfat milk. ? Eat low-fat or nonfat yogurt and cheeses. ? Try not to drink whole milk or cream. ? Try not to eat ice  cream, egg yolks, or full-fat cheeses.  Healthy desserts include angel food cake, ginger snaps, animal crackers, hard candy, popsicles, and low-fat or nonfat frozen yogurt. Try not to eat pastries, cakes, pies, and cookies.  Exercise Follow your exercise program as told by your doctor.  Be more active. Try gardening, walking, and taking the stairs.  Ask your doctor about ways that you can be more active.  Medicine  Take over-the-counter and prescription medicines only as told by your doctor. This information is not intended to replace advice given to you by your health care provider. Make sure you discuss any questions you have with your health care provider. Document Released: 04/22/2008 Document Revised: 08/26/2015 Document Reviewed: 08/06/2015 Elsevier Interactive Patient Education  [employer]2018 [employer].

## 2017-12-20 ENCOUNTER — Encounter: Payer: Self-pay | Admitting: Pharmacist

## 2017-12-21 ENCOUNTER — Telehealth: Payer: Self-pay | Admitting: Pharmacist

## 2017-12-21 MED ORDER — EVOLOCUMAB 140 MG/ML ~~LOC~~ SOAJ: 140.0000 mg | SUBCUTANEOUS | 11 refills | Status: DC

## 2017-12-21 NOTE — Telephone Encounter (Signed)
This is the new NDC for Repatha and the other NDC is more expensive.

## 2017-12-21 NOTE — Telephone Encounter (Signed)
Pt approved for Repatha through insurance. RX sent to pharmacy for cost determination.  

## 2017-12-21 NOTE — Telephone Encounter (Signed)
Called patient to inform her of copay on Repatha ($381). No answer, left message.

## 2017-12-25 NOTE — Telephone Encounter (Signed)
Spoke with patient and made aware of price. Discussed options and income too high for patient assistance. Also discussed clinical trial. Pt would like to think about options and call us back with decision.   Will plan to follow up in a few weeks if have not heard back.

## 2017-12-26 ENCOUNTER — Other Ambulatory Visit: Payer: Medicare Other

## 2017-12-26 DIAGNOSIS — E7800 Pure hypercholesterolemia, unspecified: Secondary | ICD-10-CM | POA: Diagnosis not present

## 2017-12-26 LAB — HEPATIC FUNCTION PANEL
ALK PHOS: 127 IU/L — AB (ref 39–117)
ALT: 9 IU/L (ref 0–32)
AST: 14 IU/L (ref 0–40)
Albumin: 4.1 g/dL (ref 3.6–4.8)
BILIRUBIN, DIRECT: 0.21 mg/dL (ref 0.00–0.40)
Bilirubin Total: 1 mg/dL (ref 0.0–1.2)
TOTAL PROTEIN: 7.2 g/dL (ref 6.0–8.5)

## 2017-12-26 LAB — LIPID PANEL
CHOLESTEROL TOTAL: 184 mg/dL (ref 100–199)
Chol/HDL Ratio: 3.2 ratio (ref 0.0–4.4)
HDL: 58 mg/dL (ref >39)
LDL Calculated: 107 mg/dL — ABNORMAL HIGH (ref 0–99)
TRIGLYCERIDES: 94 mg/dL (ref 0–149)
VLDL Cholesterol Cal: 19 mg/dL (ref 5–40)

## 2018-01-01 ENCOUNTER — Telehealth: Payer: Self-pay | Admitting: Student-PharmD

## 2018-01-01 MED ORDER — ALIROCUMAB 150 MG/ML ~~LOC~~ SOAJ: 150.0000 mg | SUBCUTANEOUS | 11 refills | Status: DC

## 2018-01-01 NOTE — Telephone Encounter (Signed)
Called patient to inform her of copay for Praluent [phone]($557) and to discuss options for LDL lowering treatment outside of FDA approved medications (wait for bempidoic acid, clinical trials). Patient states that she is wanting to continue pravastatin 80mg  daily and try to control cholesterol with diet and exercise. Will put her on the list for bempidoic acid.

## 2018-01-01 NOTE — Telephone Encounter (Signed)
Agree with assessment and plan as documented.  

## 2018-05-17 ENCOUNTER — Other Ambulatory Visit: Payer: Self-pay | Admitting: Pharmacist

## 2018-05-25 NOTE — Addendum Note (Signed)
Addended by: Ziad Maye E on: 05/25/2018 11:47 AM   Modules accepted: Orders

## 2018-06-30 DIAGNOSIS — B342 Coronavirus infection, unspecified: Secondary | ICD-10-CM | POA: Diagnosis not present

## 2018-06-30 DIAGNOSIS — Z20828 Contact with and (suspected) exposure to other viral communicable diseases: Secondary | ICD-10-CM | POA: Diagnosis not present

## 2018-06-30 DIAGNOSIS — R0602 Shortness of breath: Secondary | ICD-10-CM | POA: Diagnosis not present

## 2018-07-04 DIAGNOSIS — R0602 Shortness of breath: Secondary | ICD-10-CM | POA: Diagnosis not present

## 2018-09-07 ENCOUNTER — Telehealth: Payer: Self-pay

## 2018-09-07 NOTE — Telephone Encounter (Signed)
Called and spoke w/the patient regarding starting repatha and the pt stated that she wants to think about it. The pt expressed concerns regarding the side effects and went through them with the patient. I also told the patient that the [employer] could get the med cheaper or perhaps even free of charge to the pt but they decided they wanted to wait and call the clinic back when they are ready to proceed w/ repatha

## 2018-12-17 DIAGNOSIS — J309 Allergic rhinitis, unspecified: Secondary | ICD-10-CM | POA: Diagnosis not present

## 2018-12-17 DIAGNOSIS — E1159 Type 2 diabetes mellitus with other circulatory complications: Secondary | ICD-10-CM | POA: Diagnosis not present

## 2018-12-17 DIAGNOSIS — I10 Essential (primary) hypertension: Secondary | ICD-10-CM | POA: Diagnosis not present

## 2018-12-17 DIAGNOSIS — E559 Vitamin D deficiency, unspecified: Secondary | ICD-10-CM | POA: Diagnosis not present

## 2018-12-17 DIAGNOSIS — Z23 Encounter for immunization: Secondary | ICD-10-CM | POA: Diagnosis not present

## 2018-12-17 DIAGNOSIS — M75101 Unspecified rotator cuff tear or rupture of right shoulder, not specified as traumatic: Secondary | ICD-10-CM | POA: Diagnosis not present

## 2018-12-17 DIAGNOSIS — M85851 Other specified disorders of bone density and structure, right thigh: Secondary | ICD-10-CM | POA: Diagnosis not present

## 2018-12-17 DIAGNOSIS — E669 Obesity, unspecified: Secondary | ICD-10-CM | POA: Diagnosis not present

## 2018-12-17 DIAGNOSIS — E782 Mixed hyperlipidemia: Secondary | ICD-10-CM | POA: Diagnosis not present

## 2018-12-17 DIAGNOSIS — I2510 Atherosclerotic heart disease of native coronary artery without angina pectoris: Secondary | ICD-10-CM | POA: Diagnosis not present

## 2018-12-17 DIAGNOSIS — Z0001 Encounter for general adult medical examination with abnormal findings: Secondary | ICD-10-CM | POA: Diagnosis not present

## 2018-12-17 DIAGNOSIS — Z1389 Encounter for screening for other disorder: Secondary | ICD-10-CM | POA: Diagnosis not present

## 2018-12-19 DIAGNOSIS — E559 Vitamin D deficiency, unspecified: Secondary | ICD-10-CM | POA: Diagnosis not present

## 2018-12-19 DIAGNOSIS — E1159 Type 2 diabetes mellitus with other circulatory complications: Secondary | ICD-10-CM | POA: Diagnosis not present

## 2018-12-19 DIAGNOSIS — I10 Essential (primary) hypertension: Secondary | ICD-10-CM | POA: Diagnosis not present

## 2018-12-19 DIAGNOSIS — R0602 Shortness of breath: Secondary | ICD-10-CM | POA: Diagnosis not present

## 2018-12-19 DIAGNOSIS — G479 Sleep disorder, unspecified: Secondary | ICD-10-CM | POA: Diagnosis not present

## 2018-12-19 DIAGNOSIS — B342 Coronavirus infection, unspecified: Secondary | ICD-10-CM | POA: Diagnosis not present

## 2018-12-19 DIAGNOSIS — Z1389 Encounter for screening for other disorder: Secondary | ICD-10-CM | POA: Diagnosis not present

## 2018-12-19 DIAGNOSIS — E782 Mixed hyperlipidemia: Secondary | ICD-10-CM | POA: Diagnosis not present

## 2018-12-19 DIAGNOSIS — Z0001 Encounter for general adult medical examination with abnormal findings: Secondary | ICD-10-CM | POA: Diagnosis not present

## 2018-12-19 DIAGNOSIS — K8021 Calculus of gallbladder without cholecystitis with obstruction: Secondary | ICD-10-CM | POA: Diagnosis not present

## 2018-12-19 DIAGNOSIS — R110 Nausea: Secondary | ICD-10-CM | POA: Diagnosis not present

## 2018-12-19 DIAGNOSIS — Z0000 Encounter for general adult medical examination without abnormal findings: Secondary | ICD-10-CM | POA: Diagnosis not present

## 2019-03-09 ENCOUNTER — Other Ambulatory Visit: Payer: Self-pay | Admitting: Cardiology

## 2019-04-16 ENCOUNTER — Other Ambulatory Visit: Payer: Self-pay | Admitting: Cardiology

## 2019-04-17 ENCOUNTER — Other Ambulatory Visit: Payer: Self-pay | Admitting: Cardiology

## 2019-04-17 MED ORDER — BENAZEPRIL HCL 20 MG PO TABS: 20.0000 mg | ORAL_TABLET | Freq: Every day | ORAL | 0 refills | Status: DC

## 2019-04-17 NOTE — Telephone Encounter (Signed)
*  STAT* If patient is at the pharmacy, call can be transferred to refill team.   1. Which medications need to be refilled? (please list name of each medication and dose if known)  benazepril (LOTENSIN) 20 MG tablet  2. Which pharmacy/location (including street and city if local pharmacy) is medication to be sent to? [id number] - [place] ([place]), [place] - [address]  3. Do they need a 30 day or 90 day supply? 90 day supply  Patient is currently out of medication. She has an appt scheduled with Herma Carson on 05/15/19.

## 2019-05-06 NOTE — Progress Notes (Signed)
Cardiology Office Note    Date:  05/15/2019   ID:  Angela Morrison, DOB 19-Apr-1950, MRN [id number]  PCP:  Angela Caraway, MD  Cardiologist: Fransico Him, MD EPS: None  No chief complaint on file.   History of Present Illness:  Angela Morrison is a 69 y.o. female with a hx of HTN, ASCAD s/p PCI of the RCA in 2005 and dyslipidemia.   Last office visit with Dr. Radford Pax 11/29/2017 she was having swelling on Lotrel so she stopped it and increased her benazepril to 20 mg daily.  Was not tolerating Zetia in addition to Pravachol so was referred to the lipid clinic.  PCSK9 inhibitors were recommended but have not been started.  Patient comes in for yearly f/u. Denies chest pain, dyspnea, dizziness, palpitations, edema. No regular exercise. PCP changed pravachol to atorvastatin 6 months ago. She had lipids checked  In Feb but don't have LDL. BP up today. She doesn't check it regularly. Received [phone] vaccine.  Past Medical History:  Diagnosis Date  . Anemia    few yrs ago none recent  . Arthritis   . Coronary artery disease 2005   S/P PCI of RCA  . Depression   . Diabetes mellitus without complication ([place])    type 2  . Hyperlipidemia   . Hypertension   . Obesity   . PONV (postoperative nausea and vomiting)   . Thyroid nodule    S/P bx 8/13 c/w goiter    Past Surgical History:  Procedure Laterality Date  . appendectomy Right   . BACK SURGERY     lower back  . CHOLECYSTECTOMY N/A 07/02/2017   Procedure: LAPAROSCOPIC CHOLECYSTECTOMY WITH INTRAOPERATIVE CHOLANGIOGRAM;  Surgeon: Excell Seltzer, MD;  Location: [facility];  Service: General;  Laterality: N/A;  . COLONOSCOPY WITH PROPOFOL N/A 05/17/2016   Procedure: COLONOSCOPY WITH PROPOFOL;  Surgeon: Garlan Fair, MD;  Location: [facility];  Service: Endoscopy;  Laterality: N/A;    Current Medications: Current Meds  Medication Sig  . aspirin EC 81 MG tablet Take 81 mg by mouth at bedtime.  Marland Kitchen atorvastatin (LIPITOR) 40  MG tablet Take 40 mg by mouth daily.  . benazepril (LOTENSIN) 40 MG tablet Take 1 tablet (40 mg total) by mouth daily.  . Cholecalciferol (VITAMIN D3) 2000 UNITS capsule Take 2,000 Units by mouth daily.   . fluticasone (FLONASE) 50 MCG/ACT nasal spray Place 1 spray into both nostrils daily as needed for allergies or rhinitis.  Marland Kitchen ondansetron (ZOFRAN) 4 MG tablet Take 1 tablet (4 mg total) by mouth every 8 (eight) hours as needed for nausea or vomiting.  . pioglitazone (ACTOS) 15 MG tablet Take 15 mg by mouth at bedtime.   . [DISCONTINUED] benazepril (LOTENSIN) 20 MG tablet Take 1 tablet (20 mg total) by mouth daily. Please keep upcoming appt for refills. Thank you     Allergies:   Crestor [rosuvastatin] and Penicillins   Social History   Socioeconomic History  . Marital status: Married    Spouse name: Not on file  . Number of children: Not on file  . Years of education: Not on file  . Highest education level: Not on file  Occupational History  . Not on file  Tobacco Use  . Smoking status: Never Smoker  . Smokeless tobacco: Never Used  Substance and Sexual Activity  . Alcohol use: Yes    Comment: occ  . Drug use: No  . Sexual activity: Not on file  Other Topics Concern  .  Not on file  Social History Narrative  . Not on file   Social Determinants of Health   Financial Resource Strain:   . Difficulty of Paying Living Expenses:   Food Insecurity:   . Worried About Programme researcher, broadcasting/film/video in the Last Year:   . Barista in the Last Year:   Transportation Needs:   . Freight forwarder (Medical):   Marland Kitchen Lack of Transportation (Non-Medical):   Physical Activity:   . Days of Exercise per Week:   . Minutes of Exercise per Session:   Stress:   . Feeling of Stress :   Social Connections:   . Frequency of Communication with Friends and Family:   . Frequency of Social Gatherings with Friends and Family:   . Attends Religious Services:   . Active Member of Clubs or  Organizations:   . Attends Banker Meetings:   Marland Kitchen Marital Status:      Family History:  The patient's family history includes Alzheimer's disease in her mother; Dementia in her father.   ROS:   Please see the history of present illness.    ROS All other systems reviewed and are negative.   PHYSICAL EXAM:   VS:  BP (!) 150/94   Pulse 88   Ht 5' 4.5" (1.638 m)   Wt 271 lb 1.9 oz (123 kg)   SpO2 96%   BMI 45.82 kg/m   Physical Exam  GEN: Obese, in no acute distress  Neck: no JVD, carotid bruits, or masses Cardiac:RRR; no murmurs, rubs, or gallops  Respiratory:  clear to auscultation bilaterally, normal work of breathing GI: soft, nontender, nondistended, + BS Ext: without cyanosis, clubbing, or edema, Good distal pulses bilaterally Neuro:  Alert and Oriented x 3 Psych: euthymic mood, full affect  Wt Readings from Last 3 Encounters:  05/15/19 271 lb 1.9 oz (123 kg)  11/29/17 259 lb 12.8 oz (117.8 kg)  07/02/17 259 lb (117.5 kg)      Studies/Labs Reviewed:   EKG:  EKG is  ordered today.  The ekg ordered today demonstrates NSR, left axis, nonspecific ST changes, unchanged from prior EKG's  Recent Labs: No results found for requested labs within last 8760 hours.   Lipid Panel    Component Value Date/Time   CHOL 184 12/26/2017 0748   TRIG 94 12/26/2017 0748   HDL 58 12/26/2017 0748   CHOLHDL 3.2 12/26/2017 0748   CHOLHDL 4 06/30/2014 0914   VLDL 16.8 06/30/2014 0914   LDLCALC 107 (H) 12/26/2017 0748    Additional studies/ records that were reviewed today include:  NST 05/30/2017  Nuclear stress EF: 64%.  Blood pressure demonstrated a hypertensive response to exercise.  There was no ST segment deviation noted during stress.  Defect 1: There is a medium defect of mild severity present in the mid anterior and apical anterior location.  This is a low risk study.  The left ventricular ejection fraction is normal (55-65%).   Low risk stress nuclear  study with probable shifting breast attenuation and no significant ischemia; EF 64 with normal wall motion.       ASSESSMENT:    1. Coronary artery disease involving native coronary artery of native heart without angina pectoris   2. Essential hypertension   3. Hyperlipidemia, unspecified hyperlipidemia type   4. Obesity, unspecified classification, unspecified obesity type, unspecified whether serious comorbidity present      PLAN:  In order of problems listed above:  CAD status  post PCI of the RCA in 2005, low risk NST 05/30/2017 on aspirin and statin-recently changed. No angina. Recommend 150 min regular exercise weekly  Essential hypertension BP high will increase lotensin 40 mg once daily. Recheck bmet in 2-3 weeks with f/u.  Hyperlipidemia LDL 107 12/26/2017 was supposed to start PCSK9 inhibitor- changed to atorvastatin by PCP 6 months ago and had Lipids in Feb. Will try to get.  Obesity-has gained 10 lbs over the past year. Refer to weight loss clinic    Medication Adjustments/Labs and Tests Ordered: Current medicines are reviewed at length with the patient today.  Concerns regarding medicines are outlined above.  Medication changes, Labs and Tests ordered today are listed in the Patient Instructions below. Patient Instructions  Medication Instructions:  Your physician has recommended you make the following change in your medication:   INCREASE: benazepril (lotensin) to 40 mg once a day  *If you need a refill on your cardiac medications before your next appointment, please call your pharmacy*   Lab Work: Your physician recommends that you return for lab work (BMET) on 06/05/19  If you have labs (blood work) drawn today and your tests are completely normal, you will receive your results only by: Marland Kitchen MyChart Message (if you have MyChart) OR . A paper copy in the mail If you have any lab test that is abnormal or we need to change your treatment, we will call you to review  the results.   Testing/Procedures: None ordered   Follow-Up: Follow up with Jacolyn Reedy, PA on 06/05/19 at 8:45 AM  You have been referred to Medical Weight Management   Other Instructions  1. Your provider recommends that you maintain 150 minutes per week of moderate aerobic activity.  2. Your physician has requested that you regularly monitor and record your blood pressure readings at home. Please use the same machine at the same time of day to check your readings and record them to bring to your follow-up visit.   Low-Sodium Eating Plan Sodium, which is an element that makes up salt, helps you maintain a healthy balance of fluids in your body. Too much sodium can increase your blood pressure and cause fluid and waste to be held in your body. Your health care provider or dietitian may recommend following this plan if you have high blood pressure (hypertension), kidney disease, liver disease, or heart failure. Eating less sodium can help lower your blood pressure, reduce swelling, and protect your heart, liver, and kidneys. What are tips for following this plan? General guidelines  Most people on this plan should limit their sodium intake to 1,500-2,000 mg (milligrams) of sodium each day. Reading food labels   The Nutrition Facts label lists the amount of sodium in one serving of the food. If you eat more than one serving, you must multiply the listed amount of sodium by the number of servings.  Choose foods with less than 140 mg of sodium per serving.  Avoid foods with 300 mg of sodium or more per serving. Shopping  Look for lower-sodium products, often labeled as "low-sodium" or "no salt added."  Always check the sodium content even if foods are labeled as "unsalted" or "no salt added".  Buy fresh foods. ? Avoid canned foods and premade or frozen meals. ? Avoid canned, cured, or processed meats  Buy breads that have less than 80 mg of sodium per slice. Cooking  Eat  more home-cooked food and less restaurant, buffet, and fast food.  Avoid adding  salt when cooking. Use salt-free seasonings or herbs instead of table salt or sea salt. Check with your health care provider or pharmacist before using salt substitutes.  Cook with plant-based oils, such as canola, sunflower, or olive oil. Meal planning  When eating at a restaurant, ask that your food be prepared with less salt or no salt, if possible.  Avoid foods that contain MSG (monosodium glutamate). MSG is sometimes added to [place] food, bouillon, and some canned foods. What foods are recommended? The items listed may not be a complete list. Talk with your dietitian about what dietary choices are best for you. Grains Low-sodium cereals, including oats, puffed wheat and rice, and shredded wheat. Low-sodium crackers. Unsalted rice. Unsalted pasta. Low-sodium bread. Whole-grain breads and whole-grain pasta. Vegetables Fresh or frozen vegetables. "No salt added" canned vegetables. "No salt added" tomato sauce and paste. Low-sodium or reduced-sodium tomato and vegetable juice. Fruits Fresh, frozen, or canned fruit. Fruit juice. Meats and other protein foods Fresh or frozen (no salt added) meat, poultry, seafood, and fish. Low-sodium canned tuna and salmon. Unsalted nuts. Dried peas, beans, and lentils without added salt. Unsalted canned beans. Eggs. Unsalted nut butters. Dairy Milk. Soy milk. Cheese that is naturally low in sodium, such as ricotta cheese, fresh mozzarella, or Swiss cheese Low-sodium or reduced-sodium cheese. Cream cheese. Yogurt. Fats and oils Unsalted butter. Unsalted margarine with no trans fat. Vegetable oils such as canola or olive oils. Seasonings and other foods Fresh and dried herbs and spices. Salt-free seasonings. Low-sodium mustard and ketchup. Sodium-free salad dressing. Sodium-free light mayonnaise. Fresh or refrigerated horseradish. Lemon juice. Vinegar. Homemade, reduced-sodium,  or low-sodium soups. Unsalted popcorn and pretzels. Low-salt or salt-free chips. What foods are not recommended? The items listed may not be a complete list. Talk with your dietitian about what dietary choices are best for you. Grains Instant hot cereals. Bread stuffing, pancake, and biscuit mixes. Croutons. Seasoned rice or pasta mixes. Noodle soup cups. Boxed or frozen macaroni and cheese. Regular salted crackers. Self-rising flour. Vegetables Sauerkraut, pickled vegetables, and relishes. Olives. [place] fries. Onion rings. Regular canned vegetables (not low-sodium or reduced-sodium). Regular canned tomato sauce and paste (not low-sodium or reduced-sodium). Regular tomato and vegetable juice (not low-sodium or reduced-sodium). Frozen vegetables in sauces. Meats and other protein foods Meat or fish that is salted, canned, smoked, spiced, or pickled. Bacon, ham, sausage, hotdogs, corned beef, chipped beef, packaged lunch meats, salt pork, jerky, pickled herring, anchovies, regular canned tuna, sardines, salted nuts. Dairy Processed cheese and cheese spreads. Cheese curds. Blue cheese. Feta cheese. String cheese. Regular cottage cheese. Buttermilk. Canned milk. Fats and oils Salted butter. Regular margarine. Ghee. Bacon fat. Seasonings and other foods Onion salt, garlic salt, seasoned salt, table salt, and sea salt. Canned and packaged gravies. Worcestershire sauce. Tartar sauce. Barbecue sauce. Teriyaki sauce. Soy sauce, including reduced-sodium. Steak sauce. Fish sauce. Oyster sauce. Cocktail sauce. Horseradish that you find on the shelf. Regular ketchup and mustard. Meat flavorings and tenderizers. Bouillon cubes. Hot sauce and Tabasco sauce. Premade or packaged marinades. Premade or packaged taco seasonings. Relishes. Regular salad dressings. Salsa. Potato and tortilla chips. Corn chips and puffs. Salted popcorn and pretzels. Canned or dried soups. Pizza. Frozen entrees and pot  pies. Summary  Eating less sodium can help lower your blood pressure, reduce swelling, and protect your heart, liver, and kidneys.  Most people on this plan should limit their sodium intake to 1,500-2,000 mg (milligrams) of sodium each day.  Canned, boxed, and frozen foods are  high in sodium. Restaurant foods, fast foods, and pizza are also very high in sodium. You also get sodium by adding salt to food.  Try to cook at home, eat more fresh fruits and vegetables, and eat less fast food, canned, processed, or prepared foods. This information is not intended to replace advice given to you by your health care provider. Make sure you discuss any questions you have with your health care provider. Document Revised: 01/06/2017 Document Reviewed: 01/18/2016 Elsevier Patient Education  [address]2020 Elsevier Inc.       Signed, Jacolyn ReedyMichele Vergia Chea, [place] JerseyPA-C  05/15/2019 8:47 AM    [facility] [address], [place], [place]  [id number] Phone: [phone]) [phone]; Fax: [phone]) [phone]

## 2019-05-15 ENCOUNTER — Ambulatory Visit (INDEPENDENT_AMBULATORY_CARE_PROVIDER_SITE_OTHER): Payer: Medicare Other | Admitting: Physician Assistant

## 2019-05-15 ENCOUNTER — Encounter: Payer: Self-pay | Admitting: Physician Assistant

## 2019-05-15 ENCOUNTER — Other Ambulatory Visit: Payer: Self-pay

## 2019-05-15 VITALS — BP 150/94 | HR 88 | Ht 64.5 in | Wt 271.1 lb

## 2019-05-15 DIAGNOSIS — I2510 Atherosclerotic heart disease of native coronary artery without angina pectoris: Secondary | ICD-10-CM | POA: Diagnosis not present

## 2019-05-15 DIAGNOSIS — E669 Obesity, unspecified: Secondary | ICD-10-CM

## 2019-05-15 DIAGNOSIS — E785 Hyperlipidemia, unspecified: Secondary | ICD-10-CM | POA: Diagnosis not present

## 2019-05-15 DIAGNOSIS — I10 Essential (primary) hypertension: Secondary | ICD-10-CM | POA: Diagnosis not present

## 2019-05-15 MED ORDER — BENAZEPRIL HCL 40 MG PO TABS: 40.0000 mg | ORAL_TABLET | Freq: Every day | ORAL | 3 refills | Status: DC

## 2019-05-15 NOTE — Patient Instructions (Signed)
Medication Instructions:  Your physician has recommended you make the following change in your medication:   INCREASE: benazepril (lotensin) to 40 mg once a day  *If you need a refill on your cardiac medications before your next appointment, please call your pharmacy*   Lab Work: Your physician recommends that you return for lab work (BMET) on 06/05/19  If you have labs (blood work) drawn today and your tests are completely normal, you will receive your results only by: Marland Kitchen MyChart Message (if you have MyChart) OR . A paper copy in the mail If you have any lab test that is abnormal or we need to change your treatment, we will call you to review the results.   Testing/Procedures: None ordered   Follow-Up: Follow up with Ermalinda Barrios, PA on 06/05/19 at 8:45 AM  You have been referred to Medical Weight Management   Other Instructions  1. Your provider recommends that you maintain 150 minutes per week of moderate aerobic activity.  2. Your physician has requested that you regularly monitor and record your blood pressure readings at home. Please use the same machine at the same time of day to check your readings and record them to bring to your follow-up visit.   Low-Sodium Eating Plan Sodium, which is an element that makes up salt, helps you maintain a healthy balance of fluids in your body. Too much sodium can increase your blood pressure and cause fluid and waste to be held in your body. Your health care provider or dietitian may recommend following this plan if you have high blood pressure (hypertension), kidney disease, liver disease, or heart failure. Eating less sodium can help lower your blood pressure, reduce swelling, and protect your heart, liver, and kidneys. What are tips for following this plan? General guidelines  Most people on this plan should limit their sodium intake to 1,500-2,000 mg (milligrams) of sodium each day. Reading food labels   The Nutrition Facts  label lists the amount of sodium in one serving of the food. If you eat more than one serving, you must multiply the listed amount of sodium by the number of servings.  Choose foods with less than 140 mg of sodium per serving.  Avoid foods with 300 mg of sodium or more per serving. Shopping  Look for lower-sodium products, often labeled as "low-sodium" or "no salt added."  Always check the sodium content even if foods are labeled as "unsalted" or "no salt added".  Buy fresh foods. ? Avoid canned foods and premade or frozen meals. ? Avoid canned, cured, or processed meats  Buy breads that have less than 80 mg of sodium per slice. Cooking  Eat more home-cooked food and less restaurant, buffet, and fast food.  Avoid adding salt when cooking. Use salt-free seasonings or herbs instead of table salt or sea salt. Check with your health care provider or pharmacist before using salt substitutes.  Cook with plant-based oils, such as canola, sunflower, or olive oil. Meal planning  When eating at a restaurant, ask that your food be prepared with less salt or no salt, if possible.  Avoid foods that contain MSG (monosodium glutamate). MSG is sometimes added to [place] food, bouillon, and some canned foods. What foods are recommended? The items listed may not be a complete list. Talk with your dietitian about what dietary choices are best for you. Grains Low-sodium cereals, including oats, puffed wheat and rice, and shredded wheat. Low-sodium crackers. Unsalted rice. Unsalted pasta. Low-sodium bread. Whole-grain breads and  whole-grain pasta. Vegetables Fresh or frozen vegetables. "No salt added" canned vegetables. "No salt added" tomato sauce and paste. Low-sodium or reduced-sodium tomato and vegetable juice. Fruits Fresh, frozen, or canned fruit. Fruit juice. Meats and other protein foods Fresh or frozen (no salt added) meat, poultry, seafood, and fish. Low-sodium canned tuna and salmon.  Unsalted nuts. Dried peas, beans, and lentils without added salt. Unsalted canned beans. Eggs. Unsalted nut butters. Dairy Milk. Soy milk. Cheese that is naturally low in sodium, such as ricotta cheese, fresh mozzarella, or Swiss cheese Low-sodium or reduced-sodium cheese. Cream cheese. Yogurt. Fats and oils Unsalted butter. Unsalted margarine with no trans fat. Vegetable oils such as canola or olive oils. Seasonings and other foods Fresh and dried herbs and spices. Salt-free seasonings. Low-sodium mustard and ketchup. Sodium-free salad dressing. Sodium-free light mayonnaise. Fresh or refrigerated horseradish. Lemon juice. Vinegar. Homemade, reduced-sodium, or low-sodium soups. Unsalted popcorn and pretzels. Low-salt or salt-free chips. What foods are not recommended? The items listed may not be a complete list. Talk with your dietitian about what dietary choices are best for you. Grains Instant hot cereals. Bread stuffing, pancake, and biscuit mixes. Croutons. Seasoned rice or pasta mixes. Noodle soup cups. Boxed or frozen macaroni and cheese. Regular salted crackers. Self-rising flour. Vegetables Sauerkraut, pickled vegetables, and relishes. Olives. [place] fries. Onion rings. Regular canned vegetables (not low-sodium or reduced-sodium). Regular canned tomato sauce and paste (not low-sodium or reduced-sodium). Regular tomato and vegetable juice (not low-sodium or reduced-sodium). Frozen vegetables in sauces. Meats and other protein foods Meat or fish that is salted, canned, smoked, spiced, or pickled. Bacon, ham, sausage, hotdogs, corned beef, chipped beef, packaged lunch meats, salt pork, jerky, pickled herring, anchovies, regular canned tuna, sardines, salted nuts. Dairy Processed cheese and cheese spreads. Cheese curds. Blue cheese. Feta cheese. String cheese. Regular cottage cheese. Buttermilk. Canned milk. Fats and oils Salted butter. Regular margarine. Ghee. Bacon fat. Seasonings and other  foods Onion salt, garlic salt, seasoned salt, table salt, and sea salt. Canned and packaged gravies. Worcestershire sauce. Tartar sauce. Barbecue sauce. Teriyaki sauce. Soy sauce, including reduced-sodium. Steak sauce. Fish sauce. Oyster sauce. Cocktail sauce. Horseradish that you find on the shelf. Regular ketchup and mustard. Meat flavorings and tenderizers. Bouillon cubes. Hot sauce and Tabasco sauce. Premade or packaged marinades. Premade or packaged taco seasonings. Relishes. Regular salad dressings. Salsa. Potato and tortilla chips. Corn chips and puffs. Salted popcorn and pretzels. Canned or dried soups. Pizza. Frozen entrees and pot pies. Summary  Eating less sodium can help lower your blood pressure, reduce swelling, and protect your heart, liver, and kidneys.  Most people on this plan should limit their sodium intake to 1,500-2,000 mg (milligrams) of sodium each day.  Canned, boxed, and frozen foods are high in sodium. Restaurant foods, fast foods, and pizza are also very high in sodium. You also get sodium by adding salt to food.  Try to cook at home, eat more fresh fruits and vegetables, and eat less fast food, canned, processed, or prepared foods. This information is not intended to replace advice given to you by your health care provider. Make sure you discuss any questions you have with your health care provider. Document Revised: 01/06/2017 Document Reviewed: 01/18/2016 Elsevier Patient Education  2020 [employer].

## 2019-05-20 ENCOUNTER — Other Ambulatory Visit: Payer: Self-pay

## 2019-05-20 DIAGNOSIS — E785 Hyperlipidemia, unspecified: Secondary | ICD-10-CM

## 2019-05-20 MED ORDER — ATORVASTATIN CALCIUM 80 MG PO TABS: 80.0000 mg | ORAL_TABLET | Freq: Every day | ORAL | 3 refills | Status: DC

## 2019-05-23 ENCOUNTER — Encounter (INDEPENDENT_AMBULATORY_CARE_PROVIDER_SITE_OTHER): Payer: Self-pay | Admitting: Family Medicine

## 2019-05-23 ENCOUNTER — Ambulatory Visit (INDEPENDENT_AMBULATORY_CARE_PROVIDER_SITE_OTHER): Payer: Medicare Other | Admitting: Family Medicine

## 2019-05-23 ENCOUNTER — Other Ambulatory Visit: Payer: Self-pay

## 2019-05-23 VITALS — BP 163/92 | HR 77 | Temp 98.1°F | Ht 64.0 in | Wt 265.0 lb

## 2019-05-23 DIAGNOSIS — I10 Essential (primary) hypertension: Secondary | ICD-10-CM

## 2019-05-23 DIAGNOSIS — E1159 Type 2 diabetes mellitus with other circulatory complications: Secondary | ICD-10-CM | POA: Diagnosis not present

## 2019-05-23 DIAGNOSIS — E785 Hyperlipidemia, unspecified: Secondary | ICD-10-CM

## 2019-05-23 DIAGNOSIS — R5383 Other fatigue: Secondary | ICD-10-CM

## 2019-05-23 DIAGNOSIS — I152 Hypertension secondary to endocrine disorders: Secondary | ICD-10-CM

## 2019-05-23 DIAGNOSIS — Z1331 Encounter for screening for depression: Secondary | ICD-10-CM | POA: Diagnosis not present

## 2019-05-23 DIAGNOSIS — R0683 Snoring: Secondary | ICD-10-CM

## 2019-05-23 DIAGNOSIS — E119 Type 2 diabetes mellitus without complications: Secondary | ICD-10-CM | POA: Diagnosis not present

## 2019-05-23 DIAGNOSIS — E1169 Type 2 diabetes mellitus with other specified complication: Secondary | ICD-10-CM

## 2019-05-23 DIAGNOSIS — Z0289 Encounter for other administrative examinations: Secondary | ICD-10-CM

## 2019-05-23 DIAGNOSIS — R0602 Shortness of breath: Secondary | ICD-10-CM

## 2019-05-23 DIAGNOSIS — Z6841 Body Mass Index (BMI) 40.0 and over, adult: Secondary | ICD-10-CM

## 2019-05-23 NOTE — Progress Notes (Signed)
Dear Jacolyn Reedy, PA-C,   Thank you for referring TYSHAE STAIR to our clinic. The following note includes my evaluation and treatment recommendations.  Chief Complaint:   OBESITY OCEANA WALTHALL (MR# [id number]) is a 69 y.o. female who presents for evaluation and treatment of obesity and related comorbidities. Current BMI is Body mass index is 45.49 kg/m. Tavonna has been struggling with her weight for many years and has been unsuccessful in either losing weight, maintaining weight loss, or reaching her healthy weight goal.  Yola is currently in the action stage of change and ready to dedicate time achieving and maintaining a healthier weight. Hadleigh is interested in becoming our patient and working on intensive lifestyle modifications including (but not limited to) diet and exercise for weight loss.  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  She lives with her husband.  Drinks soda, juice, sweet tea.  She will eat for comfort.  She provided the following food recall today:  Breakfast:  Cereal (Frosted Flakes) or biscuit. Snack:  Something sweet or chips. Lunch:  Sandwich or burger if out. Snack:  Chips/sweet. Dinner:  Pasta/spaghetti/salad. Snack:  Something sweet.  Kenzley's habits were reviewed today and are as follows: Her family eats meals together, she thinks her family will eat healthier with her, her desired weight loss is 126 pounds, she started gaining weight in her early 82s, her heaviest weight ever was 271 pounds, she is frequently drinking liquids with calories and she struggles with emotional eating.  Depression Screen Doria's Food and Mood (modified PHQ-9) score was 18.  Depression screen PHQ 2/9 05/23/2019  Decreased Interest 2  Down, Depressed, Hopeless 3  PHQ - 2 Score 5  Altered sleeping 2  Tired, decreased energy 3  Change in appetite 3  Feeling  bad or failure about yourself  2  Trouble concentrating 3  Moving slowly or fidgety/restless 0  Suicidal thoughts 0  PHQ-9 Score 18  Difficult doing work/chores Somewhat difficult   Subjective:   1. Other fatigue Aliyana admits to daytime somnolence and reports waking up still tired. Patent has a history of symptoms of daytime fatigue, morning fatigue and snoring. Zilphia generally gets 8 hours of sleep per night, and states that she has generally restful sleep. Snoring is present. Apneic episodes are present. Epworth Sleepiness Score is 4.  2. SOB (shortness of breath) on exertion Lanora Manis notes increasing shortness of breath with exercising and seems to be worsening over time with weight gain. She notes getting out of breath sooner with activity than she used to. Dennice denies shortness of breath at rest or orthopnea.  3. Type 2 diabetes mellitus without complication, without long-term current use of insulin (HCC) Medications reviewed.  She has been taking Actos for 4-5 years.  CT abdomen in 2019 (obtained for RUQ, resulting in cholecystectomy:  Hepatobiliary: Small indeterminate density 9 mm lesion in the RIGHT hepatic lobe. No biliary duct dilatation. Large 3 cm gallstone within the gallbladder. The gallbladder is elongated but no clear pericholecystic fluid or gallbladder wall thickening. Common bile duct normal caliber.   Since Actos was chosen as treatment for DM, patient may have other evidence of fatty liver. Will request records from PCP.  Lab Results  Component Value Date   LDLCALC 107 (H) 12/26/2017   CREATININE 0.72 12/06/2017   4. Hypertension associated with diabetes (HCC) ASCAD status post PCI in 2005.  She  is taking Lotensin.  BP Readings from Last 3 Encounters:  05/23/19 (!) 163/92  05/15/19 (!) 150/94  11/29/17 (!) 144/94   5. Hyperlipidemia associated with type 2 diabetes mellitus (HCC) Kathrene has hyperlipidemia and has been trying to improve her  cholesterol levels with intensive lifestyle modification including a low saturated fat diet, exercise and weight loss. She denies any chest pain, claudication or myalgias.  She takes Lipitor 80 mg daily.  Lab Results  Component Value Date   ALT 9 12/26/2017   AST 14 12/26/2017   ALKPHOS 127 (H) 12/26/2017   BILITOT 1.0 12/26/2017   Lab Results  Component Value Date   CHOL 184 12/26/2017   HDL 58 12/26/2017   LDLCALC 107 (H) 12/26/2017   TRIG 94 12/26/2017   CHOLHDL 3.2 12/26/2017   6. Snoring, with apnea Situation Chance of Dozing or Sleeping  Sitting and reading 1 = slight chance of dozing or sleeping  Watching television 1 = slight chance of dozing or sleeping  Sitting inactive in a public place (theater or meeting) 0 = would never doze or sleep  Lying down in the afternoon when circumstances permit 1 = slight chance of dozing or sleeping  Sitting and talking to someone 0 = would never doze or sleep  Sitting quietly after lunch without alcohol 1 = slight chance of dozing or sleeping  In a car, while stopped for a few minutes in traffic 0 = would never doze or sleep  TOTAL 4   7. Depression screening Blannie was screened for depression as part of her new patient workup.  PHQ-9 is 18.  Assessment/Plan:   1. Other fatigue Reene does feel that her weight is causing her energy to be lower than it should be. Fatigue may be related to obesity, depression or many other causes. Labs will be ordered, and in the meanwhile, Mistey will focus on self care including making healthy food choices, increasing physical activity and focusing on stress reduction.  2. SOB (shortness of breath) on exertion Estephanie does feel that she gets out of breath more easily that she used to when she exercises. Lilinoe's shortness of breath appears to be obesity related and exercise induced. She has agreed to work on weight loss and gradually increase exercise to treat her exercise induced shortness  of breath. Will continue to monitor closely.  3. Type 2 diabetes mellitus without complication, without long-term current use of insulin (HCC) Good blood sugar control is important to decrease the likelihood of diabetic complications such as nephropathy, neuropathy, limb loss, blindness, coronary artery disease, and death. Intensive lifestyle modification including diet, exercise and weight loss are the first line of treatment for diabetes.   4. Hypertension associated with diabetes (HCC) Mayline is working on healthy weight loss and exercise to improve blood pressure control. We will watch for signs of hypotension as she continues her lifestyle modifications.  She is followed by Cardiology.  Last visit was 05/15/2019.  Note reviewed.  5. Hyperlipidemia associated with type 2 diabetes mellitus (HCC) Cardiovascular risk and specific lipid/LDL goals reviewed.  We discussed several lifestyle modifications today and Leaann will continue to work on diet, exercise and weight loss efforts. Orders and follow up as documented in patient record.   Counseling Intensive lifestyle modifications are the first line treatment for this issue. . Dietary changes: Increase soluble fiber. Decrease simple carbohydrates. . Exercise changes: Moderate to vigorous-intensity aerobic activity 150 minutes per week if tolerated. . Lipid-lowering medications: see documented in medical record.  6. Snoring Will refer to Sleep Medicine for evaluation.  Orders - Ambulatory referral to Neurology  7. Depression screening Dylin had a positive depression screening. Depression is commonly associated with obesity and often results in emotional eating behaviors. We will monitor this closely and work on CBT to help improve the non-hunger eating patterns. Referral to Psychology may be required if no improvement is seen as she continues in our clinic.  8. Class 3 severe obesity with serious comorbidity and body mass index (BMI)  of 45.0 to 49.9 in adult, unspecified obesity type [facility]) Laquita is currently in the action stage of change and her goal is to continue with weight loss efforts. I recommend Nichelle begin the structured treatment plan as follows:  She has agreed to the Category 1 Plan.  Exercise goals: No exercise has been prescribed at this time.   Behavioral modification strategies: increasing lean protein intake, decreasing simple carbohydrates, increasing vegetables and increasing water intake.  She was informed of the importance of frequent follow-up visits to maximize her success with intensive lifestyle modifications for her multiple health conditions. She was informed we would discuss her lab results at her next visit unless there is a critical issue that needs to be addressed sooner. Addalie agreed to keep her next visit at the agreed upon time to discuss these results.  Objective:   Blood pressure (!) 163/92, pulse 77, temperature 98.1 F (36.7 C), temperature source Oral, height 5\' 4"  (1.626 m), weight 265 lb (120.2 kg), SpO2 98 %. Body mass index is 45.49 kg/m.  Indirect Calorimeter completed today shows a VO2 of 202 and a REE of 1405.  Her calculated basal metabolic rate is thus her basal metabolic rate is worse than expected.  General: Cooperative, alert, well developed, in no acute distress. HEENT: Conjunctivae and lids unremarkable. Cardiovascular: Regular rhythm.  Lungs: Normal work of breathing. Neurologic: No focal deficits.   Lab Results  Component Value Date   CREATININE 0.72 12/06/2017   BUN 10 12/06/2017   NA 144 12/06/2017   K 4.0 12/06/2017   CL 103 12/06/2017   CO2 25 12/06/2017   Lab Results  Component Value Date   ALT 9 12/26/2017   AST 14 12/26/2017   ALKPHOS 127 (H) 12/26/2017   BILITOT 1.0 12/26/2017   Lab Results  Component Value Date   CHOL 184 12/26/2017   HDL 58 12/26/2017   LDLCALC 107 (H) 12/26/2017   TRIG 94 12/26/2017   CHOLHDL 3.2  12/26/2017   Lab Results  Component Value Date   WBC 8.7 06/25/2017   HGB 14.3 06/25/2017   HCT 44.0 06/25/2017   MCV 88.9 06/25/2017   PLT 255 06/25/2017   Obesity Behavioral Intervention:   Approximately 15 minutes were spent on the discussion below.  ASK: We discussed the diagnosis of obesity with 06/27/2017 today and Joeleen agreed to give Lanora Manis permission to discuss obesity behavioral modification therapy today.  ASSESS: Brendaliz has the diagnosis of obesity and her BMI today is 45.6. Thatiana is in the action stage of change.   ADVISE: Yanina was educated on the multiple health risks of obesity as well as the benefit of weight loss to improve her health. She was advised of the need for long term treatment and the importance of lifestyle modifications to improve her current health and to decrease her risk of future health problems.  AGREE: Multiple dietary modification options and treatment options were discussed and Mikayah agreed to follow the recommendations documented in the  above note.  ARRANGE: Alyria was educated on the importance of frequent visits to treat obesity as outlined per CMS and USPSTF guidelines and agreed to schedule her next follow up appointment today.  Attestation Statements:   This is the patient's first visit at [facility]. The patient's NEW PATIENT PACKET was reviewed at length. Included in the packet: current and past health history, medications, allergies, ROS, gynecologic history (women only), surgical history, family history, social history, weight history, weight loss surgery history (for those that have had weight loss surgery), nutritional evaluation, mood and food questionnaire, PHQ9, Epworth questionnaire, sleep habits questionnaire, patient life and health improvement goals questionnaire. These will all be scanned into the patient's chart under media.   During the visit, I independently reviewed the patient's EKG,  bioimpedance scale results, and indirect calorimeter results. I used this information to tailor a meal plan for the patient that will help her to lose weight and will improve her obesity-related conditions going forward. I performed a medically necessary appropriate examination and/or evaluation. I discussed the assessment and treatment plan with the patient. The patient was provided an opportunity to ask questions and all were answered. The patient agreed with the plan and demonstrated an understanding of the instructions. Labs were ordered at this visit and will be reviewed at the next visit unless more critical results need to be addressed immediately. Clinical information was updated and documented in the EMR.   Time spent on visit including pre-visit chart review and post-visit care was 60 minutes.   I, Water quality scientist, CMA, am acting as Location manager for [employer], DO.  I have reviewed the above documentation for accuracy and completeness, and I agree with the above. Briscoe Deutscher, DO

## 2019-06-04 NOTE — Progress Notes (Signed)
Cardiology Office Note    Date:  06/05/2019   ID:  Angela Morrison, DOB 02-23-1950, MRN [id number]  PCP:  Cari Caraway, MD  Cardiologist: Fransico Him, MD EPS: None  Chief Complaint  Patient presents with  . Follow-up    History of Present Illness:  Angela Morrison is a 69 y.o. female with a hx of HTN, ASCAD s/p PCI of the RCA in 2005 and dyslipidemia.    Last office visit with Dr. Radford Pax 11/29/2017 she was having swelling on Lotrel so she stopped it and increased her benazepril to 20 mg daily.  Was not tolerating Zetia in addition to Pravachol so was referred to the lipid clinic.  PCSK9 inhibitors were recommended but have not been started.  I saw the patient 05/15/19 and BP elevated so I increased his lotensin 40 mg once daily. Was put on atorvastatin by PCP. I referred her to weight loss clinic.  Patient comes in for f/u. Trying to watch her salt but still getting some. BP still running high at home. No regular exercise. Vaccinated for [phone]    Past Medical History:  Diagnosis Date  . Anemia    few yrs ago none recent  . Arthritis   . Constipation   . Coronary artery disease 2005   S/P PCI of RCA  . Depression   . Diabetes mellitus without complication ([facility])    type 2  . Edema, lower extremity   . History of MI (myocardial infarction)   . Hyperlipidemia   . Hypertension   . Leg cramps   . Lower back pain   . Obesity   . PONV (postoperative nausea and vomiting)   . Shoulder pain   . Thyroid nodule    S/P bx 8/13 c/w goiter  . Vitamin D deficiency   . Weakness     Past Surgical History:  Procedure Laterality Date  . appendectomy Right   . BACK SURGERY     lower back  . CHOLECYSTECTOMY N/A 07/02/2017   Procedure: LAPAROSCOPIC CHOLECYSTECTOMY WITH INTRAOPERATIVE CHOLANGIOGRAM;  Surgeon: Excell Seltzer, MD;  Location: [facility];  Service: General;  Laterality: N/A;  . COLONOSCOPY WITH PROPOFOL N/A 05/17/2016   Procedure: COLONOSCOPY WITH PROPOFOL;   Surgeon: Garlan Fair, MD;  Location: [facility] ENDOSCOPY;  Service: Endoscopy;  Laterality: N/A;    Current Medications: Current Meds  Medication Sig  . aspirin EC 81 MG tablet Take 81 mg by mouth at bedtime.  Marland Kitchen atorvastatin (LIPITOR) 80 MG tablet Take 1 tablet (80 mg total) by mouth daily.  . benazepril (LOTENSIN) 40 MG tablet Take 1 tablet (40 mg total) by mouth daily.  . Cholecalciferol (VITAMIN D3) 2000 UNITS capsule Take 2,000 Units by mouth daily.   . fluticasone (FLONASE) 50 MCG/ACT nasal spray Place 1 spray into both nostrils daily as needed for allergies or rhinitis.  . Pioglitazone HCl (ACTOS PO) Take 20 mg by mouth at bedtime.  . [DISCONTINUED] atorvastatin (LIPITOR) 40 MG tablet Take 40 mg by mouth daily.     Allergies:   Crestor [rosuvastatin] and Penicillins   Social History   Socioeconomic History  . Marital status: Married    Spouse name: Not on file  . Number of children: Not on file  . Years of education: Not on file  . Highest education level: Not on file  Occupational History  . Occupation: retired  Tobacco Use  . Smoking status: Never Smoker  . Smokeless tobacco: Never Used  Substance and Sexual Activity  .  Alcohol use: Yes    Comment: occ  . Drug use: No  . Sexual activity: Not on file  Other Topics Concern  . Not on file  Social History Narrative  . Not on file   Social Determinants of Health   Financial Resource Strain:   . Difficulty of Paying Living Expenses:   Food Insecurity:   . Worried About Programme researcher, broadcasting/film/video in the Last Year:   . Barista in the Last Year:   Transportation Needs:   . Freight forwarder (Medical):   Marland Kitchen Lack of Transportation (Non-Medical):   Physical Activity:   . Days of Exercise per Week:   . Minutes of Exercise per Session:   Stress:   . Feeling of Stress :   Social Connections:   . Frequency of Communication with Friends and Family:   . Frequency of Social Gatherings with Friends and Family:   .  Attends Religious Services:   . Active Member of Clubs or Organizations:   . Attends Banker Meetings:   Marland Kitchen Marital Status:      Family History:  The patient's family history includes Alcoholism in her father; Alzheimer's disease in her mother; Dementia in her father; Hypertension in her father and mother.   ROS:   Please see the history of present illness.    ROS All other systems reviewed and are negative.   PHYSICAL EXAM:   VS:  BP (!) 148/90   Pulse 74   Ht 5\' 4"  (1.626 m)   Wt 263 lb (119.3 kg)   SpO2 98%   BMI 45.14 kg/m   Physical Exam  GEN:  Obese, in no acute distress  Neck: no JVD, carotid bruits, or masses Cardiac:RRR; no murmurs, rubs, or gallops  Respiratory:  clear to auscultation bilaterally, normal work of breathing GI: soft, nontender, nondistended, + BS Ext: without cyanosis, clubbing, or edema, Good distal pulses bilaterally Neuro:  Alert and Oriented x 3 Psych: euthymic mood, full affect  Wt Readings from Last 3 Encounters:  06/05/19 263 lb (119.3 kg)  05/23/19 265 lb (120.2 kg)  05/15/19 271 lb 1.9 oz (123 kg)      Studies/Labs Reviewed:   EKG:  EKG is not ordered today.    Recent Labs: No results found for requested labs within last 8760 hours.   Lipid Panel    Component Value Date/Time   CHOL 184 12/26/2017 0748   TRIG 94 12/26/2017 0748   HDL 58 12/26/2017 0748   CHOLHDL 3.2 12/26/2017 0748   CHOLHDL 4 06/30/2014 0914   VLDL 16.8 06/30/2014 0914   LDLCALC 107 (H) 12/26/2017 0748    Additional studies/ records that were reviewed today include:  NST 05/30/2017  Nuclear stress EF: 64%.  Blood pressure demonstrated a hypertensive response to exercise.  There was no ST segment deviation noted during stress.  Defect 1: There is a medium defect of mild severity present in the mid anterior and apical anterior location.  This is a low risk study.  The left ventricular ejection fraction is normal (55-65%).   Low risk  stress nuclear study with probable shifting breast attenuation and no significant ischemia; EF 64 with normal wall motion.           ASSESSMENT:    1. Coronary artery disease involving native coronary artery of native heart without angina pectoris   2. Essential hypertension   3. Hyperlipidemia, unspecified hyperlipidemia type   4. Obesity, unspecified classification, unspecified  obesity type, unspecified whether serious comorbidity present      PLAN:  In order of problems listed above:  CAD status post PCI of the RCA in 2005, low risk NST 05/30/2017 on aspirin and statin-recently changed. No angina. Recommend 150 min regular exercise weekly   Essential hypertension BP high LOV and I increase lotensin 40 mg once daily.  Blood pressure still running high.  She has reduced sodium in her diet but still getting some.  We will add hydrochlorothiazide 25 mg once daily.  Encouraged regular exercise.   Hyperlipidemia LDL 135 03/2019 I asked her to increase lipitor to 80 mg daily but she hasn't done yet. Will do now.   Obesity-has gained 10 lbs over the past year. Referred to weight loss clinic-diet and exercise essential     Medication Adjustments/Labs and Tests Ordered: Current medicines are reviewed at length with the patient today.  Concerns regarding medicines are outlined above.  Medication changes, Labs and Tests ordered today are listed in the Patient Instructions below. Patient Instructions  Medication Instructions:  Your physician has recommended you make the following change in your medication:   1. START: hydrochlorothiazide 25 mg tablet: Take 1 tablet by mouth once a day  2. INCREASE: atorvastatin to 80 mg once a day  *If you need a refill on your cardiac medications before your next appointment, please call your pharmacy*   Lab Work: TODAY: BMET  If you have labs (blood work) drawn today and your tests are completely normal, you will receive your results only  by: Marland Kitchen MyChart Message (if you have MyChart) OR . A paper copy in the mail If you have any lab test that is abnormal or we need to change your treatment, we will call you to review the results.   Testing/Procedures: None ordered   Follow-Up: Follow up with Jacolyn Reedy, PA on in 4 weeks on 07/02/19 at 11:00 AM   Other Instructions      Signed, Jacolyn Reedy, PA-C  06/05/2019 9:01 AM    [facility] [address] [place], [place], [place]  [address] Phone: [phone]; Fax: [phone]

## 2019-06-05 ENCOUNTER — Other Ambulatory Visit: Payer: Self-pay

## 2019-06-05 ENCOUNTER — Ambulatory Visit (INDEPENDENT_AMBULATORY_CARE_PROVIDER_SITE_OTHER): Payer: Medicare Other | Admitting: Physician Assistant

## 2019-06-05 ENCOUNTER — Encounter: Payer: Self-pay | Admitting: Physician Assistant

## 2019-06-05 VITALS — BP 148/90 | HR 74 | Ht 64.0 in | Wt 263.0 lb

## 2019-06-05 DIAGNOSIS — E785 Hyperlipidemia, unspecified: Secondary | ICD-10-CM

## 2019-06-05 DIAGNOSIS — I2510 Atherosclerotic heart disease of native coronary artery without angina pectoris: Secondary | ICD-10-CM | POA: Diagnosis not present

## 2019-06-05 DIAGNOSIS — E669 Obesity, unspecified: Secondary | ICD-10-CM

## 2019-06-05 DIAGNOSIS — I10 Essential (primary) hypertension: Secondary | ICD-10-CM | POA: Diagnosis not present

## 2019-06-05 LAB — BASIC METABOLIC PANEL
BUN/Creatinine Ratio: 11 — ABNORMAL LOW (ref 12–28)
BUN: 8 mg/dL (ref 8–27)
CO2: 23 mmol/L (ref 20–29)
Calcium: 9.9 mg/dL (ref 8.7–10.3)
Chloride: 102 mmol/L (ref 96–106)
Creatinine, Ser: 0.75 mg/dL (ref 0.57–1.00)
GFR calc Af Amer: 95 mL/min/{1.73_m2} (ref >59)
GFR calc non Af Amer: 82 mL/min/{1.73_m2} (ref >59)
Glucose: 99 mg/dL (ref 65–99)
Potassium: 3.9 mmol/L (ref 3.5–5.2)
Sodium: 141 mmol/L (ref 134–144)

## 2019-06-05 MED ORDER — ATORVASTATIN CALCIUM 80 MG PO TABS: 80.0000 mg | ORAL_TABLET | Freq: Every day | ORAL | 3 refills | Status: DC

## 2019-06-05 MED ORDER — HYDROCHLOROTHIAZIDE 25 MG PO TABS: 25.0000 mg | ORAL_TABLET | Freq: Every day | ORAL | 3 refills | Status: DC

## 2019-06-05 NOTE — Patient Instructions (Addendum)
Medication Instructions:  Your physician has recommended you make the following change in your medication:   1. START: hydrochlorothiazide 25 mg tablet: Take 1 tablet by mouth once a day  2. INCREASE: atorvastatin to 80 mg once a day  *If you need a refill on your cardiac medications before your next appointment, please call your pharmacy*   Lab Work: TODAY: BMET  If you have labs (blood work) drawn today and your tests are completely normal, you will receive your results only by: Marland Kitchen MyChart Message (if you have MyChart) OR . A paper copy in the mail If you have any lab test that is abnormal or we need to change your treatment, we will call you to review the results.   Testing/Procedures: None ordered   Follow-Up: Follow up with Jacolyn Reedy, PA on in 4 weeks on 07/02/19 at 11:00 AM   Other Instructions

## 2019-06-06 ENCOUNTER — Encounter (INDEPENDENT_AMBULATORY_CARE_PROVIDER_SITE_OTHER): Payer: Self-pay | Admitting: Family Medicine

## 2019-06-06 ENCOUNTER — Ambulatory Visit (INDEPENDENT_AMBULATORY_CARE_PROVIDER_SITE_OTHER): Payer: Medicare Other | Admitting: Family Medicine

## 2019-06-06 ENCOUNTER — Other Ambulatory Visit: Payer: Self-pay

## 2019-06-06 VITALS — BP 138/69 | HR 75 | Temp 98.1°F | Ht 64.0 in | Wt 258.0 lb

## 2019-06-06 DIAGNOSIS — E1159 Type 2 diabetes mellitus with other circulatory complications: Secondary | ICD-10-CM

## 2019-06-06 DIAGNOSIS — R160 Hepatomegaly, not elsewhere classified: Secondary | ICD-10-CM

## 2019-06-06 DIAGNOSIS — E119 Type 2 diabetes mellitus without complications: Secondary | ICD-10-CM

## 2019-06-06 DIAGNOSIS — I10 Essential (primary) hypertension: Secondary | ICD-10-CM

## 2019-06-06 DIAGNOSIS — Z6841 Body Mass Index (BMI) 40.0 and over, adult: Secondary | ICD-10-CM

## 2019-06-06 DIAGNOSIS — I152 Hypertension secondary to endocrine disorders: Secondary | ICD-10-CM

## 2019-06-06 NOTE — Progress Notes (Signed)
Chief Complaint:   OBESITY Angela Morrison is here to discuss her progress with her obesity treatment plan along with follow-up of her obesity related diagnoses. Angela Morrison is on the Category 1 Plan and states she is following her eating plan approximately 100% of the time. Angela Morrison states she is exercising for 0 minutes 0 times per week.  Today's visit was #: 2 Starting weight: 265 lbs Starting date: 05/23/2019 Today's weight: 258 lbs Today's date: 06/06/2019 Total lbs lost to date: 7 lbs Total lbs lost since last in-office visit: 7 lbs  Interim History: Angela Morrison has not been doing any night eating.  She has had cravings but has not been hungry.  Angela Morrison provided the following food recall today: Breakfast:  Yogurt, fruit (banana), water Lunch:  Sandwich ([place], mayo, cheese), water, 2 graham crackers Dinner:  Fruit or baked chicken, broccoli, brown rice.  Subjective:   1. Hypertension associated with diabetes (Angela Morrison) Review: taking medications as instructed, no medication side effects noted, no chest pain on exertion, no dyspnea on exertion, no swelling of ankles.  Angela Morrison says she will be starting HCTZ tomorrow.    BP Readings from Last 3 Encounters:  06/06/19 138/69  06/05/19 (!) 148/90  05/23/19 (!) 163/92   2. Type 2 diabetes mellitus without complication, without long-term current use of insulin (HCC) Medications reviewed. Diabetic ROS: no polyuria or polydipsia, no chest pain, dyspnea or TIA's, no numbness, tingling or pain in extremities.  Need to get A1c from her PCP.  No results found for: HGBA1C Lab Results  Component Value Date   LDLCALC 107 (H) 12/26/2017   CREATININE 0.75 06/05/2019   3. Liver mass This was seen on previous CT.  Assessment/Plan:   1. Hypertension associated with diabetes (Angela Morrison) Will see how much she diureses on HCTZ before her next visit.  2. Type 2 diabetes mellitus without complication, without long-term current use of insulin  (HCC) Good blood sugar control is important to decrease the likelihood of diabetic complications such as nephropathy, neuropathy, limb loss, blindness, coronary artery disease, and death. Intensive lifestyle modification including diet, exercise and weight loss are the first line of treatment for diabetes.  Will request A1c from PCP.  3. Liver mass Will request records to follow-up.  4. Class 3 severe obesity with serious comorbidity and body mass index (BMI) of 40.0 to 44.9 in adult, unspecified obesity type [name]) Angela Morrison is currently in the action stage of change. As such, her goal is to continue with weight loss efforts. She has agreed to the Category 1 Plan.   Exercise goals: No exercise has been prescribed at this time.  Behavioral modification strategies: increasing water intake.  Angela Morrison has agreed to follow-up with our clinic in 2 weeks. She was informed of the importance of frequent follow-up visits to maximize her success with intensive lifestyle modifications for her multiple health conditions.   Objective:   Blood pressure 138/69, pulse 75, temperature 98.1 F (36.7 C), temperature source Oral, height 5\' 4"  (1.626 m), weight 258 lb (117 kg), SpO2 97 %. Body mass index is 44.29 kg/m.  General: Cooperative, alert, well developed, in no acute distress. HEENT: Conjunctivae and lids unremarkable. Cardiovascular: Regular rhythm.  Lungs: Normal work of breathing. Neurologic: No focal deficits.   Lab Results  Component Value Date   CREATININE 0.75 06/05/2019   BUN 8 06/05/2019   NA 141 06/05/2019   K 3.9 06/05/2019   CL 102 06/05/2019   CO2 23 06/05/2019   Lab Results  Component Value Date   ALT 9 12/26/2017   AST 14 12/26/2017   ALKPHOS 127 (H) 12/26/2017   BILITOT 1.0 12/26/2017   Lab Results  Component Value Date   CHOL 184 12/26/2017   HDL 58 12/26/2017   LDLCALC 107 (H) 12/26/2017   TRIG 94 12/26/2017   CHOLHDL 3.2 12/26/2017   Lab Results  Component  Value Date   WBC 8.7 06/25/2017   HGB 14.3 06/25/2017   HCT 44.0 06/25/2017   MCV 88.9 06/25/2017   PLT 255 06/25/2017   Attestation Statements:   Reviewed by clinician on day of visit: allergies, medications, problem list, medical history, surgical history, family history, social history, and previous encounter notes.  Time spent on visit including pre-visit chart review and post-visit care and charting was 45 minutes.   I, Insurance claims handler, CMA, am acting as Energy manager for [employer], DO.  I have reviewed the above documentation for accuracy and completeness, and I agree with the above. Helane Rima, DO

## 2019-06-10 ENCOUNTER — Encounter: Payer: Self-pay | Admitting: Neurology

## 2019-06-10 ENCOUNTER — Ambulatory Visit (INDEPENDENT_AMBULATORY_CARE_PROVIDER_SITE_OTHER): Payer: Medicare Other | Admitting: Neurology

## 2019-06-10 ENCOUNTER — Other Ambulatory Visit: Payer: Self-pay

## 2019-06-10 VITALS — BP 172/89 | HR 73 | Temp 97.6°F | Ht 64.0 in | Wt 257.0 lb

## 2019-06-10 DIAGNOSIS — G4719 Other hypersomnia: Secondary | ICD-10-CM

## 2019-06-10 DIAGNOSIS — I10 Essential (primary) hypertension: Secondary | ICD-10-CM

## 2019-06-10 DIAGNOSIS — R0683 Snoring: Secondary | ICD-10-CM

## 2019-06-10 DIAGNOSIS — I2510 Atherosclerotic heart disease of native coronary artery without angina pectoris: Secondary | ICD-10-CM

## 2019-06-10 DIAGNOSIS — Z6841 Body Mass Index (BMI) 40.0 and over, adult: Secondary | ICD-10-CM | POA: Insufficient documentation

## 2019-06-10 NOTE — Progress Notes (Signed)
SLEEP MEDICINE CLINIC    Provider:  Melvyn Novas, MD  Primary Care Physician:  Gweneth Dimitri, MD [address] [place] [address]     Referring Provider: Dr Helane Rima, DO         Chief Complaint according to patient   Patient presents with:    . New Patient (Initial Visit)     pt presents today referred by healthy weight and wellness. states that she has had increase wt gain and they think she should check for OSA. + snoring, unknown if apnea a concern. never had a SS      HISTORY OF PRESENT ILLNESS:  Angela Morrison is a 69 year- old African American female patient and is seen here upon Dr Philis Pique referral on 06/10/2019 .  Chief concern according to patient :   I have the pleasure of seeing Angela Morrison today, a right-handed [place] or [place] American female with a possible sleep disorder.   She   has a past medical history of Anemia, Arthritis, Constipation, Coronary artery disease (2005), Depression, Diabetes mellitus without complication (HCC), Edema, lower extremity, History of MI (myocardial infarction), Hyperlipidemia, Hypertension, Leg cramps, Lower back pain, Obesity, PONV (postoperative nausea and vomiting), Shoulder pain, Thyroid nodule, Vitamin D deficiency, and Weakness..     Sleep relevant medical history:  Sleep walking as a teenager, she underwent Tonsillectomy.   Family medical /sleep history:NO other family member on CPAP with OSA, insomnia, sleep walkers.    Social history: Patient is retired since 2015  from  [employer]. No night shift history.  She lives in a household with spouse, the couple has 2 sons,and one daughter. All  adult children .  She has 6 grandchildren.  Pets are not present. She is allergic to pollen.  Tobacco use none .  ETOH use ; seldomly  Caffeine intake in form of Coffee( none ) Soda( prior to weight and wellness- ) Tea ( iced/ summer time  ) no energy drinks. Regular exercise - lapsed after  retirement      Sleep habits are as follows: The patient's dinner time is between 6-7  PM. The patient goes to bed at 9 PM and goes to bed and reads- she goes to sleep by 10 PM continues to sleep for many  hours, no bathroom breaks, her husband snoring wakes her. The bedroom is cool, not quiet - TV is on.     The preferred sleep position is laterally , with the support of 2 pillows. Dreams are reportedly rare. The patient wakes up spontaneously/at 6.30- by 7.30 AM is the usual rise time.  She reports not feeling refreshed or restored in AM, with residual fatigue.  Naps are taken infrequently, lasting from 30-60 minutes and are more refreshing than nocturnal sleep.  She averages less than 7 hours of sleep.    Review of Systems: Out of a complete 14 system review, the patient complains of only the following symptoms, and all other reviewed systems are negative.:  Fatigue, sleepiness , snoring, fragmented sleep, Insomnia    How likely are you to doze in the following situations: 0 = not likely, 1 = slight chance, 2 = moderate chance, 3 = high chance   Sitting and Reading? Watching Television? Sitting inactive in a public place (theater or meeting)? As a passenger in a car for an hour without a break? Lying down in the afternoon when circumstances permit? Sitting and talking to someone? Sitting quietly  after lunch without alcohol? In a car, while stopped for a few minutes in traffic?   Total = 15/ 24 points   FSS endorsed at 57/ 63 points.   Social History   Socioeconomic History  . Marital status: Married    Spouse name: Not on file  . Number of children: Not on file  . Years of education: Not on file  . Highest education level: Not on file  Occupational History  . Occupation: retired  Tobacco Use  . Smoking status: Never Smoker  . Smokeless tobacco: Never Used  Substance and Sexual Activity  . Alcohol use: Yes    Comment: occ  . Drug use: No  . Sexual activity: Not on  file  Other Topics Concern  . Not on file  Social History Narrative  . Not on file   Social Determinants of Health   Financial Resource Strain:   . Difficulty of Paying Living Expenses:   Food Insecurity:   . Worried About Programme researcher, broadcasting/film/video in the Last Year:   . Barista in the Last Year:   Transportation Needs:   . Freight forwarder (Medical):   Marland Kitchen Lack of Transportation (Non-Medical):   Physical Activity:   . Days of Exercise per Week:   . Minutes of Exercise per Session:   Stress:   . Feeling of Stress :   Social Connections:   . Frequency of Communication with Friends and Family:   . Frequency of Social Gatherings with Friends and Family:   . Attends Religious Services:   . Active Member of Clubs or Organizations:   . Attends Banker Meetings:   Marland Kitchen Marital Status:     Family History  Problem Relation Age of Onset  . Alzheimer's disease Mother   . Hypertension Mother   . Dementia Father   . Hypertension Father   . Alcoholism Father     Past Medical History:  Diagnosis Date  . Anemia    few yrs ago none recent  . Arthritis   . Constipation   . Coronary artery disease 2005   S/P PCI of RCA  . Depression   . Diabetes mellitus without complication (HCC)    type 2  . Edema, lower extremity   . History of MI (myocardial infarction)   . Hyperlipidemia   . Hypertension   . Leg cramps   . Lower back pain   . Obesity   . PONV (postoperative nausea and vomiting)   . Shoulder pain   . Thyroid nodule    S/P bx 8/13 c/w goiter  . Vitamin D deficiency   . Weakness     Past Surgical History:  Procedure Laterality Date  . appendectomy Right   . BACK SURGERY     lower back  . CHOLECYSTECTOMY N/A 07/02/2017   Procedure: LAPAROSCOPIC CHOLECYSTECTOMY WITH INTRAOPERATIVE CHOLANGIOGRAM;  Surgeon: Glenna Fellows, MD;  Location: [facility] ORS;  Service: General;  Laterality: N/A;  . COLONOSCOPY WITH PROPOFOL N/A 05/17/2016   Procedure:  COLONOSCOPY WITH PROPOFOL;  Surgeon: Charolett Bumpers, MD;  Location: [facility] ENDOSCOPY;  Service: Endoscopy;  Laterality: N/A;     Current Outpatient Medications on File Prior to Visit  Medication Sig Dispense Refill  . aspirin EC 81 MG tablet Take 81 mg by mouth at bedtime.    Marland Kitchen atorvastatin (LIPITOR) 80 MG tablet Take 1 tablet (80 mg total) by mouth daily. 90 tablet 3  . benazepril (LOTENSIN) 40 MG tablet Take  1 tablet (40 mg total) by mouth daily. 90 tablet 3  . Cholecalciferol (VITAMIN D3) 2000 UNITS capsule Take 2,000 Units by mouth daily.     . fluticasone (FLONASE) 50 MCG/ACT nasal spray Place 1 spray into both nostrils daily as needed for allergies or rhinitis.    . hydrochlorothiazide (HYDRODIURIL) 25 MG tablet Take 1 tablet (25 mg total) by mouth daily. 90 tablet 3  . Pioglitazone HCl (ACTOS PO) Take 20 mg by mouth at bedtime.     No current facility-administered medications on file prior to visit.    Allergies  Allergen Reactions  . Crestor [Rosuvastatin]     constipation  . Penicillins Itching    Has patient had a PCN reaction causing immediate rash, facial/tongue/throat swelling, SOB or lightheadedness with hypotension: No Has patient had a PCN reaction causing severe rash involving mucus membranes or skin necrosis: No Has patient had a PCN reaction that required hospitalization No Has patient had a PCN reaction occurring within the last 10 years: No If all of the above answers are "NO", then may proceed with Cephalosporin use.    Physical exam:  Today's Vitals   06/10/19 0819  BP: (!) 172/89  Pulse: 73  Temp: 97.6 F (36.4 C)  Weight: 257 lb (116.6 kg)  Height: 5\' 4"  (1.626 m)   Body mass index is 44.11 kg/m.   Wt Readings from Last 3 Encounters:  06/10/19 257 lb (116.6 kg)  06/06/19 258 lb (117 kg)  06/05/19 263 lb (119.3 kg)     Ht Readings from Last 3 Encounters:  06/10/19 5\' 4"  (1.626 m)  06/06/19 5\' 4"  (1.626 m)  06/05/19 5\' 4"  (1.626 m)        General: The patient is awake, alert and appears not in acute distress. The patient is well groomed. Head: Normocephalic, atraumatic. Neck is supple. Mallampati -3 plus / 4 ,  neck circumference:*15.5  inches . Nasal airflow patent.  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  Dental status: dentures.  Cardiovascular:  Regular rate and cardiac rhythm by pulse,  without distended neck veins. Respiratory: Lungs are clear to auscultation.  Skin:  Without evidence of ankle edema, or rash. Trunk: The patient's posture is erect.   Neurologic exam : The patient is awake and alert, oriented to place and time.   Memory subjective described as intact.  Attention span & concentration ability appears normal.  Speech is fluent,  without  dysarthria, dysphonia or aphasia.  Mood and affect are appropriate.   Cranial nerves: no loss of smell or taste reported - has not contracted Covid , fully vaccinated.  Pupils are equal and briskly reactive to light. Funduscopic exam deferred.   Extraocular movements in vertical and horizontal planes were intact and without nystagmus. No Diplopia. Visual fields by finger perimetry are intact. Hearing was intact to soft voice and finger rubbing.  Facial sensation intact to fine touch.  Facial motor strength is symmetric and tongue and uvula move midline.  Neck ROM : rotation, tilt and flexion extension were normal for age and shoulder shrug was symmetrical.    Motor exam:  Symmetric bulk, tone and ROM.   There is a slightly increased tone without cog- wheeling, symmetric grip strength, but reportedly weaker than it used to be.  .  Sensory:  Fine touch  and vibration were  normal.  Proprioception tested in the upper extremities was normal. Coordination: reported  changes in penmanship-  Less fine-.  Rapid alternating movements in the fingers/hands were  of normal speed.  The Finger-to-nose maneuver was intact without evidence of ataxia, dysmetria or tremor.   Gait and station:  Patient could rise unassisted from a seated position, walked without assistive device.  Stance is of larger width/ base and the patient turned with 3 steps.  Toe and heel walk were deferred.  Deep tendon reflexes: in the  upper and lower extremities are symmetric and intact.  Babinski response was deferred.   After spending a total time of 45 minutes face to face and additional time for physical and neurologic examination, review of laboratory studies,  personal review of imaging studies, reports and results of other testing and review of referral information / records as far as provided in visit, I have established the following assessments:  Angela Morrison was referred with a working diagnosis of obesity related diabetes type 2 without current long-term use of insulin, shortness of breath on exertion and other fatigue.  1)   Patient with Obesity at BMI 44 kg/m2 and long history of weight gain,accelerated after menopause and after retirement.   2) sleep hygiene  Can improve, TV on in the bedroom.   3) OSA rsik factors are also high Morrison retrognathia and Mallampati.    My Plan is to proceed with:  1) HST or  PSG, screening for OSA.    I would like to thank Cari Caraway, MD and Cari Caraway, [place],  [place] [address] for allowing me to meet with and to take care of this pleasant patient.   In short, Angela Morrison is presenting with high risks for OSA ,excessive daytime sleepiness.  I plan to follow up either personally or through our NP within 2-3 month.    Electronically signed by: Larey Seat, MD 06/10/2019 9:02 AM  [facility] [place] certified by The [employer] of Sleep Medicine and Diplomate of the [employer] of Sleep Medicine. Board certified In Neurology through the [place], Fellow of the [employer] of Neurology. Medical Director of [employer].

## 2019-06-10 NOTE — Patient Instructions (Signed)

## 2019-06-25 NOTE — Progress Notes (Signed)
Cardiology Office Note    Date:  07/02/2019   ID:  Angela Morrison, DOB 1950-09-07, MRN [id number]  PCP:  Gweneth Dimitri, MD  Cardiologist: Armanda Magic, MD EPS: None  Chief Complaint  Patient presents with  . Follow-up    History of Present Illness:  Angela Morrison is a 69 y.o. female with a hx of HTN, ASCAD s/p PCI of the RCA in 2005 and dyslipidemia.    Last office visit with Dr. Mayford Knife 11/29/2017 she was having swelling on Lotrel so she stopped it and increased her benazepril to 20 mg daily.  Was not tolerating Zetia in addition to Pravachol so was referred to the lipid clinic.  PCSK9 inhibitors were recommended but have not been started.   I saw the patient 05/15/19 and BP elevated so I increased his lotensin 40 mg once daily. Was put on atorvastatin by PCP. I referred her to weight loss clinic.   I saw the patient 06/05/2019 and blood pressure was still running high.  HCTZ 25 mg once daily and asked her to increase her atorvastatin to 80 mg once daily which she had done.  Also referred her to the weight loss clinic.  Patient has lost 13 lbs and walking 30 min 2-3 times/week. BP better controlled. Denies chest pain, dyspnea, dizziness or presyncope. Feeling better overall.    Past Medical History:  Diagnosis Date  . Anemia    few yrs ago none recent  . Arthritis   . Constipation   . Coronary artery disease 2005   S/P PCI of RCA  . Depression   . Diabetes mellitus without complication (HCC)    type 2  . Edema, lower extremity   . History of MI (myocardial infarction)   . Hyperlipidemia   . Hypertension   . Leg cramps   . Lower back pain   . Obesity   . PONV (postoperative nausea and vomiting)   . Shoulder pain   . Thyroid nodule    S/P bx 8/13 c/w goiter  . Vitamin D deficiency   . Weakness     Past Surgical History:  Procedure Laterality Date  . appendectomy Right   . BACK SURGERY     lower back  . CHOLECYSTECTOMY N/A 07/02/2017   Procedure:  LAPAROSCOPIC CHOLECYSTECTOMY WITH INTRAOPERATIVE CHOLANGIOGRAM;  Surgeon: Glenna Fellows, MD;  Location: [facility];  Service: General;  Laterality: N/A;  . COLONOSCOPY WITH PROPOFOL N/A 05/17/2016   Procedure: COLONOSCOPY WITH PROPOFOL;  Surgeon: Charolett Bumpers, MD;  Location: [facility];  Service: Endoscopy;  Laterality: N/A;    Current Medications: Current Meds  Medication Sig  . aspirin EC 81 MG tablet Take 81 mg by mouth at bedtime.  Marland Kitchen atorvastatin (LIPITOR) 80 MG tablet Take 1 tablet (80 mg total) by mouth daily.  . benazepril (LOTENSIN) 40 MG tablet Take 1 tablet (40 mg total) by mouth daily.  . Cholecalciferol (VITAMIN D3) 2000 UNITS capsule Take 2,000 Units by mouth daily.   . fluticasone (FLONASE) 50 MCG/ACT nasal spray Place 1 spray into both nostrils daily as needed for allergies or rhinitis.  . hydrochlorothiazide (HYDRODIURIL) 25 MG tablet Take 1 tablet (25 mg total) by mouth daily.  . Pioglitazone HCl (ACTOS PO) Take 20 mg by mouth at bedtime.     Allergies:   Crestor [rosuvastatin] and Penicillins   Social History   Socioeconomic History  . Marital status: Married    Spouse name: Not on file  . Number of children:  Not on file  . Years of education: Not on file  . Highest education level: Not on file  Occupational History  . Occupation: retired  Tobacco Use  . Smoking status: Never Smoker  . Smokeless tobacco: Never Used  Substance and Sexual Activity  . Alcohol use: Yes    Comment: occ  . Drug use: No  . Sexual activity: Not on file  Other Topics Concern  . Not on file  Social History Narrative  . Not on file   Social Determinants of Health   Financial Resource Strain:   . Difficulty of Paying Living Expenses:   Food Insecurity:   . Worried About Programme researcher, broadcasting/film/video in the Last Year:   . Barista in the Last Year:   Transportation Needs:   . Freight forwarder (Medical):   Marland Kitchen Lack of Transportation (Non-Medical):   Physical Activity:     . Days of Exercise per Week:   . Minutes of Exercise per Session:   Stress:   . Feeling of Stress :   Social Connections:   . Frequency of Communication with Friends and Family:   . Frequency of Social Gatherings with Friends and Family:   . Attends Religious Services:   . Active Member of Clubs or Organizations:   . Attends Banker Meetings:   Marland Kitchen Marital Status:      Family History:  The patient's   family history includes Alcoholism in her father; Alzheimer's disease in her mother; Dementia in her father; Hypertension in her father and mother.   ROS:   Please see the history of present illness.    ROS All other systems reviewed and are negative.   PHYSICAL EXAM:   VS:  BP 116/72   Pulse (!) 105   Ht 5\' 4"  (1.626 m)   Wt 251 lb 12.8 oz (114.2 kg)   SpO2 93%   BMI 43.22 kg/m   Physical Exam  GEN: Obese, in no acute distress  Neck: no JVD, carotid bruits, or masses Cardiac:RRR; no murmurs, rubs, or gallops  Respiratory:  clear to auscultation bilaterally, normal work of breathing GI: soft, nontender, nondistended, + BS Ext: without cyanosis, clubbing, or edema, Good distal pulses bilaterally Neuro:  Alert and Oriented x 3  Psych: euthymic mood, full affect  Wt Readings from Last 3 Encounters:  07/02/19 251 lb 12.8 oz (114.2 kg)  06/26/19 252 lb (114.3 kg)  06/10/19 257 lb (116.6 kg)      Studies/Labs Reviewed:   EKG:  EKG is not ordered today.    Recent Labs: 06/05/2019: BUN 8; Creatinine, Ser 0.75; Potassium 3.9; Sodium 141   Lipid Panel    Component Value Date/Time   CHOL 184 12/26/2017 0748   TRIG 94 12/26/2017 0748   HDL 58 12/26/2017 0748   CHOLHDL 3.2 12/26/2017 0748   CHOLHDL 4 06/30/2014 0914   VLDL 16.8 06/30/2014 0914   LDLCALC 107 (H) 12/26/2017 0748    Additional studies/ records that were reviewed today include:    NST 05/30/2017  Nuclear stress EF: 64%.  Blood pressure demonstrated a hypertensive response to  exercise.  There was no ST segment deviation noted during stress.  Defect 1: There is a medium defect of mild severity present in the mid anterior and apical anterior location.  This is a low risk study.  The left ventricular ejection fraction is normal (55-65%).   Low risk stress nuclear study with probable shifting breast attenuation and no  significant ischemia; EF 64 with normal wall motion.         ASSESSMENT:    1. Coronary artery disease involving native coronary artery of native heart without angina pectoris   2. Essential hypertension   3. Hyperlipidemia, unspecified hyperlipidemia type   4. Morbid obesity ([place])      PLAN:  In order of problems listed above:   CAD status post PCI of the RCA in 2005, low risk NST 05/30/2017 on aspirin and statin-recently changed. No angina. Recommend 150 min regular exercise weekly   Essential hypertension  HCTZ 25 mg added last office visit and BP well controlled. Check bmet today   Hyperlipidemia LDL 135 03/2019.  I increased her Lipitor to 80 mg once daily   Obesity-has lost 13 lbs by going to the weight loss clinic    Medication Adjustments/Labs and Tests Ordered: Current medicines are reviewed at length with the patient today.  Concerns regarding medicines are outlined above.  Medication changes, Labs and Tests ordered today are listed in the Patient Instructions below. Patient Instructions  Medication Instructions:  Your physician recommends that you continue on your current medications as directed. Please refer to the Current Medication list given to you today.  *If you need a refill on your cardiac medications before your next appointment, please call your pharmacy*   Lab Work: TODAY: BMET  If you have labs (blood work) drawn today and your tests are completely normal, you will receive your results only by: Marland Kitchen MyChart Message (if you have MyChart) OR . A paper copy in the mail If you have any lab test that is abnormal  or we need to change your treatment, we will call you to review the results.   Testing/Procedures: None ordered   Follow-Up: At [facility], you and your health needs are our priority.  As part of our continuing mission to provide you with exceptional heart care, we have created designated Provider Care Teams.  These Care Teams include your primary Cardiologist (physician) and Advanced Practice Providers (APPs -  Physician Assistants and Nurse Practitioners) who all work together to provide you with the care you need, when you need it.  We recommend signing up for the patient portal called "MyChart".  Sign up information is provided on this After Visit Summary.  MyChart is used to connect with patients for Virtual Visits (Telemedicine).  Patients are able to view lab/test results, encounter notes, upcoming appointments, etc.  Non-urgent messages can be sent to your provider as well.   To learn more about what you can do with MyChart, go to [email or web address].    Your next appointment:   6 month(s)  The format for your next appointment:   In Person  Provider:   You may see Fransico Him, MD or one of the following Advanced Practice Providers on your designated Care Team:    Melina Copa, PA-C  Ermalinda Barrios, PA-C    Other Instructions None     Signed, Ermalinda Barrios, PA-C  07/02/2019 11:29 AM    [facility] [address], [place], [place]  [address] Phone: [phone]; Fax: [phone]

## 2019-06-26 ENCOUNTER — Other Ambulatory Visit: Payer: Self-pay

## 2019-06-26 ENCOUNTER — Encounter (INDEPENDENT_AMBULATORY_CARE_PROVIDER_SITE_OTHER): Payer: Self-pay | Admitting: Family Medicine

## 2019-06-26 ENCOUNTER — Ambulatory Visit (INDEPENDENT_AMBULATORY_CARE_PROVIDER_SITE_OTHER): Payer: Medicare Other | Admitting: Family Medicine

## 2019-06-26 VITALS — BP 140/83 | HR 78 | Temp 98.5°F | Ht 64.0 in | Wt 252.0 lb

## 2019-06-26 DIAGNOSIS — Z6841 Body Mass Index (BMI) 40.0 and over, adult: Secondary | ICD-10-CM

## 2019-06-26 DIAGNOSIS — I10 Essential (primary) hypertension: Secondary | ICD-10-CM | POA: Diagnosis not present

## 2019-06-26 DIAGNOSIS — E1159 Type 2 diabetes mellitus with other circulatory complications: Secondary | ICD-10-CM | POA: Diagnosis not present

## 2019-06-26 DIAGNOSIS — E119 Type 2 diabetes mellitus without complications: Secondary | ICD-10-CM

## 2019-06-26 NOTE — Progress Notes (Signed)
Chief Complaint:   OBESITY Angela Morrison is here to discuss her progress with her obesity treatment plan along with follow-up of her obesity related diagnoses. Angela Morrison is on the Category 1 Plan and states she is following her eating plan approximately 75% of the time. Angela Morrison states she is doing limited exercise for 15-20 minutes 2 times per week.  Today's visit was #: 3 Starting weight: 265 lbs Starting date: 05/23/2019 Today's weight: 252 lbs Today's date: 06/26/2019 Total lbs lost to date: 13 lbs Total lbs lost since last in-office visit: 6 lbs  Interim History: Angela Morrison says she is scheduled for a home sleep study next week.  She is down 13 pounds total.  She is taking HCTZ and says she has not noticed an increase in urination.  She went off plan on Mother's Day and on her birthday.  Subjective:   1. Type 2 diabetes mellitus without complication, without long-term current use of insulin (HCC) Medications reviewed. Diabetic ROS: no polyuria or polydipsia, no chest pain, dyspnea or TIA's, no numbness, tingling or pain in extremities.  Angela Morrison says she is happy with her current diabetes management.    Lab Results  Component Value Date   LDLCALC 107 (H) 12/26/2017   CREATININE 0.75 06/05/2019   2. Hypertension associated with diabetes (Angela Morrison) Review: taking medications as instructed, no medication side effects noted, no chest pain on exertion, no dyspnea on exertion, no swelling of ankles.   BP Readings from Last 3 Encounters:  06/26/19 140/83  06/10/19 (!) 172/89  06/06/19 138/69   Assessment/Plan:   1. Type 2 diabetes mellitus without complication, without long-term current use of insulin (HCC) Good blood sugar control is important to decrease the likelihood of diabetic complications such as nephropathy, neuropathy, limb loss, blindness, coronary artery disease, and death. Intensive lifestyle modification including diet, exercise and weight loss are the first line of  treatment for diabetes.  If weight loss stalls, I recommend changing to metformin or GLP-1 RA.  2. Hypertension associated with diabetes ([place]) Angela Morrison is working on healthy weight loss and exercise to improve blood pressure control. We will watch for signs of hypotension as she continues her lifestyle modifications.  3. Class 3 severe obesity with serious comorbidity and body mass index (BMI) of 40.0 to 44.9 in adult, unspecified obesity type Angela Morrison) Angela Morrison is currently in the action stage of change. As such, her goal is to continue with weight loss efforts. She has agreed to the Category 1 Plan.   Exercise goals: Increase to 3 times per week.  Behavioral modification strategies: increasing lean protein intake and increasing water intake.  Angela Morrison has agreed to follow-up with our clinic in 2 weeks. She was informed of the importance of frequent follow-up visits to maximize her success with intensive lifestyle modifications for her multiple health conditions.   Objective:   Blood pressure 140/83, pulse 78, temperature 98.5 F (36.9 C), temperature source Oral, height 5\' 4"  (1.626 m), weight 252 lb (114.3 kg), SpO2 98 %. Body mass index is 43.26 kg/m.  General: Cooperative, alert, well developed, in no acute distress. HEENT: Conjunctivae and lids unremarkable. Cardiovascular: Regular rhythm.  Lungs: Normal work of breathing. Neurologic: No focal deficits.   Lab Results  Component Value Date   CREATININE 0.75 06/05/2019   BUN 8 06/05/2019   NA 141 06/05/2019   K 3.9 06/05/2019   CL 102 06/05/2019   CO2 23 06/05/2019   Lab Results  Component Value Date   ALT 9 12/26/2017  AST 14 12/26/2017   ALKPHOS 127 (H) 12/26/2017   BILITOT 1.0 12/26/2017   Lab Results  Component Value Date   CHOL 184 12/26/2017   HDL 58 12/26/2017   LDLCALC 107 (H) 12/26/2017   TRIG 94 12/26/2017   CHOLHDL 3.2 12/26/2017   Lab Results  Component Value Date   WBC 8.7 06/25/2017   HGB 14.3  06/25/2017   HCT 44.0 06/25/2017   MCV 88.9 06/25/2017   PLT 255 06/25/2017   Obesity Behavioral Intervention:   Approximately 15 minutes were spent on the discussion below.  ASK: We discussed the diagnosis of obesity with Angela Morrison today and Angela Morrison agreed to give [place] permission to discuss obesity behavioral modification therapy today.  ASSESS: Angela Morrison has the diagnosis of obesity and her BMI today is 43.3. Angela Morrison is in the action stage of change.   ADVISE: Angela Morrison was educated on the multiple health risks of obesity as well as the benefit of weight loss to improve her health. She was advised of the need for long term treatment and the importance of lifestyle modifications to improve her current health and to decrease her risk of future health problems.  AGREE: Multiple dietary modification options and treatment options were discussed and Angela Morrison agreed to follow the recommendations documented in the above note.  ARRANGE: Angela Morrison was educated on the importance of frequent visits to treat obesity as outlined per CMS and USPSTF guidelines and agreed to schedule her next follow up appointment today.  Attestation Statements:   Reviewed by clinician on day of visit: allergies, medications, problem list, medical history, surgical history, family history, social history, and previous encounter notes.  I, Angela Morrison, CMA, am acting as Energy manager for [employer], DO.  I have reviewed the above documentation for accuracy and completeness, and I agree with the above. Helane Rima, DO

## 2019-07-01 ENCOUNTER — Other Ambulatory Visit: Payer: Self-pay

## 2019-07-01 ENCOUNTER — Ambulatory Visit (INDEPENDENT_AMBULATORY_CARE_PROVIDER_SITE_OTHER): Payer: Medicare Other | Admitting: Neurology

## 2019-07-01 DIAGNOSIS — G4733 Obstructive sleep apnea (adult) (pediatric): Secondary | ICD-10-CM

## 2019-07-01 DIAGNOSIS — G4719 Other hypersomnia: Secondary | ICD-10-CM

## 2019-07-01 DIAGNOSIS — I10 Essential (primary) hypertension: Secondary | ICD-10-CM

## 2019-07-01 DIAGNOSIS — G4730 Sleep apnea, unspecified: Secondary | ICD-10-CM

## 2019-07-01 DIAGNOSIS — I2510 Atherosclerotic heart disease of native coronary artery without angina pectoris: Secondary | ICD-10-CM

## 2019-07-01 DIAGNOSIS — R0683 Snoring: Secondary | ICD-10-CM

## 2019-07-02 ENCOUNTER — Other Ambulatory Visit: Payer: Self-pay

## 2019-07-02 ENCOUNTER — Encounter: Payer: Self-pay | Admitting: Physician Assistant

## 2019-07-02 ENCOUNTER — Ambulatory Visit (INDEPENDENT_AMBULATORY_CARE_PROVIDER_SITE_OTHER): Payer: Medicare Other | Admitting: Physician Assistant

## 2019-07-02 VITALS — BP 116/72 | HR 105 | Ht 64.0 in | Wt 251.8 lb

## 2019-07-02 DIAGNOSIS — E785 Hyperlipidemia, unspecified: Secondary | ICD-10-CM

## 2019-07-02 DIAGNOSIS — I2510 Atherosclerotic heart disease of native coronary artery without angina pectoris: Secondary | ICD-10-CM | POA: Diagnosis not present

## 2019-07-02 DIAGNOSIS — I10 Essential (primary) hypertension: Secondary | ICD-10-CM | POA: Diagnosis not present

## 2019-07-02 LAB — BASIC METABOLIC PANEL
BUN/Creatinine Ratio: 12 (ref 12–28)
BUN: 10 mg/dL (ref 8–27)
CO2: 26 mmol/L (ref 20–29)
Calcium: 9.9 mg/dL (ref 8.7–10.3)
Chloride: 98 mmol/L (ref 96–106)
Creatinine, Ser: 0.85 mg/dL (ref 0.57–1.00)
GFR calc Af Amer: 81 mL/min/{1.73_m2} (ref >59)
GFR calc non Af Amer: 71 mL/min/{1.73_m2} (ref >59)
Glucose: 100 mg/dL — ABNORMAL HIGH (ref 65–99)
Potassium: 3.4 mmol/L — ABNORMAL LOW (ref 3.5–5.2)
Sodium: 141 mmol/L (ref 134–144)

## 2019-07-02 NOTE — Patient Instructions (Signed)
Medication Instructions:  Your physician recommends that you continue on your current medications as directed. Please refer to the Current Medication list given to you today.  *If you need a refill on your cardiac medications before your next appointment, please call your pharmacy*   Lab Work: TODAY: BMET  If you have labs (blood work) drawn today and your tests are completely normal, you will receive your results only by: Marland Kitchen MyChart Message (if you have MyChart) OR . A paper copy in the mail If you have any lab test that is abnormal or we need to change your treatment, we will call you to review the results.   Testing/Procedures: None ordered   Follow-Up: At [facility], you and your health needs are our priority.  As part of our continuing mission to provide you with exceptional heart care, we have created designated Provider Care Teams.  These Care Teams include your primary Cardiologist (physician) and Advanced Practice Providers (APPs -  Physician Assistants and Nurse Practitioners) who all work together to provide you with the care you need, when you need it.  We recommend signing up for the patient portal called "MyChart".  Sign up information is provided on this After Visit Summary.  MyChart is used to connect with patients for Virtual Visits (Telemedicine).  Patients are able to view lab/test results, encounter notes, upcoming appointments, etc.  Non-urgent messages can be sent to your provider as well.   To learn more about what you can do with MyChart, go to [email or web address].    Your next appointment:   6 month(s)  The format for your next appointment:   In Person  Provider:   You may see Armanda Magic, MD or one of the following Advanced Practice Providers on your designated Care Team:    Ronie Spies, PA-C  Jacolyn Reedy, PA-C    Other Instructions None

## 2019-07-03 ENCOUNTER — Other Ambulatory Visit: Payer: Self-pay

## 2019-07-03 DIAGNOSIS — E876 Hypokalemia: Secondary | ICD-10-CM

## 2019-07-03 MED ORDER — POTASSIUM CHLORIDE ER 10 MEQ PO TBCR
10.0000 meq | EXTENDED_RELEASE_TABLET | Freq: Every day | ORAL | 3 refills | Status: DC
Start: 2019-07-03 — End: 2019-07-18

## 2019-07-18 ENCOUNTER — Encounter (INDEPENDENT_AMBULATORY_CARE_PROVIDER_SITE_OTHER): Payer: Self-pay | Admitting: Family Medicine

## 2019-07-18 ENCOUNTER — Ambulatory Visit (INDEPENDENT_AMBULATORY_CARE_PROVIDER_SITE_OTHER): Payer: Medicare Other | Admitting: Family Medicine

## 2019-07-18 ENCOUNTER — Other Ambulatory Visit: Payer: Self-pay

## 2019-07-18 VITALS — BP 148/83 | HR 79 | Temp 98.5°F | Ht 64.0 in | Wt 250.0 lb

## 2019-07-18 DIAGNOSIS — E876 Hypokalemia: Secondary | ICD-10-CM

## 2019-07-18 DIAGNOSIS — Z6841 Body Mass Index (BMI) 40.0 and over, adult: Secondary | ICD-10-CM

## 2019-07-18 DIAGNOSIS — E1165 Type 2 diabetes mellitus with hyperglycemia: Secondary | ICD-10-CM | POA: Diagnosis not present

## 2019-07-18 MED ORDER — POTASSIUM CHLORIDE ER 10 MEQ PO TBCR: 10.0000 meq | EXTENDED_RELEASE_TABLET | Freq: Every day | ORAL | 0 refills | Status: DC

## 2019-07-18 NOTE — Progress Notes (Signed)
Chief Complaint:   OBESITY Angela Morrison is here to discuss her progress with her obesity treatment plan along with follow-up of her obesity related diagnoses. Angela Morrison is on the Category 1 Plan and states she is following her eating plan approximately 50% of the time. Angela Morrison states she is walking for 15-20 minutes 3-4 times per week.  Today's visit was #: 4 Starting weight: 265 lbs Starting date: 05/23/2019 Today's weight: 250 lbs Today's date: 07/18/2019 Total lbs lost to date: 15 lbs Total lbs lost since last in-office visit: 2 lbs  Interim History: Angela Morrison has had an excellent experience thus far at the clinic.  She has followed the plan at least 50%.  She says she was sick last week, so she was drinking mostly water.  She says she is still feeling very congested at night and is lacking an appetite.  Denies hunger.  She would like to increase her physical activity.  Subjective:   1. Hypokalemia Angela Morrison is taking 10 mEq of potassium daily.  Last potassium was 3.4.  2. Type 2 diabetes mellitus with hyperglycemia, without long-term current use of insulin (HCC) Medications reviewed. Diabetic ROS: no polyuria or polydipsia, no chest pain, dyspnea or TIA's, no numbness, tingling or pain in extremities.  No feelings of hypoglycemia.  She is taking Actos.  Labs from PCP are pending.  Lab Results  Component Value Date   LDLCALC 107 (H) 12/26/2017   CREATININE 0.85 07/02/2019   Assessment/Plan:   1. Hypokalemia Will refill potassium today. - potassium chloride (KLOR-CON) 10 MEQ tablet; Take 1 tablet (10 mEq total) by mouth daily.  Dispense: 90 tablet; Refill: 0  2. Type 2 diabetes mellitus with hyperglycemia, without long-term current use of insulin (HCC) Good blood sugar control is important to decrease the likelihood of diabetic complications such as nephropathy, neuropathy, limb loss, blindness, coronary artery disease, and death. Intensive lifestyle modification including  diet, exercise and weight loss are the first line of treatment for diabetes.  Follow-up labs in 3 months.  3. Class 3 severe obesity with serious comorbidity and body mass index (BMI) of 40.0 to 44.9 in adult, unspecified obesity type [facility]) Angela Morrison is currently in the action stage of change. As such, her goal is to continue with weight loss efforts. She has agreed to the Category 1 Plan.   Exercise goals: As is.  She would like to add more physical activity.  Behavioral modification strategies: increasing lean protein intake, meal planning and cooking strategies, keeping healthy foods in the home and planning for success.  Angela Morrison has agreed to follow-up with our clinic in 2-3 weeks. She was informed of the importance of frequent follow-up visits to maximize her success with intensive lifestyle modifications for her multiple health conditions.   Objective:   Blood pressure (!) 148/83, pulse 79, temperature 98.5 F (36.9 C), temperature source Oral, height 5\' 4"  (1.626 m), weight 250 lb (113.4 kg), SpO2 97 %. Body mass index is 42.91 kg/m.  General: Cooperative, alert, well developed, in no acute distress. HEENT: Conjunctivae and lids unremarkable. Cardiovascular: Regular rhythm.  Lungs: Normal work of breathing. Neurologic: No focal deficits.   Lab Results  Component Value Date   CREATININE 0.85 07/02/2019   BUN 10 07/02/2019   NA 141 07/02/2019   K 3.4 (L) 07/02/2019   CL 98 07/02/2019   CO2 26 07/02/2019   Lab Results  Component Value Date   ALT 9 12/26/2017   AST 14 12/26/2017   ALKPHOS 127 (H)  12/26/2017   BILITOT 1.0 12/26/2017   Lab Results  Component Value Date   CHOL 184 12/26/2017   HDL 58 12/26/2017   LDLCALC 107 (H) 12/26/2017   TRIG 94 12/26/2017   CHOLHDL 3.2 12/26/2017   Lab Results  Component Value Date   WBC 8.7 06/25/2017   HGB 14.3 06/25/2017   HCT 44.0 06/25/2017   MCV 88.9 06/25/2017   PLT 255 06/25/2017   Obesity Behavioral Intervention  Documentation for Insurance:   Approximately 15 minutes were spent on the discussion below.  ASK: We discussed the diagnosis of obesity with Angela Morrison today and Angela Morrison agreed to give [place] permission to discuss obesity behavioral modification therapy today.  ASSESS: Angela Morrison has the diagnosis of obesity and her BMI today is 43.0. Angela Morrison is in the action stage of change.   ADVISE: Angela Morrison was educated on the multiple health risks of obesity as well as the benefit of weight loss to improve her health. She was advised of the need for long term treatment and the importance of lifestyle modifications to improve her current health and to decrease her risk of future health problems.  AGREE: Multiple dietary modification options and treatment options were discussed and Angela Morrison agreed to follow the recommendations documented in the above note.  ARRANGE: Angela Morrison was educated on the importance of frequent visits to treat obesity as outlined per CMS and USPSTF guidelines and agreed to schedule her next follow up appointment today.  Attestation Statements:   Reviewed by clinician on day of visit: allergies, medications, problem list, medical history, surgical history, family history, social history, and previous encounter notes.  I, Water quality scientist, CMA, am acting as transcriptionist for Coralie Common, MD.  I have reviewed the above documentation for accuracy and completeness, and I agree with the above. - Jinny Blossom, MD

## 2019-07-19 NOTE — Addendum Note (Signed)
Addended by: Melvyn Novas on: 07/19/2019 03:12 PM   Modules accepted: Orders

## 2019-07-19 NOTE — Procedures (Signed)
  Patient Information     First Name: Angela Last Name: Morrison Ratcliffe: [id number]  Birth Date: [birth year] Age: 69 Gender: Female  Referring Provider: Helane Rima, DO BMI: 44.0 (W=258 lb, H=5' 4'')  Neck Circ.:  15 '' Epworth:  15/24   Sleep Study Information    Study Date: 07/01/2019 S/H/A Version: [id number] / [id number] / 77  History:     06-10-2019 Quintin Alto was referred by weight loss clinic, Dr Earlene Plater. She is a right-handed [place] American female with a medical history of Anemia, Arthritis, Constipation, Coronary artery disease (2005), Depression, Diabetes mellitus II (HCC), Edema, lower extremity, History of MI (myocardial infarction), Hyperlipidemia, Hypertension, Leg cramps, Morbid Obesity, Thyroid nodule, Vitamin D deficiency.          Summary & Diagnosis:    Severe sleep apnea is indicated with an AHI of 59.4/h and the higher AHI is found in NREM sleep, indicating a central portion of apnea. There is a widely variable heart rate noted, bradycardia at 39 bpm. O2 DI= 54.1 desaturations per hour of sleep. Apnea and desaturation were worse in supine sleep position.   Recommendations:     An attended PAP titration is recommended, given the severity of sleep apnea and the presumed complex type of apnea ( NREM accentuated) .  High AHI with high 02 Desaturation index and resulting bradycardia are indications of physiological stress. A dental device or inspire procedure would not work for hypoxemia associated apnea type.    Interpreting Physician: Melvyn Novas, MD                Sleep Summary  Oxygen Saturation Statistics   Start Study Time: End Study Time: Total Recording Time:          10:38:14 PM 4:47:42 AM         6 h, 9 min  Total Sleep Time % REM of Sleep Time:  5 h, 30 min  13.3    Mean: 108 Minimum: 80 Maximum: 100  Mean of Desaturations Nadirs (%):   91  Oxygen Desaturation. %:   4-9 10-20 >20 Total  Events Number Total   185  8 95.9 4.1   0 0.0  193 100.0  Oxygen Saturation <90 <=88 <85 <80 <70  Duration (minutes): Sleep % 4.0 1.2  2.2 0.2  0.7 0.1 0.0 0.0 0.0 0.0     Respiratory Indices      Total Events REM NREM All Night  pRDI:  223  pAHI:  223 ODI:  193  pAHIc:  7  % CSR: 0.0 50.2 50.2 46.0 0.0 61.6 61.6 52.7 2.7 59.4 59.4 51.4 2.2       Pulse Rate Statistics during Sleep (BPM)      Mean: 74 Minimum: 39 Maximum: 104    Indices are calculated using technically valid sleep time of 3 h, 45 min. Central-Indices are calculated using technically valid sleep time of 3 h, 9 min. pRDI/pAHI are calculated using 02 desaturations ? 3%.  Body Position Statistics  Position Supine Prone Right Left Non-Supine  Sleep (min) 113.0 0.0 217.5 0.0 217.5  Sleep % 34.2 0.0 65.8 0.0 65.8  pRDI 74.5 N/A 52.2 N/A 52.2  pAHI 74.5 N/A 52.2 N/A 52.2  ODI 67.1 N/A 44.0 N/A 44.0     Snoring Statistics Snoring Level (dB) >40 >50 >60 >70 >80 >Threshold (45)  Sleep (min) 48.3 1.9 0.6 0.0 0.0 5.6  Sleep % 14.6 0.6 0.2 0.0 0.0 1.7    Mean: 40 dB

## 2019-07-19 NOTE — Progress Notes (Signed)
Summary & Diagnosis:   Severe sleep apnea is indicated with an AHI of 59.4/h and the  higher AHI is found in NREM sleep, indicating a central portion  of apnea. There is a widely variable heart rate noted,  bradycardia at 39 bpm.  O2 DI= 54.1 desaturations per hour of sleep. Apnea and  desaturation were worse in supine sleep position.   Recommendations:    An attended PAP titration is recommended, given the severity of  sleep apnea and the presumed complex type of apnea ( NREM  accentuated) .  High AHI with high 02 Desaturation index and resulting  bradycardia are indications of physiological stress. A dental  device or inspire procedure would not work for hypoxemia  associated apnea type.    Interpreting Physician: Melvyn Novas, MD

## 2019-07-22 ENCOUNTER — Telehealth: Payer: Self-pay

## 2019-07-22 NOTE — Telephone Encounter (Signed)
We will do cpap titration on this patient.You can DC the auto order.

## 2019-07-23 ENCOUNTER — Telehealth: Payer: Self-pay | Admitting: Neurology

## 2019-07-23 NOTE — Telephone Encounter (Signed)
-----   Message from Melvyn Novas, MD sent at 07/19/2019  3:12 PM EDT ----- Summary & Diagnosis:   Severe sleep apnea is indicated with an AHI of 59.4/h and the  higher AHI is found in NREM sleep, indicating a central portion  of apnea. There is a widely variable heart rate noted,  bradycardia at 39 bpm.  O2 DI= 54.1 desaturations per hour of sleep. Apnea and  desaturation were worse in supine sleep position.   Recommendations:    An attended PAP titration is recommended, given the severity of  sleep apnea and the presumed complex type of apnea ( NREM  accentuated) .  High AHI with high 02 Desaturation index and resulting  bradycardia are indications of physiological stress. A dental  device or inspire procedure would not work for hypoxemia  associated apnea type.    Interpreting Physician: Melvyn Novas, MD

## 2019-07-23 NOTE — Telephone Encounter (Signed)
Noted  

## 2019-07-23 NOTE — Telephone Encounter (Signed)
Called patient to discuss sleep study results. No answer at this time. LVM for the patient to call back.   

## 2019-07-24 ENCOUNTER — Other Ambulatory Visit: Payer: Self-pay

## 2019-07-24 ENCOUNTER — Other Ambulatory Visit: Payer: Medicare Other | Admitting: *Deleted

## 2019-07-24 DIAGNOSIS — E876 Hypokalemia: Secondary | ICD-10-CM

## 2019-07-24 LAB — BASIC METABOLIC PANEL
BUN/Creatinine Ratio: 13 (ref 12–28)
BUN: 8 mg/dL (ref 8–27)
CO2: 22 mmol/L (ref 20–29)
Calcium: 9.4 mg/dL (ref 8.7–10.3)
Chloride: 101 mmol/L (ref 96–106)
Creatinine, Ser: 0.64 mg/dL (ref 0.57–1.00)
GFR calc Af Amer: 105 mL/min/{1.73_m2} (ref >59)
GFR calc non Af Amer: 91 mL/min/{1.73_m2} (ref >59)
Glucose: 114 mg/dL — ABNORMAL HIGH (ref 65–99)
Potassium: 3.9 mmol/L (ref 3.5–5.2)
Sodium: 140 mmol/L (ref 134–144)

## 2019-07-30 ENCOUNTER — Telehealth: Payer: Self-pay

## 2019-07-30 NOTE — Telephone Encounter (Signed)
LVM for pt to call me back to schedule sleep study  

## 2019-08-06 ENCOUNTER — Telehealth: Payer: Self-pay

## 2019-08-06 NOTE — Telephone Encounter (Signed)
Called patient to schedule CPAP study. Explained results of initial HST on 07/01/2019. Pt is agreeable to come back into sleep lab to be titrated on a CPAP machine. Pt had no questions at this time but was encouraged to call back if she did.   Pt is scheduled for CPAP study on 08/19/2019 @ 8pm

## 2019-08-06 NOTE — Telephone Encounter (Signed)
Noted, thanks!

## 2019-08-08 ENCOUNTER — Encounter (INDEPENDENT_AMBULATORY_CARE_PROVIDER_SITE_OTHER): Payer: Self-pay | Admitting: Family Medicine

## 2019-08-08 ENCOUNTER — Ambulatory Visit (INDEPENDENT_AMBULATORY_CARE_PROVIDER_SITE_OTHER): Payer: Medicare Other | Admitting: Family Medicine

## 2019-08-08 ENCOUNTER — Other Ambulatory Visit: Payer: Self-pay

## 2019-08-08 VITALS — BP 150/83 | HR 89 | Temp 98.2°F | Ht 64.0 in | Wt 250.0 lb

## 2019-08-08 DIAGNOSIS — Z6841 Body Mass Index (BMI) 40.0 and over, adult: Secondary | ICD-10-CM

## 2019-08-08 DIAGNOSIS — E876 Hypokalemia: Secondary | ICD-10-CM | POA: Diagnosis not present

## 2019-08-08 DIAGNOSIS — I10 Essential (primary) hypertension: Secondary | ICD-10-CM

## 2019-08-14 NOTE — Progress Notes (Signed)
Chief Complaint:   OBESITY Angela Morrison is here to discuss her progress with her obesity treatment plan along with follow-up of her obesity related diagnoses. Angela Morrison is on the Category 1 Plan and states she is following her eating plan approximately 60% of the time. Angela Morrison states she is walking for 15-20 minutes 2 times per week.  Today's visit was #: 5 Starting weight: 265 lbs Starting date: 05/23/2019 Today's weight: 250 lbs Today's date: 08/08/2019 Total lbs lost to date: 15 Total lbs lost since last in-office visit: 0  Interim History: Angela Morrison is trying to get back to the meal plan as she is getting over an upper respiratory infection. She would like to get back on the Category 1 plan. She has a small cookout planned for July 4th. She has been taking Mucinex D for congestion.  Subjective:   1. Essential hypertension Angela Morrison's blood pressure is not controlled today. She denies chest pain, chest pressure, or headache. She is on hydrochlorothiazide and benazepril. She has been taking a decongestant.  2. Hypokalemia Angela Morrison's last potassium level was 3.9. She is on potassium supplementation.  Assessment/Plan:   1. Essential hypertension Angela Morrison is working on healthy weight loss and exercise to improve blood pressure control. We will watch for signs of hypotension as she continues her lifestyle modifications. We will follow up on her blood pressure at her next appointment.  2. Hypokalemia We will repeat labs in 2 months. Angela Morrison will follow up as directed.  3. Class 3 severe obesity with serious comorbidity and body mass index (BMI) of 40.0 to 44.9 in adult, unspecified obesity type Angela Morrison) Angela Morrison is currently in the action stage of change. As such, her goal is to continue with weight loss efforts. She has agreed to the Category 1 Plan.   Exercise goals: Angela Morrison is to start thinking about where to add activity and what to do.  Behavioral modification  strategies: increasing lean protein intake, increasing vegetables, meal planning and cooking strategies, keeping healthy foods in the home and celebration eating strategies.  Angela Morrison has agreed to follow-up with our clinic in 2 weeks. She was informed of the importance of frequent follow-up visits to maximize her success with intensive lifestyle modifications for her multiple health conditions.   Objective:   Blood pressure (!) 150/83, pulse 89, temperature 98.2 F (36.8 C), temperature source Oral, height 5\' 4"  (1.626 m), weight 250 lb (113.4 kg), SpO2 96 %. Body mass index is 42.91 kg/m.  General: Cooperative, alert, well developed, in no acute distress. HEENT: Conjunctivae and lids unremarkable. Cardiovascular: Regular rhythm.  Lungs: Normal work of breathing. Neurologic: No focal deficits.   Lab Results  Component Value Date   CREATININE 0.64 07/24/2019   BUN 8 07/24/2019   NA 140 07/24/2019   K 3.9 07/24/2019   CL 101 07/24/2019   CO2 22 07/24/2019   Lab Results  Component Value Date   ALT 9 12/26/2017   AST 14 12/26/2017   ALKPHOS 127 (H) 12/26/2017   BILITOT 1.0 12/26/2017   No results found for: HGBA1C No results found for: INSULIN No results found for: TSH Lab Results  Component Value Date   CHOL 184 12/26/2017   HDL 58 12/26/2017   LDLCALC 107 (H) 12/26/2017   TRIG 94 12/26/2017   CHOLHDL 3.2 12/26/2017   Lab Results  Component Value Date   WBC 8.7 06/25/2017   HGB 14.3 06/25/2017   HCT 44.0 06/25/2017   MCV 88.9 06/25/2017   PLT 255 06/25/2017  No results found for: IRON, TIBC, FERRITIN  Attestation Statements:   Reviewed by clinician on day of visit: allergies, medications, problem list, medical history, surgical history, family history, social history, and previous encounter notes.  Time spent on visit including pre-visit chart review and post-visit care and charting was 15 minutes.    I, Burt Knack, am acting as transcriptionist for  [name], MD.  I have reviewed the above documentation for accuracy and completeness, and I agree with the above. - Katherina Mires, MD

## 2019-08-20 ENCOUNTER — Other Ambulatory Visit: Payer: Self-pay

## 2019-08-20 MED ORDER — BENAZEPRIL HCL 40 MG PO TABS: 40.0000 mg | ORAL_TABLET | Freq: Every day | ORAL | 3 refills | Status: DC

## 2019-08-21 ENCOUNTER — Ambulatory Visit (INDEPENDENT_AMBULATORY_CARE_PROVIDER_SITE_OTHER): Payer: Medicare Other | Admitting: Family Medicine

## 2019-08-21 ENCOUNTER — Other Ambulatory Visit: Payer: Medicare Other | Admitting: *Deleted

## 2019-08-21 ENCOUNTER — Other Ambulatory Visit: Payer: Self-pay

## 2019-08-21 ENCOUNTER — Encounter (INDEPENDENT_AMBULATORY_CARE_PROVIDER_SITE_OTHER): Payer: Self-pay | Admitting: Family Medicine

## 2019-08-21 VITALS — BP 130/82 | HR 83 | Temp 98.1°F | Ht 64.0 in | Wt 247.0 lb

## 2019-08-21 DIAGNOSIS — E1159 Type 2 diabetes mellitus with other circulatory complications: Secondary | ICD-10-CM

## 2019-08-21 DIAGNOSIS — E1165 Type 2 diabetes mellitus with hyperglycemia: Secondary | ICD-10-CM | POA: Diagnosis not present

## 2019-08-21 DIAGNOSIS — Z6841 Body Mass Index (BMI) 40.0 and over, adult: Secondary | ICD-10-CM

## 2019-08-21 DIAGNOSIS — I10 Essential (primary) hypertension: Secondary | ICD-10-CM | POA: Diagnosis not present

## 2019-08-21 DIAGNOSIS — E785 Hyperlipidemia, unspecified: Secondary | ICD-10-CM

## 2019-08-21 LAB — HEPATIC FUNCTION PANEL
ALT: 15 IU/L (ref 0–32)
AST: 14 IU/L (ref 0–40)
Albumin: 3.8 g/dL (ref 3.8–4.8)
Alkaline Phosphatase: 176 IU/L — ABNORMAL HIGH (ref 48–121)
Bilirubin Total: 1.2 mg/dL (ref 0.0–1.2)
Bilirubin, Direct: 0.31 mg/dL (ref 0.00–0.40)
Total Protein: 7.3 g/dL (ref 6.0–8.5)

## 2019-08-21 LAB — LIPID PANEL
Chol/HDL Ratio: 3.4 ratio (ref 0.0–4.4)
Cholesterol, Total: 168 mg/dL (ref 100–199)
HDL: 50 mg/dL (ref >39)
LDL Chol Calc (NIH): 104 mg/dL — ABNORMAL HIGH (ref 0–99)
Triglycerides: 75 mg/dL (ref 0–149)
VLDL Cholesterol Cal: 14 mg/dL (ref 5–40)

## 2019-08-21 MED ORDER — BENAZEPRIL HCL 40 MG PO TABS: 80.0000 mg | ORAL_TABLET | Freq: Every day | ORAL | 0 refills | Status: DC

## 2019-08-22 ENCOUNTER — Ambulatory Visit (INDEPENDENT_AMBULATORY_CARE_PROVIDER_SITE_OTHER): Payer: Medicare Other | Admitting: Family Medicine

## 2019-08-27 NOTE — Progress Notes (Signed)
Chief Complaint:   OBESITY Dilynn is here to discuss her progress with her obesity treatment plan along with follow-up of her obesity related diagnoses. Sesilia is on the Category 1 Plan and states she is following her eating plan approximately 50% of the time. Lasundra states she is walking for 15-20 minutes 1-2 times per week.  Today's visit was #: 6 Starting weight: 265 lbs Starting date: 05/23/2019 Today's weight: 247 lbs Today's date: 08/21/2019 Total lbs lost to date: 18 Total lbs lost since last in-office visit: 3  Interim History: Meadow really enjoyed July 4th. She ate a good deal "wrong things". She ate ribs, hot dogs, hamburgers, potato salad, cake, and cupcakes. She voices it has been somewhat challenging to get back on track. She is doing cereal for breakfast, and she is not exercising as much as she would like. Lunch is meat or hamburger salad with water. Dinner is similar to lunch.  Subjective:   1. Hypertension associated with diabetes (HCC) Taylon's blood pressure is well controlled today. She denies chest pain, chest pressure, or headaches. She is on benazepril.  2. Type 2 diabetes mellitus with hyperglycemia, without long-term current use of insulin (HCC) Jaylynne denies feelings of hypoglycemia, and she is on Actos.  Assessment/Plan:   1. Hypertension associated with diabetes (HCC) Yanina is working on healthy weight loss and exercise to improve blood pressure control. We will watch for signs of hypotension as she continues her lifestyle modifications. We will refill benazepril for 1 month.  - benazepril (LOTENSIN) 40 MG tablet; Take 2 tablets (80 mg total) by mouth daily.  Dispense: 60 tablet; Refill: 0  2. Type 2 diabetes mellitus with hyperglycemia, without long-term current use of insulin (HCC) Good blood sugar control is important to decrease the likelihood of diabetic complications such as nephropathy, neuropathy, limb loss, blindness,  coronary artery disease, and death. Intensive lifestyle modification including diet, exercise and weight loss are the first line of treatment for diabetes. Guida will continue Actos, no refill needed.  3. Class 3 severe obesity with serious comorbidity and body mass index (BMI) of 40.0 to 44.9 in adult, unspecified obesity type [facility]) Yuriko is currently in the action stage of change. As such, her goal is to continue with weight loss efforts. She has agreed to the Category 1 Plan.   Exercise goals: As is.  Behavioral modification strategies: increasing lean protein intake, increasing vegetables and meal planning and cooking strategies.  Luceal has agreed to follow-up with our clinic in 2 to 3 weeks. She was informed of the importance of frequent follow-up visits to maximize her success with intensive lifestyle modifications for her multiple health conditions.   Objective:   Blood pressure 130/82, pulse 83, temperature 98.1 F (36.7 C), temperature source Oral, height 5\' 4"  (1.626 m), weight 247 lb (112 kg), SpO2 95 %. Body mass index is 42.4 kg/m.  General: Cooperative, alert, well developed, in no acute distress. HEENT: Conjunctivae and lids unremarkable. Cardiovascular: Regular rhythm.  Lungs: Normal work of breathing. Neurologic: No focal deficits.   Lab Results  Component Value Date   CREATININE 0.64 07/24/2019   BUN 8 07/24/2019   NA 140 07/24/2019   K 3.9 07/24/2019   CL 101 07/24/2019   CO2 22 07/24/2019   Lab Results  Component Value Date   ALT 15 08/21/2019   AST 14 08/21/2019   ALKPHOS 176 (H) 08/21/2019   BILITOT 1.2 08/21/2019   No results found for: HGBA1C No results found  for: INSULIN No results found for: TSH Lab Results  Component Value Date   CHOL 168 08/21/2019   HDL 50 08/21/2019   LDLCALC 104 (H) 08/21/2019   TRIG 75 08/21/2019   CHOLHDL 3.4 08/21/2019   Lab Results  Component Value Date   WBC 8.7 06/25/2017   HGB 14.3 06/25/2017    HCT 44.0 06/25/2017   MCV 88.9 06/25/2017   PLT 255 06/25/2017   No results found for: IRON, TIBC, FERRITIN  Obesity Behavioral Intervention Documentation for Insurance:   Approximately 15 minutes were spent on the discussion below.  ASK: We discussed the diagnosis of obesity with Lanora Manis today and Malgorzata agreed to give [place] permission to discuss obesity behavioral modification therapy today.  ASSESS: Alek has the diagnosis of obesity and her BMI today is 42.38. Vincentina is in the action stage of change.   ADVISE: Deb was educated on the multiple health risks of obesity as well as the benefit of weight loss to improve her health. She was advised of the need for long term treatment and the importance of lifestyle modifications to improve her current health and to decrease her risk of future health problems.  AGREE: Multiple dietary modification options and treatment options were discussed and Aarin agreed to follow the recommendations documented in the above note.  ARRANGE: Kenslei was educated on the importance of frequent visits to treat obesity as outlined per CMS and USPSTF guidelines and agreed to schedule her next follow up appointment today.  Attestation Statements:   Reviewed by clinician on day of visit: allergies, medications, problem list, medical history, surgical history, family history, social history, and previous encounter notes.   I, Burt Knack, am acting as transcriptionist for [name], MD.  I have reviewed the above documentation for accuracy and completeness, and I agree with the above. - Katherina Mires, MD

## 2019-09-12 ENCOUNTER — Ambulatory Visit (INDEPENDENT_AMBULATORY_CARE_PROVIDER_SITE_OTHER): Payer: Medicare Other | Admitting: Family Medicine

## 2019-09-12 ENCOUNTER — Other Ambulatory Visit: Payer: Self-pay

## 2019-09-12 ENCOUNTER — Encounter (INDEPENDENT_AMBULATORY_CARE_PROVIDER_SITE_OTHER): Payer: Self-pay | Admitting: Family Medicine

## 2019-09-12 VITALS — BP 146/84 | HR 77 | Temp 97.9°F | Ht 64.0 in | Wt 244.0 lb

## 2019-09-12 DIAGNOSIS — Z6841 Body Mass Index (BMI) 40.0 and over, adult: Secondary | ICD-10-CM

## 2019-09-12 DIAGNOSIS — I10 Essential (primary) hypertension: Secondary | ICD-10-CM

## 2019-09-12 DIAGNOSIS — R7401 Elevation of levels of liver transaminase levels: Secondary | ICD-10-CM | POA: Diagnosis not present

## 2019-09-12 NOTE — Progress Notes (Signed)
Chief Complaint:   OBESITY Angela Morrison is here to discuss her progress with her obesity treatment plan along with follow-up of her obesity related diagnoses. Angela Morrison is on the Category 1 Plan and states she is following her eating plan approximately 60% of the time. Angela Morrison states she is walking for 2 hours 3 times per week.   Today's visit was #: 7 Starting weight: 265 lbs Starting date: 05/23/2019 Today's weight: 244 lbs Today's date: 09/12/2019 Total lbs lost to date: 21 Total lbs lost since last in-office visit: 3  Interim History: Angela Morrison voices the last few weeks have felt slow. Energy wise she feels low mentally as well. Her great granddaughter is critically ill and in the ICU at [facility]. She does think this is why she has been less focused. She is skipping meals as she is stress non eater.  Subjective:   1. Transaminitis Angela Morrison's last alk phos was 176, AST 14, and ALT 15. She has not previous elevation of any LFTs.  2. Essential hypertension Angela Morrison's blood pressure is slightly elevated today. She denies chest pain, chest pressure, or headaches, and her blood pressure is labile.  Assessment/Plan:   1. Transaminitis We will repeat labs in 2 months, and Angela Morrison will follow as directed.  2. Essential hypertension Angela Morrison is working on healthy weight loss and exercise to improve blood pressure control. We will watch for signs of hypotension as she continues her lifestyle modifications. Angela Morrison will continue her current medications with no change, if her blood pressure is elevated at her next appointment then we will need t increase her medications.  3. Class 3 severe obesity with serious comorbidity and body mass index (BMI) of 40.0 to 44.9 in adult, unspecified obesity type [facility]) Angela Morrison is currently in the action stage of change. As such, her goal is to continue with weight loss efforts. She has agreed to the Category 1 Plan.   Exercise goals: As  is.  Behavioral modification strategies: increasing lean protein intake, meal planning and cooking strategies, keeping healthy foods in the home and planning for success.  Angela Morrison has agreed to follow-up with our clinic in 3 weeks. She was informed of the importance of frequent follow-up visits to maximize her success with intensive lifestyle modifications for her multiple health conditions.   Objective:   Blood pressure (!) 146/84, pulse 77, temperature 97.9 F (36.6 C), temperature source Oral, height _0  (1.626 m), weight 244 lb (110.7 kg), SpO2 100 %. Body mass index is 41.88 kg/m.  General: Cooperative, alert, well developed, in no acute distress. HEENT: Conjunctivae and lids unremarkable. Cardiovascular: Regular rhythm.  Lungs: Normal work of breathing. Neurologic: No focal deficits.   Lab Results  Component Value Date   CREATININE 0.64 07/24/2019   BUN 8 07/24/2019   NA 140 07/24/2019   K 3.9 07/24/2019   CL 101 07/24/2019   CO2 22 07/24/2019   Lab Results  Component Value Date   ALT 15 08/21/2019   AST 14 08/21/2019   ALKPHOS 176 (H) 08/21/2019   BILITOT 1.2 08/21/2019   No results found for: HGBA1C No results found for: INSULIN No results found for: TSH Lab Results  Component Value Date   CHOL 168 08/21/2019   HDL 50 08/21/2019   LDLCALC 104 (H) 08/21/2019   TRIG 75 08/21/2019   CHOLHDL 3.4 08/21/2019   Lab Results  Component Value Date   WBC 8.7 06/25/2017   HGB 14.3 06/25/2017   HCT 44.0 06/25/2017   MCV  88.9 06/25/2017   PLT 255 06/25/2017   No results found for: IRON, TIBC, FERRITIN  Attestation Statements:   Reviewed by clinician on day of visit: allergies, medications, problem list, medical history, surgical history, family history, social history, and previous encounter notes.  Time spent on visit including pre-visit chart review and post-visit care and charting was 12 minutes.    I, Trixie Dredge, am acting as transcriptionist for  Coralie Common, MD.  I have reviewed the above documentation for accuracy and completeness, and I agree with the above. - Jinny Blossom, MD

## 2019-10-03 ENCOUNTER — Ambulatory Visit (INDEPENDENT_AMBULATORY_CARE_PROVIDER_SITE_OTHER): Payer: Medicare Other | Admitting: Family Medicine

## 2019-10-07 ENCOUNTER — Ambulatory Visit (INDEPENDENT_AMBULATORY_CARE_PROVIDER_SITE_OTHER): Payer: Medicare Other | Admitting: Family Medicine

## 2019-10-07 ENCOUNTER — Other Ambulatory Visit: Payer: Self-pay

## 2019-10-07 ENCOUNTER — Encounter (INDEPENDENT_AMBULATORY_CARE_PROVIDER_SITE_OTHER): Payer: Self-pay | Admitting: Family Medicine

## 2019-10-07 VITALS — BP 112/76 | HR 73 | Temp 98.2°F | Ht 64.0 in | Wt 243.0 lb

## 2019-10-07 DIAGNOSIS — E1165 Type 2 diabetes mellitus with hyperglycemia: Secondary | ICD-10-CM | POA: Diagnosis not present

## 2019-10-07 DIAGNOSIS — I10 Essential (primary) hypertension: Secondary | ICD-10-CM | POA: Diagnosis not present

## 2019-10-07 DIAGNOSIS — Z6841 Body Mass Index (BMI) 40.0 and over, adult: Secondary | ICD-10-CM

## 2019-10-07 NOTE — Progress Notes (Signed)
Chief Complaint:   OBESITY Angela Morrison is here to discuss her progress with her obesity treatment plan along with follow-up of her obesity related diagnoses. Angela Morrison is on the Category 1 Plan and states she is following her eating plan approximately 60% of the time. Angela Morrison states she is walking for 30 minutes 2 times per week.  Today's visit was #: 8 Starting weight: 265 lbs Starting date: 05/23/2019 Today's weight: 243 lbs Today's date: 10/07/2019 Total lbs lost to date: 22 Total lbs lost since last in-office visit: 1  Interim History: Angela Morrison didn't feel well last week when she had to miss her appointment. Her great granddaughter came home from the hospital last week and she had complications with her NG feeding tube. She has been doing mostly liquids. She may be interested in alternating meal plans.  Subjective:   1. Essential hypertension Angela Morrison's blood pressure is controlled today. She denies chest pain, chest pressure, or headache. She is on benzapril, HCTZ, and statin.  2. Type 2 diabetes mellitus with hyperglycemia, without long-term current use of insulin (HCC) Angela Morrison denies feelings of hypoglycemia. She is on Actos and she denies side effects.  Assessment/Plan:   1. Essential hypertension Angela Morrison will continue her current medications, no change in dosage. She will continue working on healthy weight loss and exercise to improve blood pressure control. We will watch for signs of hypotension as she continues her lifestyle modifications.  2. Type 2 diabetes mellitus with hyperglycemia, without long-term current use of insulin (HCC) Good blood sugar control is important to decrease the likelihood of diabetic complications such as nephropathy, neuropathy, limb loss, blindness, coronary artery disease, and death. Intensive lifestyle modification including diet, exercise and weight loss are the first line of treatment for diabetes. We will repeat labs in early  November. Angela Morrison will follow the Category 1 or Pescatarian plan.  3. Class 3 severe obesity with serious comorbidity and body mass index (BMI) of 40.0 to 44.9 in adult, unspecified obesity type [facility]) Angela Morrison is currently in the action stage of change. As such, her goal is to continue with weight loss efforts. She has agreed to the Category 1 Plan or the Pescatarian Plan.   Exercise goals: As is.  Behavioral modification strategies: increasing lean protein intake, meal planning and cooking strategies, keeping healthy foods in the home and planning for success.  Angela Morrison has agreed to follow-up with our clinic in 2 to 3 weeks. She was informed of the importance of frequent follow-up visits to maximize her success with intensive lifestyle modifications for her multiple health conditions.   Objective:   Blood pressure 112/76, pulse 73, temperature 98.2 F (36.8 C), temperature source Oral, height 5\' 4"  (1.626 m), weight 243 lb (110.2 kg), SpO2 100 %. Body mass index is 41.71 kg/m.  General: Cooperative, alert, well developed, in no acute distress. HEENT: Conjunctivae and lids unremarkable. Cardiovascular: Regular rhythm.  Lungs: Normal work of breathing. Neurologic: No focal deficits.   Lab Results  Component Value Date   CREATININE 0.64 07/24/2019   BUN 8 07/24/2019   NA 140 07/24/2019   K 3.9 07/24/2019   CL 101 07/24/2019   CO2 22 07/24/2019   Lab Results  Component Value Date   ALT 15 08/21/2019   AST 14 08/21/2019   ALKPHOS 176 (H) 08/21/2019   BILITOT 1.2 08/21/2019   No results found for: HGBA1C No results found for: INSULIN No results found for: TSH Lab Results  Component Value Date   CHOL 168  08/21/2019   HDL 50 08/21/2019   LDLCALC 104 (H) 08/21/2019   TRIG 75 08/21/2019   CHOLHDL 3.4 08/21/2019   Lab Results  Component Value Date   WBC 8.7 06/25/2017   HGB 14.3 06/25/2017   HCT 44.0 06/25/2017   MCV 88.9 06/25/2017   PLT 255 06/25/2017   No  results found for: IRON, TIBC, FERRITIN  Attestation Statements:   Reviewed by clinician on day of visit: allergies, medications, problem list, medical history, surgical history, family history, social history, and previous encounter notes.  Time spent on visit including pre-visit chart review and post-visit care and charting was 15 minutes.    I, Burt Knack, am acting as transcriptionist for [name], MD.  I have reviewed the above documentation for accuracy and completeness, and I agree with the above. - Katherina Mires, MD

## 2019-10-22 ENCOUNTER — Ambulatory Visit: Payer: Medicare Other | Attending: Internal Medicine

## 2019-10-22 DIAGNOSIS — Z23 Encounter for immunization: Secondary | ICD-10-CM

## 2019-10-22 NOTE — Progress Notes (Signed)
° °  Covid-19 Vaccination Clinic  Name:  MAKIAH FOYE    MRN: [id number] DOB: [birth year]  10/22/2019  Ms. Fong was observed post Covid-19 immunization for 15 minutes without incident. She was provided with Vaccine Information Sheet and instruction to access the V-Safe system.   Ms. Mula was instructed to call 911 with any severe reactions post vaccine:  Difficulty breathing   Swelling of face and throat   A fast heartbeat   A bad rash all over body   Dizziness and weakness

## 2019-10-29 ENCOUNTER — Other Ambulatory Visit: Payer: Self-pay

## 2019-10-29 ENCOUNTER — Ambulatory Visit (INDEPENDENT_AMBULATORY_CARE_PROVIDER_SITE_OTHER): Payer: Medicare Other | Admitting: Family Medicine

## 2019-10-29 ENCOUNTER — Encounter (INDEPENDENT_AMBULATORY_CARE_PROVIDER_SITE_OTHER): Payer: Self-pay | Admitting: Family Medicine

## 2019-10-29 VITALS — BP 108/72 | HR 88 | Temp 98.1°F | Ht 64.0 in | Wt 242.0 lb

## 2019-10-29 DIAGNOSIS — Z6841 Body Mass Index (BMI) 40.0 and over, adult: Secondary | ICD-10-CM

## 2019-10-29 DIAGNOSIS — E1169 Type 2 diabetes mellitus with other specified complication: Secondary | ICD-10-CM | POA: Diagnosis not present

## 2019-10-29 DIAGNOSIS — E1165 Type 2 diabetes mellitus with hyperglycemia: Secondary | ICD-10-CM

## 2019-10-29 DIAGNOSIS — E1159 Type 2 diabetes mellitus with other circulatory complications: Secondary | ICD-10-CM

## 2019-10-29 DIAGNOSIS — E785 Hyperlipidemia, unspecified: Secondary | ICD-10-CM

## 2019-10-29 DIAGNOSIS — I10 Essential (primary) hypertension: Secondary | ICD-10-CM

## 2019-10-29 MED ORDER — BENAZEPRIL HCL 40 MG PO TABS: 40.0000 mg | ORAL_TABLET | Freq: Every day | ORAL | 0 refills | Status: DC

## 2019-10-30 NOTE — Progress Notes (Signed)
Chief Complaint:   OBESITY Angela Morrison is here to discuss her progress with her obesity treatment plan along with follow-up of her obesity related diagnoses. Angela Morrison is on the Category 1 Plan or the [employer] and states she is following her eating plan approximately 50% of the time. Angela Morrison states she is walking for 20 minutes 2-3 times per week.  Today's visit was #: 9 Starting weight: 265 lbs Starting date: 05/23/2019 Today's weight: 242 lbs Today's date: 10/29/2019 Total lbs lost to date: 23 Total lbs lost since last in-office visit: 1  Interim History: Angela Morrison has been slightly more active, walking 3 times per week. She voices 50% of the time she is on the plan but the other 50% she has either not eaten on the plan or not eaten. She has looked at the [employer]. She is craving for sweets. She voices the upcoming few weeks is a low time for her with her mother's birthday (she would be 59).  Subjective:   1. Hyperlipidemia associated with type 2 diabetes mellitus (HCC) Angela Morrison's last LDL was 104, HDL 50, and triglycerides 75. She is not on statin, and she denies myalgias. She had an elevated Alk Phos but AST and ALT were within normal limits.  2. Type 2 diabetes mellitus with hyperglycemia, without long-term current use of insulin (HCC) Angela Morrison denies feelings of hypoglycemia. She is feeling well but she hasn't checked her BGs.  3. Hypertension associated with diabetes ([place]) Angela Morrison is on benazepril 80 mg daily. Her blood pressure is well controlled and low (previous blood pressure was 101 systolic). She is also on hydrochlorothiazide.  Assessment/Plan:   1. Hyperlipidemia associated with type 2 diabetes mellitus ([place]) Cardiovascular risk and specific lipid/LDL goals reviewed. We discussed several lifestyle modifications today. Angela Morrison will continue to work on diet, exercise and weight loss efforts. Angela Morrison will continue statin, and we will repeat labs  in early 2022. Orders and follow up as documented in patient record.   Counseling Intensive lifestyle modifications are the first line treatment for this issue. . Dietary changes: Increase soluble fiber. Decrease simple carbohydrates. . Exercise changes: Moderate to vigorous-intensity aerobic activity 150 minutes per week if tolerated. . Lipid-lowering medications: see documented in medical record.  2. Type 2 diabetes mellitus with hyperglycemia, without long-term current use of insulin (HCC) Good blood sugar control is important to decrease the likelihood of diabetic complications such as nephropathy, neuropathy, limb loss, blindness, coronary artery disease, and death. Intensive lifestyle modification including diet, exercise and weight loss are the first line of treatment for diabetes. We will repeat labs in January 2022. Angela Morrison will continue to follow up as directed.  3. Hypertension associated with diabetes ([place]) Angela Morrison is working on healthy weight loss and exercise to improve blood pressure control. We will watch for signs of hypotension as she continues her lifestyle modifications.Angela Morrison agreed to decreased benazepril to 40 mg daily and we will refill for 2 months. We will follow up on her blood pressure at her next appointment.  - benazepril (LOTENSIN) 40 MG tablet; Take 1 tablet (40 mg total) by mouth daily.  Dispense: 60 tablet; Refill: 0  4. Class 3 severe obesity with serious comorbidity and body mass index (BMI) of 50.0 to 59.9 in adult, unspecified obesity type Angela Morrison) Angela Morrison is currently in the action stage of change. As such, her goal is to continue with weight loss efforts. She has agreed to the Category 1 Plan.   Exercise goals: All adults should avoid inactivity.  Some physical activity is better than none, and adults who participate in any amount of physical activity gain some health benefits.  Behavioral modification strategies: increasing lean protein intake, meal  planning and cooking strategies, keeping healthy foods in the home and emotional eating strategies.  Angela Morrison has agreed to follow-up with our clinic in 2 to 3 weeks. She was informed of the importance of frequent follow-up visits to maximize her success with intensive lifestyle modifications for her multiple health conditions.   Objective:   Blood pressure 108/72, pulse 88, temperature 98.1 F (36.7 C), temperature source Oral, height _0  (1.626 m), weight 242 lb (109.8 kg), SpO2 98 %. Body mass index is 41.54 kg/m.  General: Cooperative, alert, well developed, in no acute distress. HEENT: Conjunctivae and lids unremarkable. Cardiovascular: Regular rhythm.  Lungs: Normal work of breathing. Neurologic: No focal deficits.   Lab Results  Component Value Date   CREATININE 0.64 07/24/2019   BUN 8 07/24/2019   NA 140 07/24/2019   K 3.9 07/24/2019   CL 101 07/24/2019   CO2 22 07/24/2019   Lab Results  Component Value Date   ALT 15 08/21/2019   AST 14 08/21/2019   ALKPHOS 176 (H) 08/21/2019   BILITOT 1.2 08/21/2019   No results found for: HGBA1C No results found for: INSULIN No results found for: TSH Lab Results  Component Value Date   CHOL 168 08/21/2019   HDL 50 08/21/2019   LDLCALC 104 (H) 08/21/2019   TRIG 75 08/21/2019   CHOLHDL 3.4 08/21/2019   Lab Results  Component Value Date   WBC 8.7 06/25/2017   HGB 14.3 06/25/2017   HCT 44.0 06/25/2017   MCV 88.9 06/25/2017   PLT 255 06/25/2017   No results found for: IRON, TIBC, FERRITIN  Obesity Behavioral Intervention:   Approximately 15 minutes were spent on the discussion below.  ASK: We discussed the diagnosis of obesity with Angela Morrison today and Angela Morrison agreed to give [place] permission to discuss obesity behavioral modification therapy today.  ASSESS: Angela Morrison has the diagnosis of obesity and her BMI today is 41.52. Angela Morrison is in the action stage of change.   ADVISE: Angela Morrison was educated on the  multiple health risks of obesity as well as the benefit of weight loss to improve her health. She was advised of the need for long term treatment and the importance of lifestyle modifications to improve her current health and to decrease her risk of future health problems.  AGREE: Multiple dietary modification options and treatment options were discussed and Angela Morrison agreed to follow the recommendations documented in the above note.  ARRANGE: Bronwen was educated on the importance of frequent visits to treat obesity as outlined per CMS and USPSTF guidelines and agreed to schedule her next follow up appointment today.  Attestation Statements:   Reviewed by clinician on day of visit: allergies, medications, problem list, medical history, surgical history, family history, social history, and previous encounter notes.   I, Trixie Dredge, am acting as transcriptionist for Coralie Common, MD.  I have reviewed the above documentation for accuracy and completeness, and I agree with the above. - Jinny Blossom, MD

## 2019-11-07 ENCOUNTER — Other Ambulatory Visit: Payer: Self-pay

## 2019-11-07 ENCOUNTER — Telehealth: Payer: Self-pay | Admitting: Cardiology

## 2019-11-07 DIAGNOSIS — E876 Hypokalemia: Secondary | ICD-10-CM

## 2019-11-07 MED ORDER — POTASSIUM CHLORIDE ER 10 MEQ PO TBCR: 10.0000 meq | EXTENDED_RELEASE_TABLET | Freq: Every day | ORAL | 0 refills | Status: DC

## 2019-11-07 NOTE — Telephone Encounter (Signed)
Pt calling requesting a refill on potassium chloride 10 meq. Would Dr. Mayford Knife like to refill this medication? Please address

## 2019-11-13 ENCOUNTER — Other Ambulatory Visit: Payer: Self-pay

## 2019-11-13 ENCOUNTER — Encounter (INDEPENDENT_AMBULATORY_CARE_PROVIDER_SITE_OTHER): Payer: Self-pay | Admitting: Family Medicine

## 2019-11-13 ENCOUNTER — Ambulatory Visit (INDEPENDENT_AMBULATORY_CARE_PROVIDER_SITE_OTHER): Payer: Medicare Other | Admitting: Family Medicine

## 2019-11-13 VITALS — BP 122/82 | HR 79 | Temp 98.0°F | Ht 64.0 in | Wt 243.0 lb

## 2019-11-13 DIAGNOSIS — E1159 Type 2 diabetes mellitus with other circulatory complications: Secondary | ICD-10-CM

## 2019-11-13 DIAGNOSIS — E1165 Type 2 diabetes mellitus with hyperglycemia: Secondary | ICD-10-CM | POA: Diagnosis not present

## 2019-11-13 DIAGNOSIS — I152 Hypertension secondary to endocrine disorders: Secondary | ICD-10-CM

## 2019-11-13 DIAGNOSIS — Z6841 Body Mass Index (BMI) 40.0 and over, adult: Secondary | ICD-10-CM

## 2019-11-13 NOTE — Progress Notes (Signed)
Chief Complaint:   OBESITY Angela Morrison is here to discuss her progress with her obesity treatment plan along with follow-up of her obesity related diagnoses. Angela Morrison is on the Category 1 Plan and states she is following her eating plan approximately 50% of the time. Angela Morrison states she is walking for 30 minutes 3-4 times per week.  Today's visit was #: 10 Starting weight: 265 lbs Starting date: 05/23/2019 Today's weight: 243 lbs Today's date: 11/13/2019 Total lbs lost to date: 22 Total lbs lost since last in-office visit: 0  Interim History: Angela Morrison has increased her walking over the last 2 weeks. She has been doing the meal plan about 40-50%. The other 50-60% was eating off the meal plan. She is eating things like hotdogs or burgers. She will like to get more on the plan in the upcoming weeks.  Subjective:   1. Hypertension associated with diabetes (HCC) Angela Morrison's blood pressure is well controlled. She denies chest pain, chest pressure, or headache. She stopped benazepril and didn't half it.  2. Type 2 diabetes mellitus with hyperglycemia, without long-term current use of insulin (HCC) Angela Morrison denies hypoglycemia. She is on Actos, and does report fatigue. She is not checking her blood sugars since she has felt fine.  Assessment/Plan:   1. Hypertension associated with diabetes (HCC) Angela Morrison is working on healthy weight loss and exercise to improve blood pressure control. We will watch for signs of hypotension as she continues her lifestyle modifications. We will follow up on her blood pressure at her next appointment, controlled currently.  2. Type 2 diabetes mellitus with hyperglycemia, without long-term current use of insulin (HCC) Good blood sugar control is important to decrease the likelihood of diabetic complications such as nephropathy, neuropathy, limb loss, blindness, coronary artery disease, and death. Intensive lifestyle modification including diet, exercise and  weight loss are the first line of treatment for diabetes. We will follow up on Angela Morrison's blood sugars at her next appointment.  3. Class 3 severe obesity with serious comorbidity and body mass index (BMI) of 40.0 to 44.9 in adult, unspecified obesity type Angela Morrison) Angela Morrison is currently in the action stage of change. As such, her goal is to continue with weight loss efforts. She has agreed to the Category 1 Plan or the [employer] Plan.   Exercise goals: Angela Morrison is to look into adding resistance training for 5-10 minutes 2-3 times per week.  Behavioral modification strategies: increasing lean protein intake, meal planning and cooking strategies, keeping healthy foods in the home and planning for success.  Angela Morrison has agreed to follow-up with our clinic in 2 to 3 weeks. She was informed of the importance of frequent follow-up visits to maximize her success with intensive lifestyle modifications for her multiple health conditions.   Objective:   Blood pressure 122/82, pulse 79, temperature 98 F (36.7 C), temperature source Oral, height 5\' 4"  (1.626 m), weight 243 lb (110.2 kg), SpO2 98 %. Body mass index is 41.71 kg/m.  General: Cooperative, alert, well developed, in no acute distress. HEENT: Conjunctivae and lids unremarkable. Cardiovascular: Regular rhythm.  Lungs: Normal work of breathing. Neurologic: No focal deficits.   Lab Results  Component Value Date   CREATININE 0.64 07/24/2019   BUN 8 07/24/2019   NA 140 07/24/2019   K 3.9 07/24/2019   CL 101 07/24/2019   CO2 22 07/24/2019   Lab Results  Component Value Date   ALT 15 08/21/2019   AST 14 08/21/2019   ALKPHOS 176 (H) 08/21/2019  BILITOT 1.2 08/21/2019   No results found for: HGBA1C No results found for: INSULIN No results found for: TSH Lab Results  Component Value Date   CHOL 168 08/21/2019   HDL 50 08/21/2019   LDLCALC 104 (H) 08/21/2019   TRIG 75 08/21/2019   CHOLHDL 3.4 08/21/2019   Lab Results   Component Value Date   WBC 8.7 06/25/2017   HGB 14.3 06/25/2017   HCT 44.0 06/25/2017   MCV 88.9 06/25/2017   PLT 255 06/25/2017   No results found for: IRON, TIBC, FERRITIN  Attestation Statements:   Reviewed by clinician on day of visit: allergies, medications, problem list, medical history, surgical history, family history, social history, and previous encounter notes.  Time spent on visit including pre-visit chart review and post-visit care and charting was 16 minutes.    I, Burt Knack, am acting as transcriptionist for [name], MD.  I have reviewed the above documentation for accuracy and completeness, and I agree with the above. - Katherina Mires, MD

## 2019-12-04 ENCOUNTER — Encounter (INDEPENDENT_AMBULATORY_CARE_PROVIDER_SITE_OTHER): Payer: Self-pay | Admitting: Family Medicine

## 2019-12-04 ENCOUNTER — Ambulatory Visit (INDEPENDENT_AMBULATORY_CARE_PROVIDER_SITE_OTHER): Payer: Medicare Other | Admitting: Family Medicine

## 2019-12-04 ENCOUNTER — Other Ambulatory Visit: Payer: Self-pay

## 2019-12-04 VITALS — BP 137/81 | HR 67 | Temp 98.2°F | Ht 64.0 in | Wt 244.0 lb

## 2019-12-04 DIAGNOSIS — E785 Hyperlipidemia, unspecified: Secondary | ICD-10-CM

## 2019-12-04 DIAGNOSIS — Z6841 Body Mass Index (BMI) 40.0 and over, adult: Secondary | ICD-10-CM

## 2019-12-04 DIAGNOSIS — E1169 Type 2 diabetes mellitus with other specified complication: Secondary | ICD-10-CM

## 2019-12-04 DIAGNOSIS — E1159 Type 2 diabetes mellitus with other circulatory complications: Secondary | ICD-10-CM | POA: Diagnosis not present

## 2019-12-04 DIAGNOSIS — I152 Hypertension secondary to endocrine disorders: Secondary | ICD-10-CM

## 2019-12-05 NOTE — Progress Notes (Signed)
Chief Complaint:   OBESITY Angela Morrison is here to discuss her progress with her obesity treatment plan along with follow-up of her obesity related diagnoses. Angela Morrison is on the Category 1 Plan and the [employer] and states she is following her eating plan approximately 50% of the time. Angela Morrison states she is walking 30-45 minutes 3-4 times per week.  Today's visit was #: 11 Starting weight: 265 lbs Starting date: 05/23/2019 Today's weight: 244 lbs Today's date: 12/04/2019 Total lbs lost to date: 21 Total lbs lost since last in-office visit: 0  Interim History: Angela Morrison has been prepping for homecoming. Goal is to get back on the meal plan after this weekend. She has some friends and relatives coming in. She needs to go grocery shopping to replenish some food.  Subjective:   Hyperlipidemia associated with type 2 diabetes mellitus (HCC). Angela Morrison is on Lipitor. Last LDL 104 (goal is <70). She denies myalgias.   Lab Results  Component Value Date   CHOL 168 08/21/2019   HDL 50 08/21/2019   LDLCALC 104 (H) 08/21/2019   TRIG 75 08/21/2019   CHOLHDL 3.4 08/21/2019   Lab Results  Component Value Date   ALT 15 08/21/2019   AST 14 08/21/2019   ALKPHOS 176 (H) 08/21/2019   BILITOT 1.2 08/21/2019   The 10-year ASCVD risk score Angela Morrison., et al., 2013) is: 24.5%   Values used to calculate the score:     Age: 69 years     Sex: Female     Is Non-Hispanic African American: Yes     Diabetic: Yes     Tobacco smoker: No     Systolic Blood Pressure: 137 mmHg     Is BP treated: Yes     HDL Cholesterol: 50 mg/dL     Total Cholesterol: 168 mg/dL  Hypertension associated with diabetes (HCC). Blood pressure is controlled today. Angela Morrison is off medications now. She denies chest pain, chest pressure, or headache.  BP Readings from Last 3 Encounters:  12/04/19 137/81  11/13/19 122/82  10/29/19 108/72   Lab Results  Component Value Date   CREATININE 0.64  07/24/2019   CREATININE 0.85 07/02/2019   CREATININE 0.75 06/05/2019    Assessment/Plan:   Hyperlipidemia associated with type 2 diabetes mellitus (HCC). Cardiovascular risk and specific lipid/LDL goals reviewed.  We discussed several lifestyle modifications today and Angela Morrison will continue to work on diet, exercise and weight loss efforts. Orders and follow up as documented in patient record. She will continue Lipitor as directed. Repeat labs will be checked in January 2022.  Counseling Intensive lifestyle modifications are the first line treatment for this issue. . Dietary changes: Increase soluble fiber. Decrease simple carbohydrates. . Exercise changes: Moderate to vigorous-intensity aerobic activity 150 minutes per week if tolerated. . Lipid-lowering medications: see documented in medical record.  Hypertension associated with diabetes (HCC). Angela Morrison is working on healthy weight loss and exercise to improve blood pressure control. We will watch for signs of hypotension as she continues her lifestyle modifications. Will monitor blood pressure at her next appointment.  Class 3 severe obesity with serious comorbidity and body mass index (BMI) of 40.0 to 44.9 in adult, unspecified obesity type (HCC).  Angela Morrison is currently in the action stage of change. As such, her goal is to continue with weight loss efforts. She has agreed to the Category 1 Plan and the [employer] Plan.   Exercise goals: Angela Morrison is to likely start resistance training at the [facility].  Behavioral modification strategies: increasing lean protein intake, meal planning and cooking strategies, keeping healthy foods in the home and planning for success.  Angela Morrison has agreed to follow-up with our clinic in 2-3 weeks. She was informed of the importance of frequent follow-up visits to maximize her success with intensive lifestyle modifications for her multiple health conditions.   Objective:   Blood pressure 137/81, pulse  67, temperature 98.2 F (36.8 C), temperature source Oral, height 5\' 4"  (1.626 m), weight 244 lb (110.7 kg), SpO2 98 %. Body mass index is 41.88 kg/m.  General: Cooperative, alert, well developed, in no acute distress. HEENT: Conjunctivae and lids unremarkable. Cardiovascular: Regular rhythm.  Lungs: Normal work of breathing. Neurologic: No focal deficits.   Lab Results  Component Value Date   CREATININE 0.64 07/24/2019   BUN 8 07/24/2019   NA 140 07/24/2019   K 3.9 07/24/2019   CL 101 07/24/2019   CO2 22 07/24/2019   Lab Results  Component Value Date   ALT 15 08/21/2019   AST 14 08/21/2019   ALKPHOS 176 (H) 08/21/2019   BILITOT 1.2 08/21/2019   No results found for: HGBA1C No results found for: INSULIN No results found for: TSH Lab Results  Component Value Date   CHOL 168 08/21/2019   HDL 50 08/21/2019   LDLCALC 104 (H) 08/21/2019   TRIG 75 08/21/2019   CHOLHDL 3.4 08/21/2019   Lab Results  Component Value Date   WBC 8.7 06/25/2017   HGB 14.3 06/25/2017   HCT 44.0 06/25/2017   MCV 88.9 06/25/2017   PLT 255 06/25/2017   No results found for: IRON, TIBC, FERRITIN  Attestation Statements:   Reviewed by clinician on day of visit: allergies, medications, problem list, medical history, surgical history, family history, social history, and previous encounter notes.  Time spent on visit including pre-visit chart review and post-visit charting and care was 15 minutes.   I, 06/27/2017, am acting as transcriptionist for Marianna Payment, MD  I have reviewed the above documentation for accuracy and completeness, and I agree with the above. - Angela Likes, MD

## 2019-12-11 ENCOUNTER — Other Ambulatory Visit: Payer: Self-pay | Admitting: Family Medicine

## 2019-12-11 DIAGNOSIS — Z1231 Encounter for screening mammogram for malignant neoplasm of breast: Secondary | ICD-10-CM

## 2019-12-12 ENCOUNTER — Ambulatory Visit
Admission: RE | Admit: 2019-12-12 | Discharge: 2019-12-12 | Disposition: A | Discharge status: Home / Self Care | Payer: Medicare Other | Source: Ambulatory Visit | Attending: Family Medicine | Admitting: Family Medicine

## 2019-12-12 ENCOUNTER — Other Ambulatory Visit: Payer: Self-pay

## 2019-12-12 DIAGNOSIS — Z1231 Encounter for screening mammogram for malignant neoplasm of breast: Secondary | ICD-10-CM

## 2019-12-23 ENCOUNTER — Other Ambulatory Visit: Payer: Self-pay

## 2019-12-23 ENCOUNTER — Ambulatory Visit (INDEPENDENT_AMBULATORY_CARE_PROVIDER_SITE_OTHER): Payer: Medicare Other | Admitting: Family Medicine

## 2019-12-23 ENCOUNTER — Encounter (INDEPENDENT_AMBULATORY_CARE_PROVIDER_SITE_OTHER): Payer: Self-pay | Admitting: Family Medicine

## 2019-12-23 VITALS — BP 147/83 | HR 70 | Temp 98.6°F | Ht 64.0 in | Wt 243.0 lb

## 2019-12-23 DIAGNOSIS — E1169 Type 2 diabetes mellitus with other specified complication: Secondary | ICD-10-CM

## 2019-12-23 DIAGNOSIS — Z6841 Body Mass Index (BMI) 40.0 and over, adult: Secondary | ICD-10-CM

## 2019-12-23 DIAGNOSIS — E785 Hyperlipidemia, unspecified: Secondary | ICD-10-CM | POA: Diagnosis not present

## 2019-12-23 DIAGNOSIS — E1165 Type 2 diabetes mellitus with hyperglycemia: Secondary | ICD-10-CM

## 2019-12-26 NOTE — Progress Notes (Signed)
Chief Complaint:   OBESITY Angela Morrison is here to discuss her progress with her obesity treatment plan along with follow-up of her obesity related diagnoses. Angela Morrison is on the Category 1 Plan or the [employer] and states she is following her eating plan approximately 45% of the time. Angela Morrison states she is walking for 15-20 minutes 3 times per week.  Today's visit was #: 12 Starting weight: 265 lbs Starting date: 05/23/2019 Today's weight: 243 lbs Today's date: 12/23/2019 Total lbs lost to date: 22 Total lbs lost since last in-office visit: 1  Interim History: Angela Morrison has had activities the last few weeks keeping her from adhering to the meal plan. She is using the few weeks to prepare for Thanksgiving and the holidays. Her husband cooks. No activities for upcoming weeks and she doesn't anticipate traveling.  Subjective:   1. Hyperlipidemia associated with type 2 diabetes mellitus (HCC) Angela Morrison is on Lipitor, and she denies myalgias or transaminitis.  2. Type 2 diabetes mellitus with hyperglycemia, without long-term current use of insulin (HCC) Angela Morrison is Actos, and she denies feelings of hypoglycemia.  Assessment/Plan:   1. Hyperlipidemia associated with type 2 diabetes mellitus (HCC) Cardiovascular risk and specific lipid/LDL goals reviewed.  We discussed several lifestyle modifications today and Angela Morrison will continue Lipitor, and will continue to work on diet, exercise and weight loss efforts. Orders and follow up as documented in patient record.   Counseling Intensive lifestyle modifications are the first line treatment for this issue. . Dietary changes: Increase soluble fiber. Decrease simple carbohydrates. . Exercise changes: Moderate to vigorous-intensity aerobic activity 150 minutes per week if tolerated. . Lipid-lowering medications: see documented in medical record.  2. Type 2 diabetes mellitus with hyperglycemia, without long-term current use of  insulin (HCC) Good blood sugar control is important to decrease the likelihood of diabetic complications such as nephropathy, neuropathy, limb loss, blindness, coronary artery disease, and death. Intensive lifestyle modification including diet, exercise and weight loss are the first line of treatment for diabetes. We will repeat labs in January 2022.  3. Class 3 severe obesity with serious comorbidity and body mass index (BMI) of 40.0 to 44.9 in adult, unspecified obesity type Angela Morrison) Angela Morrison is currently in the action stage of change. As such, her goal is to continue with weight loss efforts. She has agreed to the [employer].   Exercise goals: All adults should avoid inactivity. Some physical activity is better than none, and adults who participate in any amount of physical activity gain some health benefits.  Behavioral modification strategies: increasing lean protein intake, meal planning and cooking strategies, keeping healthy foods in the home and planning for success.  Angela Morrison has agreed to follow-up with our clinic in 3 to 4 weeks. She was informed of the importance of frequent follow-up visits to maximize her success with intensive lifestyle modifications for her multiple health conditions.   Objective:   Blood pressure (!) 147/83, pulse 70, temperature 98.6 F (37 C), temperature source Oral, height 5\' 4"  (1.626 m), weight 243 lb (110.2 kg), SpO2 97 %. Body mass index is 41.71 kg/m.  General: Cooperative, alert, well developed, in no acute distress. HEENT: Conjunctivae and lids unremarkable. Cardiovascular: Regular rhythm.  Lungs: Normal work of breathing. Neurologic: No focal deficits.   Lab Results  Component Value Date   CREATININE 0.64 07/24/2019   BUN 8 07/24/2019   NA 140 07/24/2019   K 3.9 07/24/2019   CL 101 07/24/2019   CO2 22 07/24/2019  Lab Results  Component Value Date   ALT 15 08/21/2019   AST 14 08/21/2019   ALKPHOS 176 (H) 08/21/2019   BILITOT  1.2 08/21/2019   No results found for: HGBA1C No results found for: INSULIN No results found for: TSH Lab Results  Component Value Date   CHOL 168 08/21/2019   HDL 50 08/21/2019   LDLCALC 104 (H) 08/21/2019   TRIG 75 08/21/2019   CHOLHDL 3.4 08/21/2019   Lab Results  Component Value Date   WBC 8.7 06/25/2017   HGB 14.3 06/25/2017   HCT 44.0 06/25/2017   MCV 88.9 06/25/2017   PLT 255 06/25/2017   No results found for: IRON, TIBC, FERRITIN  Attestation Statements:   Reviewed by clinician on day of visit: allergies, medications, problem list, medical history, surgical history, family history, social history, and previous encounter notes.  Time spent on visit including pre-visit chart review and post-visit care and charting was 16 minutes.    I, Burt Knack, am acting as transcriptionist for [name], MD.  I have reviewed the above documentation for accuracy and completeness, and I agree with the above. - Katherina Mires, MD

## 2020-01-27 ENCOUNTER — Other Ambulatory Visit: Payer: Self-pay

## 2020-01-27 ENCOUNTER — Encounter (INDEPENDENT_AMBULATORY_CARE_PROVIDER_SITE_OTHER): Payer: Self-pay | Admitting: Family Medicine

## 2020-01-27 ENCOUNTER — Ambulatory Visit (INDEPENDENT_AMBULATORY_CARE_PROVIDER_SITE_OTHER): Payer: Medicare Other | Admitting: Family Medicine

## 2020-01-27 VITALS — BP 148/88 | HR 81 | Temp 98.3°F | Ht 64.0 in | Wt 244.0 lb

## 2020-01-27 DIAGNOSIS — I10 Essential (primary) hypertension: Secondary | ICD-10-CM

## 2020-01-27 DIAGNOSIS — Z6841 Body Mass Index (BMI) 40.0 and over, adult: Secondary | ICD-10-CM

## 2020-01-27 DIAGNOSIS — E1165 Type 2 diabetes mellitus with hyperglycemia: Secondary | ICD-10-CM

## 2020-01-27 NOTE — Progress Notes (Signed)
Chief Complaint:   OBESITY Angela Morrison is here to discuss her progress with her obesity treatment plan along with follow-up of her obesity related diagnoses. Angela Morrison is on the [employer] and states she is following her eating plan approximately 45-50% of the time. Angela Morrison states she is walking for 15 minutes 3 times per week.  Today's visit was #: 13 Starting weight: 265 lbs Starting date: 05/23/2019 Today's weight: 244 lbs Today's date: 01/27/2020 Total lbs lost to date: 21 Total lbs lost since last in-office visit: 0  Interim History: Angela Morrison stayed home for Thanksgiving. She felt that she did better the last few weeks than she did prior to her last appointment. She is doing some pasta fruit and vegetables and eating chips for snacks. Her husband is cooking for Christmas.  Subjective:   1. Essential hypertension Angela Morrison's blood pressure is elevated again today. She was previously on amlodipine and benazepril. She denies chest pain, chest pressure, or headache.  2. Type 2 diabetes mellitus with hyperglycemia, without long-term current use of insulin (HCC) Angela Morrison is on Actos, and she denies hypoglycemia. She does endorse increased intake of carbohydrates recently.  Assessment/Plan:   1. Essential hypertension Angela Morrison is working on healthy weight loss and exercise to improve blood pressure control. We will watch for signs of hypotension as she continues her lifestyle modifications. We will follow up on her blood pressure at her next appointment.  2. Type 2 diabetes mellitus with hyperglycemia, without long-term current use of insulin (HCC) Good blood sugar control is important to decrease the likelihood of diabetic complications such as nephropathy, neuropathy, limb loss, blindness, coronary artery disease, and death. Intensive lifestyle modification including diet, exercise and weight loss are the first line of treatment for diabetes. Myndi will continue her  current medications, no change in dose. We will recheck labs in 1 month.  3. Class 3 severe obesity with serious comorbidity and body mass index (BMI) of 40.0 to 44.9 in adult, unspecified obesity type [facility]) Angela Morrison is currently in the action stage of change. As such, her goal is to continue with weight loss efforts. She has agreed to practicing portion control and making smarter food choices, such as increasing vegetables and decreasing simple carbohydrates.   Exercise goals: As is.  Behavioral modification strategies: increasing lean protein intake, meal planning and cooking strategies, keeping healthy foods in the home and planning for success.  Angela Morrison has agreed to follow-up with our clinic in 3 weeks. She was informed of the importance of frequent follow-up visits to maximize her success with intensive lifestyle modifications for her multiple health conditions.   Objective:   Blood pressure (!) 148/88, pulse 81, temperature 98.3 F (36.8 C), temperature source Oral, height 5\' 4"  (1.626 m), weight 244 lb (110.7 kg), SpO2 97 %. Body mass index is 41.88 kg/m.  General: Cooperative, alert, well developed, in no acute distress. HEENT: Conjunctivae and lids unremarkable. Cardiovascular: Regular rhythm.  Lungs: Normal work of breathing. Neurologic: No focal deficits.   Lab Results  Component Value Date   CREATININE 0.64 07/24/2019   BUN 8 07/24/2019   NA 140 07/24/2019   K 3.9 07/24/2019   CL 101 07/24/2019   CO2 22 07/24/2019   Lab Results  Component Value Date   ALT 15 08/21/2019   AST 14 08/21/2019   ALKPHOS 176 (H) 08/21/2019   BILITOT 1.2 08/21/2019   No results found for: HGBA1C No results found for: INSULIN No results found for: TSH Lab Results  Component Value Date   CHOL 168 08/21/2019   HDL 50 08/21/2019   LDLCALC 104 (H) 08/21/2019   TRIG 75 08/21/2019   CHOLHDL 3.4 08/21/2019   Lab Results  Component Value Date   WBC 8.7 06/25/2017   HGB 14.3  06/25/2017   HCT 44.0 06/25/2017   MCV 88.9 06/25/2017   PLT 255 06/25/2017   No results found for: IRON, TIBC, FERRITIN  Attestation Statements:   Reviewed by clinician on day of visit: allergies, medications, problem list, medical history, surgical history, family history, social history, and previous encounter notes.  Time spent on visit including pre-visit chart review and post-visit care and charting was 15 minutes.    I, Burt Knack, am acting as transcriptionist for [name], MD.  I have reviewed the above documentation for accuracy and completeness, and I agree with the above. - Katherina Mires, MD

## 2020-02-12 ENCOUNTER — Telehealth: Payer: Self-pay | Admitting: *Deleted

## 2020-02-12 ENCOUNTER — Encounter: Payer: Medicare Other | Admitting: Cardiology

## 2020-02-12 VITALS — Ht 64.0 in | Wt 245.0 lb

## 2020-02-12 NOTE — Progress Notes (Signed)
This encounter was created in error - please disregard.

## 2020-02-12 NOTE — Telephone Encounter (Signed)
She needs to check with [facility] as they are monitoring her for hypotension with weight loss

## 2020-02-12 NOTE — Telephone Encounter (Signed)
I spoke with patient and gave her information from Dr Mayford Knife.  I told her I would forward message to Dr Lawson Radar at [facility].

## 2020-02-12 NOTE — Telephone Encounter (Signed)
Patient was scheduled to see Dr Mayford Knife for video visit today but was unable to connect to visit.  Visit has been rescheduled to in office with Dr Mayford Knife on January 21,2022.   Patient reports she needs refill on hydrodiuril 25 mg daily.  This is not on current medication list. It was removed from her list at 12/23/19 visit at [facility] visit with note that patient reported she was not taking. Patient reports she thinks she has been taking until a week ago when she ran out.  Will check with Dr Mayford Knife to see if patient should continue.  If she is to continue will need prescription sent to [facility] on [place]

## 2020-02-20 ENCOUNTER — Other Ambulatory Visit: Payer: Self-pay

## 2020-02-20 ENCOUNTER — Telehealth: Payer: Self-pay | Admitting: Cardiology

## 2020-02-20 ENCOUNTER — Encounter (INDEPENDENT_AMBULATORY_CARE_PROVIDER_SITE_OTHER): Payer: Self-pay | Admitting: Family Medicine

## 2020-02-20 ENCOUNTER — Telehealth (INDEPENDENT_AMBULATORY_CARE_PROVIDER_SITE_OTHER): Payer: Medicare Other | Admitting: Family Medicine

## 2020-02-20 DIAGNOSIS — E876 Hypokalemia: Secondary | ICD-10-CM

## 2020-02-20 MED ORDER — POTASSIUM CHLORIDE ER 10 MEQ PO TBCR: 10.0000 meq | EXTENDED_RELEASE_TABLET | Freq: Every day | ORAL | 1 refills | Status: DC

## 2020-02-20 NOTE — Telephone Encounter (Signed)
*  STAT* If patient is at the pharmacy, call can be transferred to refill team.   1. Which medications need to be refilled? (please list name of each medication and dose if known) potassium chloride (KLOR-CON) 10 MEQ tablet  2. Which pharmacy/location (including street and city if local pharmacy) is medication to be sent to? [facility] - [place] ([place]), [place] - [address]  3. Do they need a 30 day or 90 day supply? 90 day supply

## 2020-02-20 NOTE — Telephone Encounter (Signed)
Pt's medication was sent to pt's pharmacy as requested. Confirmation received.  °

## 2020-02-20 NOTE — Telephone Encounter (Signed)
Called pt and left message informing pt that her medication was sent to your pharmacy today and that she needed to call her pharmacy and speak with a live person, because the automatic system will not recognize a new prescription and if she has any other problems, questions or concerns, to give our office a call back.

## 2020-02-20 NOTE — Telephone Encounter (Signed)
*  STAT* If patient is at the pharmacy, call can be transferred to refill team.   1. Which medications need to be refilled? (please list name of each medication and dose if known) potassium chloride (KLOR-CON) 10 MEQ tablet  2. Which pharmacy/location (including street and city if local pharmacy) is medication to be sent to? [facility] - [place] ([place]), [place] - [address]  3. Do they need a 30 day or 90 day supply? 90 day  Patient is out of medication

## 2020-02-28 ENCOUNTER — Ambulatory Visit: Payer: Medicare Other | Admitting: Cardiology

## 2020-03-04 ENCOUNTER — Other Ambulatory Visit: Payer: Self-pay | Admitting: Family Medicine

## 2020-03-04 DIAGNOSIS — M85851 Other specified disorders of bone density and structure, right thigh: Secondary | ICD-10-CM

## 2020-03-06 IMAGING — NM NM MISC PROCEDURE
6 series · 36 of 36 positions shown · non-contrast
Comparison: none

[Series 1: stress-gsp · 6.40mm/px · 6 of 509 frames shown]
[frame 43/509  full-range]
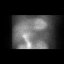
[frame 127/509  full-range]
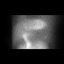
[frame 212/509  full-range]
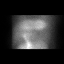
[frame 297/509  full-range]
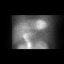
[frame 382/509  full-range]
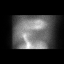
[frame 467/509  full-range]
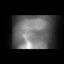

[Series 1: wbr_s-proj_st stress-gsp · 6.40mm/px · 6 of 512 frames shown]
[frame 43/512]
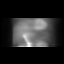
[frame 128/512]
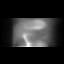
[frame 214/512]
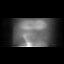
[frame 299/512]
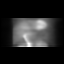
[frame 384/512]
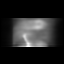
[frame 470/512]
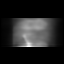

[Series 1: wbr_r-proj_st rest · 6.40mm/px · 6 of 64 frames shown]
[frame 6/64]
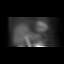
[frame 16/64]
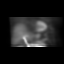
[frame 27/64]
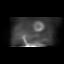
[frame 38/64]
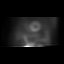
[frame 48/64]
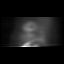
[frame 59/64]
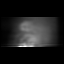

[Series 1: wbr_s-proj_st stress-sum-em · 6.40mm/px · 6 of 64 frames shown]
[frame 6/64]
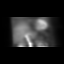
[frame 16/64]
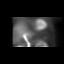
[frame 27/64]
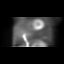
[frame 38/64]
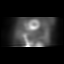
[frame 48/64]
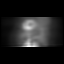
[frame 59/64]
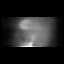

[Series 1: stress-sum-em · 6.40mm/px · 6 of 61 frames shown]
[frame 6/61]
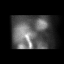
[frame 16/61]
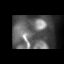
[frame 26/61]
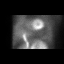
[frame 36/61]
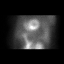
[frame 46/61]
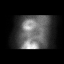
[frame 56/61]
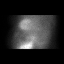

[Series 1: rest · 6.40mm/px · 6 of 64 frames shown]
[frame 6/64]
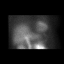
[frame 16/64]
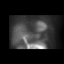
[frame 27/64]
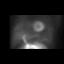
[frame 38/64]
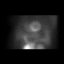
[frame 48/64]
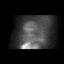
[frame 59/64]
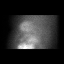

[36 of 36 positions shown; findings below may reference images not displayed]

Canned report from images found in remote index.

Refer to host system for actual result text.

## 2020-03-12 ENCOUNTER — Ambulatory Visit (INDEPENDENT_AMBULATORY_CARE_PROVIDER_SITE_OTHER): Payer: Medicare Other | Admitting: Adult Health

## 2020-03-12 ENCOUNTER — Other Ambulatory Visit: Payer: Self-pay

## 2020-03-12 ENCOUNTER — Encounter (INDEPENDENT_AMBULATORY_CARE_PROVIDER_SITE_OTHER): Payer: Self-pay | Admitting: Adult Health

## 2020-03-12 VITALS — BP 128/85 | HR 100 | Temp 98.1°F | Ht 64.0 in | Wt 233.0 lb

## 2020-03-12 DIAGNOSIS — E1159 Type 2 diabetes mellitus with other circulatory complications: Secondary | ICD-10-CM

## 2020-03-12 DIAGNOSIS — I152 Hypertension secondary to endocrine disorders: Secondary | ICD-10-CM

## 2020-03-12 DIAGNOSIS — E118 Type 2 diabetes mellitus with unspecified complications: Secondary | ICD-10-CM

## 2020-03-12 DIAGNOSIS — Z6841 Body Mass Index (BMI) 40.0 and over, adult: Secondary | ICD-10-CM

## 2020-03-16 NOTE — Progress Notes (Signed)
Chief Complaint:   OBESITY Angela Morrison is here to discuss her progress with her obesity treatment plan along with follow-up of her obesity related diagnoses. Angela Morrison is on practicing portion control and making smarter food choices, such as increasing vegetables and decreasing simple carbohydrates and states she is following her eating plan approximately 50% of the time. Angela Morrison states she is cardio (walking) 15-20 minutes 2-3 times per week.  Today's visit was #: 14 Starting weight: 265 lbs Starting date: 05/23/2019 Today's weight: 233 lbs Today's date: 03/12/2020 Total lbs lost to date: 32 lbs Total lbs lost since last in-office visit: 13 lbs  Interim History: Pt is on [employer] meal plan: Breakfast is Frosted Flakes with [employer] milk, Lunch is cold cut sandwich, and Dinner is meat protein, small serving of starch, and 2 cups of vegetables.  Her interval goals are to drink more water and continue to lose- goal of 5 lbs weight loss at next OV.  Subjective:   1. Hypertension associated with diabetes (HCC) Pt's BP is at goal with slightly elevated heart rate of 100 BMP. She denies acute cardiac symptoms. She is on Diovan 80 mg daily, managed by her PCP.  BP Readings from Last 3 Encounters:  03/12/20 128/85  01/27/20 (!) 148/88  12/23/19 (!) 147/83    2. Type 2 diabetes mellitus with complication, without long-term current use of insulin (HCC) Pt's Actos was discontinued over 6 months ago, managed by [facility].  She is on Jardiance 10 mg daily, managed by PCP/ Dr. Uvaldo Rising. She denies symptoms of hypoglycemia.  No results found for: HGBA1C Lab Results  Component Value Date   LDLCALC 104 (H) 08/21/2019   CREATININE 0.64 07/24/2019   No results found for: INSULIN  Assessment/Plan:   1. Hypertension associated with diabetes (HCC) Tahiry is working on healthy weight loss and exercise to improve blood pressure control. We will watch for signs of hypotension as she  continues her lifestyle modifications. Continue Diovan and limit sodium intake.  2. Type 2 diabetes mellitus with complication, without long-term current use of insulin (HCC) Good blood sugar control is important to decrease the likelihood of diabetic complications such as nephropathy, neuropathy, limb loss, blindness, coronary artery disease, and death. Intensive lifestyle modification including diet, exercise and weight loss are the first line of treatment for diabetes. Continue Jardiance and regular exercise.   3. Class 3 severe obesity with serious comorbidity and body mass index (BMI) of 40.0 to 44.9 in adult, unspecified obesity type [facility]) Angela Morrison is currently in the action stage of change. As such, her goal is to continue with weight loss efforts. She has agreed to practicing portion control and making smarter food choices, such as increasing vegetables and decreasing simple carbohydrates.   Exercise goals: As is  Behavioral modification strategies: increasing lean protein intake, increasing water intake, meal planning and cooking strategies and planning for success.  Imara has agreed to follow-up with our clinic in 3-4 weeks. She was informed of the importance of frequent follow-up visits to maximize her success with intensive lifestyle modifications for her multiple health conditions.   Objective:   Blood pressure 128/85, pulse 100, temperature 98.1 F (36.7 C), height 5\' 4"  (1.626 m), weight 233 lb (105.7 kg), SpO2 98 %. Body mass index is 39.99 kg/m.  General: Cooperative, alert, well developed, in no acute distress. HEENT: Conjunctivae and lids unremarkable. Cardiovascular: Regular rhythm.  Lungs: Normal work of breathing. Neurologic: No focal deficits.   Lab Results  Component  Value Date   CREATININE 0.64 07/24/2019   BUN 8 07/24/2019   NA 140 07/24/2019   K 3.9 07/24/2019   CL 101 07/24/2019   CO2 22 07/24/2019   Lab Results  Component Value Date   ALT 15  08/21/2019   AST 14 08/21/2019   ALKPHOS 176 (H) 08/21/2019   BILITOT 1.2 08/21/2019   No results found for: HGBA1C No results found for: INSULIN No results found for: TSH Lab Results  Component Value Date   CHOL 168 08/21/2019   HDL 50 08/21/2019   LDLCALC 104 (H) 08/21/2019   TRIG 75 08/21/2019   CHOLHDL 3.4 08/21/2019   Lab Results  Component Value Date   WBC 8.7 06/25/2017   HGB 14.3 06/25/2017   HCT 44.0 06/25/2017   MCV 88.9 06/25/2017   PLT 255 06/25/2017   No results found for: IRON, TIBC, FERRITIN  Attestation Statements:   Reviewed by clinician on day of visit: allergies, medications, problem list, medical history, surgical history, family history, social history, and previous encounter notes.  Time spent on visit including pre-visit chart review and post-visit care and charting was 33 minutes.   Edmund Hilda, am acting as Energy manager for William Hamburger, NP.  I have reviewed the above documentation for accuracy and completeness, and I agree with the above. -  Deavon Podgorski d. Lavaeh Bau, NP-C

## 2020-03-18 ENCOUNTER — Ambulatory Visit: Payer: Medicare Other | Admitting: Cardiology

## 2020-03-19 ENCOUNTER — Other Ambulatory Visit: Payer: Self-pay

## 2020-03-19 DIAGNOSIS — M79606 Pain in leg, unspecified: Secondary | ICD-10-CM

## 2020-03-24 ENCOUNTER — Ambulatory Visit: Payer: Medicare Other | Attending: Family Medicine | Admitting: Physical Therapy

## 2020-03-24 ENCOUNTER — Other Ambulatory Visit: Payer: Self-pay

## 2020-03-24 ENCOUNTER — Encounter: Payer: Self-pay | Admitting: Physical Therapy

## 2020-03-24 DIAGNOSIS — M6281 Muscle weakness (generalized): Secondary | ICD-10-CM | POA: Diagnosis not present

## 2020-03-24 DIAGNOSIS — R2681 Unsteadiness on feet: Secondary | ICD-10-CM | POA: Insufficient documentation

## 2020-03-24 DIAGNOSIS — R2689 Other abnormalities of gait and mobility: Secondary | ICD-10-CM | POA: Insufficient documentation

## 2020-03-24 NOTE — Therapy (Signed)
[facility] [address] [place], [place], [address] Phone: [phone]   Fax:  [phone]  Physical Therapy Evaluation  Patient Details  Name: Angela Morrison MRN: [id number] Date of Birth: 05/05/1950 Referring Provider (PT): Gweneth Dimitri, MD   Encounter Date: 03/24/2020   PT End of Session - 03/24/20 0902    Visit Number 1    Number of Visits 13    Date for PT Re-Evaluation 05/05/20    Authorization Type MCR: kx mod at 15th visit, FOTO 6th and 10th visit.    PT Start Time 0848    PT Stop Time 0928    PT Time Calculation (min) 40 min    Activity Tolerance Patient tolerated treatment well    Behavior During Therapy [facility] for tasks assessed/performed           Past Medical History:  Diagnosis Date  . Anemia    few yrs ago none recent  . Arthritis   . Constipation   . Coronary artery disease 2005   S/P PCI of RCA  . Depression   . Diabetes mellitus without complication (HCC)    type 2  . Edema, lower extremity   . History of MI (myocardial infarction)   . Hyperlipidemia   . Hypertension   . Leg cramps   . Lower back pain   . Obesity   . PONV (postoperative nausea and vomiting)   . Shoulder pain   . Thyroid nodule    S/P bx 8/13 c/w goiter  . Vitamin D deficiency   . Weakness     Past Surgical History:  Procedure Laterality Date  . appendectomy Right   . BACK SURGERY     lower back  . CHOLECYSTECTOMY N/A 07/02/2017   Procedure: LAPAROSCOPIC CHOLECYSTECTOMY WITH INTRAOPERATIVE CHOLANGIOGRAM;  Surgeon: Glenna Fellows, MD;  Location: [facility];  Service: General;  Laterality: N/A;  . COLONOSCOPY WITH PROPOFOL N/A 05/17/2016   Procedure: COLONOSCOPY WITH PROPOFOL;  Surgeon: Charolett Bumpers, MD;  Location: [facility] END[facility];  Service: Endoscopy;  Laterality: N/A;    There were no vitals filed for this visit.    Subjective Assessment - 03/24/20 0854    Subjective pt is a 70 y.o F with CC of weakness in the  legs that started about 6 months ago. She reports she did have a fall down 7-8 steps and is unsure if this was cause. She currently denies any pain. she reports no hx of back or LE pain or issues. she denies any N/T or other issues.    How long can you sit comfortably? unlimited    How long can you stand comfortably? 15 - 20 min    How long can you walk comfortably? 15 - 20 min    Diagnostic tests n/a    Patient Stated Goals strengthening the legs.    Currently in Pain? No/denies              [facility] PT Assessment - 03/24/20 0856      Assessment   Medical Diagnosis Other abnormalities of gait and mobility (R26.89)    Referring Provider (PT) Gweneth Dimitri, MD    Onset Date/Surgical Date --   6 months ago   Hand Dominance Right    Next MD Visit --   next year   Prior Therapy yes      Precautions   Precautions None      Restrictions   Weight Bearing Restrictions No  Balance Screen   Has the patient fallen in the past 6 months Yes    How many times? 1    Has the patient had a decrease in activity level because of a fear of falling?  No    Is the patient reluctant to leave their home because of a fear of falling?  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 [employer]      Cognition   Overall Cognitive Status Within Capital OneFunctional Limits for tasks assessed      Observation/Other Assessments   Focus on Therapeutic Outcomes (FOTO)  48% function    predicted 60% function     Posture/Postural Control   Posture/Postural Control Postural limitations    Postural Limitations Rounded Shoulders;Forward head      ROM / Strength   AROM / PROM / Strength AROM;Strength      Strength   Strength Assessment Site Hip;Knee    Right/Left Hip Right;Left    Right Hip Flexion 4-/5    Right Hip Extension 3+/5    Right Hip ABduction 3+/5    Right Hip ADduction 3+/5    Left Hip Flexion 3+/5    Left Hip Extension 3+/5    Left Hip ABduction 3+/5    Left Hip ADduction 3+/5    Right/Left Knee Right;Left    Right Knee Flexion 4/5    Right Knee Extension 4/5    Left Knee Flexion 4-/5    Left Knee Extension 4/5      Ambulation/Gait   Ambulation/Gait Yes    Assistive device None    Gait Pattern Step-through pattern;Wide base of support;Trendelenburg      Standardized Balance Assessment   Standardized Balance Assessment Five Times Sit to Stand;Timed Up and Go Test    Five times sit to stand comments  19      Timed Up and Go Test   TUG Comments 14.6   16,14,14                     Objective measurements completed on examination: See above findings.               PT Education - 03/24/20 0903    Education Details evaluation findings, POC, goals, HEp with proper form/ rationale, FOTO assessment.    Person(s) Educated Patient    Methods Explanation;Verbal cues    Comprehension Verbalized understanding;Verbal cues required            PT Short Term Goals - 03/24/20 0906      PT SHORT TERM GOAL #1   Title pt to be IND with inital HEP    Time 3    Period Weeks    Status New    Target Date 04/14/20      PT SHORT TERM GOAL #2   Title  Assess BERG balance and update goals PRN    Time 1    Period Weeks    Status New    Target Date 03/31/20             PT Long Term Goals - 03/24/20 1043      PT LONG TERM GOAL #1   Title pt to increase gross LE strength to >/= 4+/5 to promote hip / knee stability    Time 6     Period Weeks    Status New    Target Date 05/05/20      PT LONG TERM GOAL #2   Title pt to increase walking/ standing to >/= 60 min for in home and community distances with no report of instability    Time 6    Period Weeks    Status New    Target Date 05/05/20      PT LONG TERM GOAL #3   Title reduce 5 x STS to and </= 12 seconds and Tug to </= 10 seconds for functional stability and mobility    Time 6    Period Weeks    Status New    Target Date 05/05/20      PT LONG TERM GOAL #4   Title increase FOTO Score to >/= 60% to demo improvement in function    Time 6    Period Weeks    Status New    Target Date 05/05/20      PT LONG TERM GOAL #5   Title pt to be IND with all HEP and is able to maintain and progress current LOF IND.    Time 6    Period Weeks    Status New    Target Date 05/05/20                  Plan - 03/24/20 7829    Clinical Impression Statement pt is a pleasant 70 y.o F presenting to OPPT with CC of weakness in bil LE starting about 6 months ago. she has functional hip/ knee ROM but demonsrates bil gross weakness with no report of pain. She ambulates with a wide BOS trendelenberg pattern. functional testing required increased time with 5x STS at 19 seconds, TUG at 14.6 seconds. She would benefit from physical therapy to promote gross LE strength, promote balance / stability, improve walking endurnace and maximize her function by addressing the deficits listed.    Personal Factors and Comorbidities Comorbidity 3+    Comorbidities hx of MI, depression, DM    Stability/Clinical Decision Making Evolving/Moderate complexity    Clinical Decision Making Moderate    Rehab Potential Good    PT Frequency 2x / week    PT Duration 6 weeks    PT Treatment/Interventions ADLs/Self Care Home Management;Cryotherapy;Electrical Stimulation;Moist Heat;Ultrasound;Therapeutic activities;Balance training;Neuromuscular re-education;Dry needling;Patient/family  education;Gait training;Stair training;Functional mobility training;Taping;Wheelchair mobility training;Manual techniques;Therapeutic exercise    PT Next Visit Plan review/ update HEP PRN, BERG balance assessment, gross LE strengthening, gait training, pt doesn't tolerate laying down/ sidelying well    PT Home Exercise Plan [id number] - sit to stand, LAQ, standing hip abduction, standing heel raise,    Consulted and Agree with Plan of Care Patient           Patient will benefit from skilled therapeutic intervention in order to improve the following deficits and impairments:  Abnormal gait,Increased muscle spasms,Decreased endurance,Decreased balance  Visit Diagnosis: Muscle weakness (generalized)  Unsteadiness on feet  Other abnormalities of gait and  mobility     Problem List Patient Active Problem List   Diagnosis Date Noted  . Morbid obesity (HCC) 06/10/2019  . Snoring 06/10/2019  . Excessive daytime sleepiness 06/10/2019  . Hyperlipidemia 12/19/2017  . Cholelithiasis and acute cholecystitis without obstruction 07/02/2017  . 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 Coronary atherosclerosis of native coronary artery 06/21/2013  . Essential hypertension, benign 06/21/2013  . Encounter for long-term (current) use of other medications 06/21/2013    Lulu Riding PT, DPT, LAT, ATC  03/24/20  11:01 AM      [facility] [address] [place], [place], [address] Phone: [phone]   Fax:  [phone]  Name: Angela Morrison MRN: [id number] Date of Birth: 1950/12/16

## 2020-03-25 ENCOUNTER — Encounter (HOSPITAL_COMMUNITY): Payer: Medicare Other

## 2020-03-31 ENCOUNTER — Ambulatory Visit (INDEPENDENT_AMBULATORY_CARE_PROVIDER_SITE_OTHER): Payer: Medicare Other | Admitting: Family Medicine

## 2020-04-03 ENCOUNTER — Ambulatory Visit (HOSPITAL_COMMUNITY)
Admission: RE | Admit: 2020-04-03 | Discharge: 2020-04-03 | Disposition: A | Discharge status: Home / Self Care | Payer: Medicare Other | Source: Ambulatory Visit | Attending: Vascular Surgery | Admitting: Vascular Surgery

## 2020-04-03 ENCOUNTER — Other Ambulatory Visit: Payer: Self-pay

## 2020-04-03 ENCOUNTER — Ambulatory Visit (INDEPENDENT_AMBULATORY_CARE_PROVIDER_SITE_OTHER): Payer: Medicare Other | Admitting: Physician Assistant

## 2020-04-03 VITALS — BP 147/97 | HR 75 | Temp 97.2°F | Resp 20 | Ht 64.5 in | Wt 238.8 lb

## 2020-04-03 DIAGNOSIS — M79606 Pain in leg, unspecified: Secondary | ICD-10-CM | POA: Diagnosis not present

## 2020-04-03 DIAGNOSIS — I739 Peripheral vascular disease, unspecified: Secondary | ICD-10-CM

## 2020-04-03 NOTE — Progress Notes (Signed)
History of Present Illness:  Patient is a 70 y.o. year old female who presents for evaluation of PAD after in home screening with reported abnormal ABI.  She denise symptoms of claudication, rest pain and non healing wounds.  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      Past Medical History:  Diagnosis Date  . Anemia    few yrs ago none recent  . Arthritis   . Constipation   . Coronary artery disease 2005   S/P PCI of RCA  . Depression   . Diabetes mellitus without complication (HCC)    type 2  . Edema, lower extremity   . History of MI (myocardial infarction)   . Hyperlipidemia   . Hypertension   . Leg cramps   . Lower back pain   . Obesity   . PONV (postoperative nausea and vomiting)   . Shoulder pain   . Thyroid nodule    S/P bx 8/13 c/w goiter  . Vitamin D deficiency   . Weakness     Past Surgical History:  Procedure Laterality Date  . appendectomy Right   . BACK SURGERY     lower back  . CHOLECYSTECTOMY N/A 07/02/2017   Procedure: LAPAROSCOPIC CHOLECYSTECTOMY WITH INTRAOPERATIVE CHOLANGIOGRAM;  Surgeon: Glenna Fellows, MD;  Location: [facility];  Service: General;  Laterality: N/A;  . COLONOSCOPY WITH PROPOFOL N/A 05/17/2016   Procedure: COLONOSCOPY WITH PROPOFOL;  Surgeon: Charolett Bumpers, MD;  Location: [facility];  Service: Endoscopy;  Laterality: N/A;    ROS:   General:  No weight loss, Fever, chills  HEENT: No recent headaches, no nasal bleeding, no visual changes, no sore throat  Neurologic: No dizziness, blackouts, seizures. No recent symptoms of stroke or mini- stroke. No recent episodes of slurred speech, or temporary blindness.  Cardiac: No recent episodes of chest pain/pressure, no shortness of breath at rest.  No shortness of breath with exertion.  Denies history of atrial fibrillation or irregular heartbeat  Vascular: No history of rest pain in feet.  No history of claudication.  No history of non-healing ulcer,  No history of DVT   Pulmonary: No home oxygen, no productive cough, no hemoptysis,  No asthma or wheezing  Musculoskeletal:  [ ]  Arthritis, [ ]  Low back pain,  [x ] Joint pain  Hematologic:No history of hypercoagulable state.  No history of easy bleeding.  No history of anemia  Gastrointestinal: No hematochezia or melena,  No gastroesophageal reflux, no trouble swallowing  Urinary: [ ]  chronic Kidney disease, [ ]  on HD - [ ]  MWF or [ ]  TTHS, [ ]  Burning with urination, [ ]  Frequent urination, [ ]  Difficulty urinating;   Skin: No rashes  Psychological: No history of anxiety,  positive history of depression  Social History Social History   Tobacco Use  . Smoking status: Never Smoker  . Smokeless tobacco: Never Used  Vaping Use  . Vaping Use: Never used  Substance Use Topics  . Alcohol use: Yes    Comment: occ  . Drug use: No    Family History Family History  Problem Relation Age of Onset  . Alzheimer's disease Mother   . Hypertension Mother   . Dementia Father   . Hypertension Father   . Alcoholism Father     Allergies  Allergies  Allergen Reactions  . Crestor [Rosuvastatin]     constipation  . Morphine Sulfate Other (See Comments)  . Penicillins  Itching    Has patient had a PCN reaction causing immediate rash, facial/tongue/throat swelling, SOB or lightheadedness with hypotension: No Has patient had a PCN reaction causing severe rash involving mucus membranes or skin necrosis: No Has patient had a PCN reaction that required hospitalization No Has patient had a PCN reaction occurring within the last 10 years: No If all of the above answers are "NO", then may proceed with Cephalosporin use.     Current Outpatient Medications  Medication Sig Dispense Refill  . aspirin EC 81 MG tablet Take 81 mg by mouth at bedtime.    Marland Kitchen atorvastatin (LIPITOR) 80 MG tablet Take 1 tablet (80 mg total) by mouth daily. 90 tablet 3  . Cholecalciferol (VITAMIN D3) 2000 UNITS capsule  Take 2,000 Units by mouth daily.     . hydrochlorothiazide (HYDRODIURIL) 25 MG tablet Take 1 tablet by mouth daily.    Marland Kitchen JARDIANCE 10 MG TABS tablet Take 10 mg by mouth daily.    . Naproxen Sodium 220 MG CAPS 2 capsules with food or milk as needed    . Oyster Shell Calcium 500 MG TABS 1 tablet with meals    . Polyethyl Glycol-Propyl Glycol (SYSTANE) 0.4-0.3 % SOLN 1 drop into affected eye as needed    . potassium chloride (KLOR-CON) 10 MEQ tablet Take 1 tablet (10 mEq total) by mouth daily. 90 tablet 1  . valsartan (DIOVAN) 80 MG tablet Take 80 mg by mouth daily.     No current facility-administered medications for this visit.    Physical Examination  Vitals:   04/03/20 0957  BP: (!) 147/97  Pulse: 75  Resp: 20  Temp: (!) 97.2 F (36.2 C)  TempSrc: Temporal  SpO2: 99%  Weight: 238 lb 12.8 oz (108.3 kg)  Height: 5' 4.5" (1.638 m)    Body mass index is 40.36 kg/m.  General:  Alert and oriented, no acute distress HEENT: Normal Neck: No bruit or JVD Pulmonary: Clear to auscultation bilaterally Cardiac: Regular Rate and Rhythm without murmur Abdomen: Soft, non-tender, non-distended, no mass, no scars Skin: No rash Extremity Pulses:  2+ radial, brachial, femoral, dorsalis pedis, posterior tibial pulses bilaterally Musculoskeletal: No deformity or edema  Neurologic: Upper and lower extremity motor 5/5 and symmetric  DATA:    ABI Findings:  +---------+------------------+-----+---------+--------+  Right  Rt Pressure (mmHg)IndexWaveform Comment   +---------+------------------+-----+---------+--------+  Brachial 168                      +---------+------------------+-----+---------+--------+  PTA   198        1.18 triphasic      +---------+------------------+-----+---------+--------+  DP    185        1.10 triphasic      +---------+------------------+-----+---------+--------+  Great Toe141         0.84 Normal        +---------+------------------+-----+---------+--------+   +---------+------------------+-----+---------+-------+  Left   Lt Pressure (mmHg)IndexWaveform Comment  +---------+------------------+-----+---------+-------+  Brachial 158                      +---------+------------------+-----+---------+-------+  PTA   192        1.14 triphasic      +---------+------------------+-----+---------+-------+  DP    184        1.10 biphasic       +---------+------------------+-----+---------+-------+  Great Toe132        0.79 Normal        +---------+------------------+-----+---------+-------+   +-------+-----------+-----------+------------+------------+  ABI/TBIToday's ABIToday's TBIPrevious ABIPrevious  TBI  +-------+-----------+-----------+------------+------------+  Right 1.18    0.84                  +-------+-----------+-----------+------------+------------+  Left  1.14    0.79                  +-------+-----------+-----------+------------+------------+   Summary:  Right: Resting right ankle-brachial index is within normal range. No  evidence of significant right lower extremity arterial disease. The right  toe-brachial index is normal.   Left: Resting left ankle-brachial index is within normal range. No  evidence of significant left lower extremity arterial disease. The left  toe-brachial index is normal.    ASSESSMENT:  PAD home screening Calcified LE arteries with triphasic and biphasic flow.   She is asymptomatic without claudication, non healing wounds and no rest pain.    PLAN: She will f/u as needed.  I advised her to start a walking program or cycling and watch her blood glucose levels.  Cont. Daily ASA and Statin.  Mosetta Pigeon PA-C [facility] Office:  [phone]  MD in clinic [place]

## 2020-04-06 ENCOUNTER — Telehealth: Payer: Self-pay | Admitting: Physical Therapy

## 2020-04-06 ENCOUNTER — Ambulatory Visit: Payer: Medicare Other | Admitting: Physical Therapy

## 2020-04-06 NOTE — Telephone Encounter (Signed)
Spoke to patient regarding no show to appointment today. She reports she did not have this date on her calendar. Reviewed her next appointment dates and times. She does plan to attend future appointments.

## 2020-04-07 ENCOUNTER — Ambulatory Visit (INDEPENDENT_AMBULATORY_CARE_PROVIDER_SITE_OTHER): Payer: Medicare Other | Admitting: Family Medicine

## 2020-04-07 ENCOUNTER — Encounter (INDEPENDENT_AMBULATORY_CARE_PROVIDER_SITE_OTHER): Payer: Self-pay | Admitting: Family Medicine

## 2020-04-07 ENCOUNTER — Other Ambulatory Visit: Payer: Self-pay

## 2020-04-07 VITALS — BP 142/82 | HR 80 | Temp 98.2°F | Ht 64.0 in | Wt 232.0 lb

## 2020-04-07 DIAGNOSIS — E1169 Type 2 diabetes mellitus with other specified complication: Secondary | ICD-10-CM | POA: Diagnosis not present

## 2020-04-07 DIAGNOSIS — E118 Type 2 diabetes mellitus with unspecified complications: Secondary | ICD-10-CM | POA: Diagnosis not present

## 2020-04-07 DIAGNOSIS — E785 Hyperlipidemia, unspecified: Secondary | ICD-10-CM | POA: Diagnosis not present

## 2020-04-07 DIAGNOSIS — Z6839 Body mass index (BMI) 39.0-39.9, adult: Secondary | ICD-10-CM

## 2020-04-09 NOTE — Progress Notes (Signed)
Chief Complaint:   OBESITY Angela Morrison is here to discuss her progress with her obesity treatment plan along with follow-up of her obesity related diagnoses. Angela Morrison is on practicing portion control and making smarter food choices, such as increasing vegetables and decreasing simple carbohydrates and states she is following her eating plan approximately 50% of the time. Angela Morrison states she is walking 15-20 minutes 2 times per week.  Today's visit was #: 15 Starting weight: 265 lbs Starting date: 05/23/2019 Today's weight: 232 lbs Today's date: 04/07/2020 Total lbs lost to date: 33 lbs Total lbs lost since last in-office visit: 1 lb  Interim History: For the last few weeks, pt has included eating and working on incorporating more exercise. Energy wise, introducing activity has been difficult. She started physical therapy last week. Pt would like to decrease sweet intake and increase water intake over the next few weeks. Pt would like to decrease the amount of sweets coming into her house. She has no travel or celebratory plans for the next few weeks.  Subjective:   1. Type 2 diabetes mellitus with complication, without long-term current use of insulin (HCC) Angela Morrison is on [place]. She has no feelings of hypoglycemia. She is on Lipitor.  2. Hyperlipidemia associated with type 2 diabetes mellitus (HCC) Pt's last LDL 104, HDL 50, and triglycerides 75. She is on statin therapy.  Lab Results  Component Value Date   ALT 15 08/21/2019   AST 14 08/21/2019   ALKPHOS 176 (H) 08/21/2019   BILITOT 1.2 08/21/2019   Lab Results  Component Value Date   CHOL 168 08/21/2019   HDL 50 08/21/2019   LDLCALC 104 (H) 08/21/2019   TRIG 75 08/21/2019   CHOLHDL 3.4 08/21/2019    Assessment/Plan:   1. Type 2 diabetes mellitus with complication, without long-term current use of insulin (HCC) Good blood sugar control is important to decrease the likelihood of diabetic complications such as  nephropathy, neuropathy, limb loss, blindness, coronary artery disease, and death. Intensive lifestyle modification including diet, exercise and weight loss are the first line of treatment for diabetes. Pt has labs with Dr. Uvaldo Rising in the upcoming weeks. Follow up on results.  2. Hyperlipidemia associated with type 2 diabetes mellitus (HCC) Cardiovascular risk and specific lipid/LDL goals reviewed.  We discussed several lifestyle modifications today and Angela Morrison will continue to work on diet, exercise and weight loss efforts. Orders and follow up as documented in patient record. Continue statin therapy. Labs in the upcoming weeks.  Counseling Intensive lifestyle modifications are the first line treatment for this issue. . Dietary changes: Increase soluble fiber. Decrease simple carbohydrates. . Exercise changes: Moderate to vigorous-intensity aerobic activity 150 minutes per week if tolerated. . Lipid-lowering medications: see documented in medical record.  3. Class 2 severe obesity with serious comorbidity and body mass index (BMI) of 39.0 to 39.9 in adult, unspecified obesity type [facility]) Angela Morrison is currently in the action stage of change. As such, her goal is to continue with weight loss efforts. She has agreed to practicing portion control and making smarter food choices, such as increasing vegetables and decreasing simple carbohydrates.   Exercise goals: As is  Behavioral modification strategies: increasing lean protein intake and meal planning and cooking strategies.  Angela Morrison has agreed to follow-up with our clinic in 3-4 weeks. She was informed of the importance of frequent follow-up visits to maximize her success with intensive lifestyle modifications for her multiple health conditions.   Objective:   Blood pressure (!) 142/82,  pulse 80, temperature 98.2 F (36.8 C), temperature source Oral, height 5\' 4"  (1.626 m), weight 232 lb (105.2 kg), SpO2 98 %. Body mass index is 39.82  kg/m.  General: Cooperative, alert, well developed, in no acute distress. HEENT: Conjunctivae and lids unremarkable. Cardiovascular: Regular rhythm.  Lungs: Normal work of breathing. Neurologic: No focal deficits.   Lab Results  Component Value Date   CREATININE 0.64 07/24/2019   BUN 8 07/24/2019   NA 140 07/24/2019   K 3.9 07/24/2019   CL 101 07/24/2019   CO2 22 07/24/2019   Lab Results  Component Value Date   ALT 15 08/21/2019   AST 14 08/21/2019   ALKPHOS 176 (H) 08/21/2019   BILITOT 1.2 08/21/2019   No results found for: HGBA1C No results found for: INSULIN No results found for: TSH Lab Results  Component Value Date   CHOL 168 08/21/2019   HDL 50 08/21/2019   LDLCALC 104 (H) 08/21/2019   TRIG 75 08/21/2019   CHOLHDL 3.4 08/21/2019   Lab Results  Component Value Date   WBC 8.7 06/25/2017   HGB 14.3 06/25/2017   HCT 44.0 06/25/2017   MCV 88.9 06/25/2017   PLT 255 06/25/2017    Attestation Statements:   Reviewed by clinician on day of visit: allergies, medications, problem list, medical history, surgical history, family history, social history, and previous encounter notes.  Time spent on visit including pre-visit chart review and post-visit care and charting was 13 minutes.   06/27/2017, am acting as transcriptionist for Edmund Hilda, MD.   I have reviewed the above documentation for accuracy and completeness, and I agree with the above. - [name], MD

## 2020-04-10 ENCOUNTER — Ambulatory Visit: Payer: Medicare Other | Attending: Family Medicine | Admitting: Physical Therapy

## 2020-04-10 ENCOUNTER — Other Ambulatory Visit: Payer: Self-pay

## 2020-04-10 DIAGNOSIS — R2689 Other abnormalities of gait and mobility: Secondary | ICD-10-CM | POA: Diagnosis present

## 2020-04-10 DIAGNOSIS — M6281 Muscle weakness (generalized): Secondary | ICD-10-CM | POA: Diagnosis present

## 2020-04-10 DIAGNOSIS — R2681 Unsteadiness on feet: Secondary | ICD-10-CM | POA: Diagnosis present

## 2020-04-10 NOTE — Patient Instructions (Signed)
Access Code: [email or web address] URL: [email or web address] Date: 04/10/2020 Prepared by: Jannette Spanner  Exercises Sit to Stand - 1 x daily - 7 x weekly - 2 sets - 10 reps Standing Hip Abduction with Counter Support - 1 x daily - 7 x weekly - 2 sets - 10 reps Seated Long Arc Quad - 1 x daily - 7 x weekly - 2 sets - 15 reps Seated March - 1 x daily - 7 x weekly - 2 sets - 10 reps Standing Heel Raise with Chair Support - 1 x daily - 7 x weekly - 2 sets - 15 reps Feet Together Balance with Head Rotation - 1 x daily - 7 x weekly - 1 sets - 10 reps

## 2020-04-10 NOTE — Therapy (Signed)
[name], [place], [address] Phone: [phone]   Fax:  [phone]  Physical Therapy Treatment  Patient Details  Name: Angela Morrison MRN: [id number] Date of Birth: 07-18-1950 Referring Provider (PT): Cari Caraway, MD   Encounter Date: 04/10/2020   PT End of Session - 04/10/20 0749    Visit Number 2    Number of Visits 13    Date for PT Re-Evaluation 05/05/20    Authorization Type MCR: kx mod at 15th visit, FOTO 6th and 10th visit.    PT Start Time [phone]    PT Stop Time 0830    PT Time Calculation (min) 44 min           Past Medical History:  Diagnosis Date  . Anemia    few yrs ago none recent  . Arthritis   . Constipation   . Coronary artery disease 2005   S/P PCI of RCA  . Depression   . Diabetes mellitus without complication ([place])    type 2  . Edema, lower extremity   . History of MI (myocardial infarction)   . Hyperlipidemia   . Hypertension   . Leg cramps   . Lower back pain   . Obesity   . PONV (postoperative nausea and vomiting)   . Shoulder pain   . Thyroid nodule    S/P bx 8/13 c/w goiter  . Vitamin D deficiency   . Weakness     Past Surgical History:  Procedure Laterality Date  . appendectomy Right   . BACK SURGERY     lower back  . CHOLECYSTECTOMY N/A 07/02/2017   Procedure: LAPAROSCOPIC CHOLECYSTECTOMY WITH INTRAOPERATIVE CHOLANGIOGRAM;  Surgeon: Excell Seltzer, MD;  Location: [facility];  Service: General;  Laterality: N/A;  . COLONOSCOPY WITH PROPOFOL N/A 05/17/2016   Procedure: COLONOSCOPY WITH PROPOFOL;  Surgeon: Garlan Fair, MD;  Location[facility];  Service: Endoscopy;  Laterality: N/A;    There were no vitals filed for this visit.       [facility] PT Assessment - 04/10/20 0001      Standardized Balance Assessment   Standardized Balance Assessment Berg Balance Test      Berg Balance Test   Sit to Stand Able to stand without using hands and  stabilize independently    Standing Unsupported Able to stand safely 2 minutes    Sitting with Back Unsupported but Feet Supported on Floor or Stool Able to sit safely and securely 2 minutes    Stand to Sit Sits safely with minimal use of hands    Transfers Able to transfer safely, minor use of hands    Standing Unsupported with Eyes Closed Able to stand 10 seconds safely    Standing Unsupported with Feet Together Able to place feet together independently and stand 1 minute safely    From Standing, Reach Forward with Outstretched Arm Can reach forward >12 cm safely (5")    From Standing Position, Pick up Object from Floor Able to pick up shoe safely and easily    From Standing Position, Turn to Look Behind Over each Shoulder Looks behind from both sides and weight shifts well    Turn 360 Degrees Able to turn 360 degrees safely but slowly    Standing Unsupported, Alternately Place Feet on Step/Stool Able to stand independently and safely and complete 8 steps in 20 seconds    Standing Unsupported, One Foot in Front Able to plae foot ahead of the other  independently and hold 30 seconds    Standing on One Leg Tries to lift leg/unable to hold 3 seconds but remains standing independently    Total Score 49    Berg comment: 49/56                         [facility] Adult PT Treatment/Exercise - 04/10/20 0001      Neuro Re-ed    Neuro Re-ed Details  tandem trials with frequent UE touch required, rhomberg with head turns - frequent intermittent touch required      Exercises   Exercises Knee/Hip      Knee/Hip Exercises: Aerobic   Nustep L3 x 5 minutes      Knee/Hip Exercises: Standing   Heel Raises 10 reps;2 sets    Hip Abduction 10 reps;2 sets      Knee/Hip Exercises: Seated   Long Arc Quad 10 reps;2 sets    Marching 10 reps;2 sets    Sit to Sand 10 reps;2 sets                  PT Education - 04/10/20 0830    Education Details HEP    Person(s) Educated Patient     Methods Explanation;Handout    Comprehension Verbalized understanding            PT Short Term Goals - 04/10/20 0810      PT SHORT TERM GOAL #1   Title pt to be IND with inital HEP    Time 3    Period Weeks    Status On-going    Target Date 04/14/20      PT SHORT TERM GOAL #2   Title Assess BERG balance and update goals PRN    Baseline BERG 49/56    Time 1    Period Weeks    Status Partially Met    Target Date 03/31/20             PT Long Term Goals - 03/24/20 1043      PT LONG TERM GOAL #1   Title pt to increase gross LE strength to >/= 4+/5 to promote hip / knee stability    Time 6    Period Weeks    Status New    Target Date 05/05/20      PT LONG TERM GOAL #2   Title pt to increase walking/ standing to >/= 60 min for in home and community distances with no report of instability    Time 6    Period Weeks    Status New    Target Date 05/05/20      PT LONG TERM GOAL #3   Title reduce 5 x STS to and </= 12 seconds and Tug to </= 10 seconds for functional stability and mobility    Time 6    Period Weeks    Status New    Target Date 05/05/20      PT LONG TERM GOAL #4   Title increase FOTO Score to >/= 60% to demo improvement in function    Time 6    Period Weeks    Status New    Target Date 05/05/20      PT LONG TERM GOAL #5   Title pt to be IND with all HEP and is able to maintain and progress current LOF IND.    Time 6    Period Weeks    Status New    Target  Date 05/05/20                 Plan - 04/10/20 0803    Clinical Impression Statement Pt reports compliance with HEP. BERG captured today at 49/56. Began Nustep  and reviewed HEP.  Worked on tandem stance and Rhomberg stance. Added Rhomberg  with head turns  to HEP.    PT Next Visit Plan review/ update HEP PRN, BERG balance assessment, gross LE strengthening, gait training, pt doesn't tolerate laying down/ sidelying well    PT Home Exercise Plan [email or web address] - sit to stand, LAQ, seated march,  standing hip abduction, standing heel raise, added rhomberg with head turns           Patient will benefit from skilled therapeutic intervention in order to improve the following deficits and impairments:  Abnormal gait,Increased muscle spasms,Decreased endurance,Decreased balance  Visit Diagnosis: Muscle weakness (generalized)  Unsteadiness on feet  Other abnormalities of gait and mobility     Problem List Patient Active Problem List   Diagnosis Date Noted  . Morbid obesity ([place]) 06/10/2019  . Snoring 06/10/2019  . Excessive daytime sleepiness 06/10/2019  . Hyperlipidemia 12/19/2017  . Cholelithiasis and acute cholecystitis without obstruction 07/02/2017  . 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 Coronary atherosclerosis of native coronary artery 06/21/2013  . Essential hypertension, benign 06/21/2013  . Encounter for long-term (current) use of other medications 06/21/2013    Dorene Ar, PTA 04/10/2020, 8:51 AM  [place], [place], [address] Phone: [phone]   Fax:  [phone]  Name: KEIONA JENISON MRN: [id number] Date of Birth: May 08, 1950

## 2020-04-13 ENCOUNTER — Encounter: Payer: Self-pay | Admitting: Cardiology

## 2020-04-13 ENCOUNTER — Other Ambulatory Visit: Payer: Self-pay

## 2020-04-13 ENCOUNTER — Other Ambulatory Visit: Payer: Self-pay | Admitting: *Deleted

## 2020-04-13 ENCOUNTER — Ambulatory Visit (INDEPENDENT_AMBULATORY_CARE_PROVIDER_SITE_OTHER): Payer: Medicare Other | Admitting: Cardiology

## 2020-04-13 ENCOUNTER — Ambulatory Visit: Payer: Medicare Other | Admitting: Physical Therapy

## 2020-04-13 VITALS — BP 132/86 | HR 81 | Ht 64.5 in | Wt 236.2 lb

## 2020-04-13 DIAGNOSIS — E7800 Pure hypercholesterolemia, unspecified: Secondary | ICD-10-CM | POA: Diagnosis not present

## 2020-04-13 DIAGNOSIS — I10 Essential (primary) hypertension: Secondary | ICD-10-CM | POA: Diagnosis not present

## 2020-04-13 DIAGNOSIS — M6281 Muscle weakness (generalized): Secondary | ICD-10-CM | POA: Diagnosis not present

## 2020-04-13 DIAGNOSIS — I2510 Atherosclerotic heart disease of native coronary artery without angina pectoris: Secondary | ICD-10-CM

## 2020-04-13 DIAGNOSIS — R2681 Unsteadiness on feet: Secondary | ICD-10-CM

## 2020-04-13 DIAGNOSIS — R2689 Other abnormalities of gait and mobility: Secondary | ICD-10-CM

## 2020-04-13 LAB — LIPID PANEL
Chol/HDL Ratio: 3 ratio (ref 0.0–4.4)
Cholesterol, Total: 160 mg/dL (ref 100–199)
HDL: 53 mg/dL (ref >39)
LDL Chol Calc (NIH): 94 mg/dL (ref 0–99)
Triglycerides: 64 mg/dL (ref 0–149)
VLDL Cholesterol Cal: 13 mg/dL (ref 5–40)

## 2020-04-13 LAB — ALT: ALT: 17 IU/L (ref 0–32)

## 2020-04-13 NOTE — Patient Instructions (Signed)
Medication Instructions:  Your physician recommends that you continue on your current medications as directed. Please refer to the Current Medication list given to you today.  *If you need a refill on your cardiac medications before your next appointment, please call your pharmacy*  Lab Work: TODAY: Fasting lipids and ALT If you have labs (blood work) drawn today and your tests are completely normal, you will receive your results only by: MyChart Message (if you have MyChart) OR A paper copy in the mail If you have any lab test that is abnormal or we need to change your treatment, we will call you to review the results.  Follow-Up: At [facility], you and your health needs are our priority.  As part of our continuing mission to provide you with exceptional heart care, we have created designated Provider Care Teams.  These Care Teams include your primary Cardiologist (physician) and Advanced Practice Providers (APPs -  Physician Assistants and Nurse Practitioners) who all work together to provide you with the care you need, when you need it.  Your next appointment:   1 year(s)  The format for your next appointment:   In Person  Provider:   You may see Traci Turner, MD or one of the following Advanced Practice Providers on your designated Care Team:   Dayna Dunn, PA-C Michele Lenze, PA-C  

## 2020-04-13 NOTE — Progress Notes (Signed)
Cardiology Office Note:    Date:  04/13/2020   ID:  Angela Morrison, DOB 09-28-1950, MRN [id number]  PCP:  Gweneth Dimitri, MD  Cardiologist:  Armanda Magic, MD    Referring MD: Gweneth Dimitri, MD   Chief Complaint  Patient presents with  . Coronary Artery Disease  . Hyperlipidemia    History of Present Illness:    Angela Morrison is a 70 y.o. female with a hx of HTN, ASCAD s/p PCI of the RCA in 2005 and dyslipidemia.  She is here today for followup and is doing well.  She denies any chest pain or pressure, SOB, DOE, PND, orthopnea, LE edema, dizziness, palpitations or syncope. She is compliant with her meds and is tolerating meds with no SE.    Past Medical History:  Diagnosis Date  . Anemia    few yrs ago none recent  . Arthritis   . Constipation   . Coronary artery disease 2005   S/P PCI of RCA  . Depression   . Diabetes mellitus without complication (HCC)    type 2  . Edema, lower extremity   . History of MI (myocardial infarction)   . Hyperlipidemia   . Hypertension   . Leg cramps   . Lower back pain   . Obesity   . PONV (postoperative nausea and vomiting)   . Shoulder pain   . Thyroid nodule    S/P bx 8/13 c/w goiter  . Vitamin D deficiency   . Weakness     Past Surgical History:  Procedure Laterality Date  . appendectomy Right   . BACK SURGERY     lower back  . CHOLECYSTECTOMY N/A 07/02/2017   Procedure: LAPAROSCOPIC CHOLECYSTECTOMY WITH INTRAOPERATIVE CHOLANGIOGRAM;  Surgeon: Glenna Fellows, MD;  Location: [facility];  Service: General;  Laterality: N/A;  . COLONOSCOPY WITH PROPOFOL N/A 05/17/2016   Procedure: COLONOSCOPY WITH PROPOFOL;  Surgeon: Charolett Bumpers, MD;  Location: [facility];  Service: Endoscopy;  Laterality: N/A;    Current Medications: Current Meds  Medication Sig  . aspirin EC 81 MG tablet Take 81 mg by mouth at bedtime.  Marland Kitchen atorvastatin (LIPITOR) 80 MG tablet Take 1 tablet (80 mg total) by mouth daily.  . Cholecalciferol  (VITAMIN D3) 2000 UNITS capsule Take 2,000 Units by mouth daily.   . hydrochlorothiazide (HYDRODIURIL) 25 MG tablet Take 1 tablet by mouth daily.  Marland Kitchen JARDIANCE 10 MG TABS tablet Take 10 mg by mouth daily.  . Naproxen Sodium 220 MG CAPS 2 capsules with food or milk as needed  . Oyster Shell Calcium 500 MG TABS 1 tablet with meals  . Polyethyl Glycol-Propyl Glycol (SYSTANE) 0.4-0.3 % SOLN 1 drop into affected eye as needed  . potassium chloride (KLOR-CON) 10 MEQ tablet Take 1 tablet (10 mEq total) by mouth daily.  . valsartan (DIOVAN) 80 MG tablet Take 80 mg by mouth daily.     Allergies:   Crestor [rosuvastatin], Morphine sulfate, and Penicillins   Social History   Socioeconomic History  . Marital status: Married    Spouse name: Not on file  . Number of children: 1  . Years of education: Not on file  . Highest education level: Not on file  Occupational History  . Occupation: retired  Tobacco Use  . Smoking status: Never Smoker  . Smokeless tobacco: Never Used  Vaping Use  . Vaping Use: Never used  Substance and Sexual Activity  . Alcohol use: Yes    Comment: occ  .  Drug use: No  . Sexual activity: Not on file  Other Topics Concern  . Not on file  Social History Narrative  . Not on file   Social Determinants of Health   Financial Resource Strain: Not on file  Food Insecurity: Not on file  Transportation Needs: Not on file  Physical Activity: Not on file  Stress: Not on file  Social Connections: Not on file     Family History: The patient's family history includes Alcoholism in her father; Alzheimer's disease in her mother; Dementia in her father; Hypertension in her father and mother.  ROS:   Please see the history of present illness.    ROS  All other systems reviewed and negative.   EKGs/Labs/Other Studies Reviewed:    The following studies were reviewed today: none  EKG:  EKG is ordered today and showed NSR with nonspecific T wave abnormality  Recent  Labs: 07/24/2019: BUN 8; Creatinine, Ser 0.64; Potassium 3.9; Sodium 140 08/21/2019: ALT 15   Recent Lipid Panel    Component Value Date/Time   CHOL 168 08/21/2019 0853   TRIG 75 08/21/2019 0853   HDL 50 08/21/2019 0853   CHOLHDL 3.4 08/21/2019 0853   CHOLHDL 4 06/30/2014 0914   VLDL 16.8 06/30/2014 0914   LDLCALC 104 (H) 08/21/2019 0853    Physical Exam:    VS:  BP 132/86   Pulse 81   Ht 5' 4.5" (1.638 m)   Wt 236 lb 3.2 oz (107.1 kg)   BMI 39.92 kg/m     Wt Readings from Last 3 Encounters:  04/13/20 236 lb 3.2 oz (107.1 kg)  04/07/20 232 lb (105.2 kg)  04/03/20 238 lb 12.8 oz (108.3 kg)     GEN: Well nourished, well developed in no acute distress HEENT: Normal NECK: No JVD; No carotid bruits LYMPHATICS: No lymphadenopathy CARDIAC:RRR, no murmurs, rubs, gallops RESPIRATORY:  Clear to auscultation without rales, wheezing or rhonchi  ABDOMEN: Soft, non-tender, non-distended MUSCULOSKELETAL:  No edema; No deformity  SKIN: Warm and dry NEUROLOGIC:  Alert and oriented x 3 PSYCHIATRIC:  Normal affect    ASSESSMENT:    1. Atherosclerosis of native coronary artery of native heart without angina pectoris   2. Essential hypertension, benign   3. Pure hypercholesterolemia   4. Morbid obesity (HCC)    PLAN:    In order of problems listed above:  1.  ASCAD  -s/p PCI of the RCA in 2005.   -she has not had any anginal since we saw her last -continue ASA and statin.    2.  HTN  -BP is well controlled on exam today -she had LE edema on amlodipine -continue HCTZ 25mg  daily and Valsartan 80mg  daily -SCr stable at 0.87 in Feb 2022  3.  Hyperlipidemia  -LDL goal is < 70.   -LDL was 104 in July 2021 -repeat FLP and ALT -intolerant to Zetia -continue Atorvastatin 80mg  daily  4.  Morbid Obesity -she is going to healthy weight and wellness  Medication Adjustments/Labs and Tests Ordered: Current medicines are reviewed at length with the patient today.  Concerns  regarding medicines are outlined above.  Orders Placed This Encounter  Procedures  . EKG 12-Lead   No orders of the defined types were placed in this encounter.   Signed, Mar 2022, MD  04/13/2020 8:25 AM    [facility]

## 2020-04-13 NOTE — Therapy (Signed)
[place], [place], [address] Phone: [phone]   Fax:  [phone]  Physical Therapy Treatment  Patient Details  Name: Angela Morrison MRN: [id number] Date of Birth: 20-Jun-1950 Referring Provider (PT): Cari Caraway, MD   Encounter Date: 04/13/2020   PT End of Session - 04/13/20 1003    Visit Number 3    Number of Visits 13    Date for PT Re-Evaluation 05/05/20    Authorization Type MCR: kx mod at 15th visit, FOTO 6th and 10th visit.    PT Start Time 0930    PT Stop Time 1010    PT Time Calculation (min) 40 min           Past Medical History:  Diagnosis Date  . Anemia    few yrs ago none recent  . Arthritis   . Constipation   . Coronary artery disease 2005   S/P PCI of RCA  . Depression   . Diabetes mellitus without complication ([place])    type 2  . Edema, lower extremity   . History of MI (myocardial infarction)   . Hyperlipidemia   . Hypertension   . Leg cramps   . Lower back pain   . Obesity   . PONV (postoperative nausea and vomiting)   . Shoulder pain   . Thyroid nodule    S/P bx 8/13 c/w goiter  . Vitamin D deficiency   . Weakness     Past Surgical History:  Procedure Laterality Date  . appendectomy Right   . BACK SURGERY     lower back  . CHOLECYSTECTOMY N/A 07/02/2017   Procedure: LAPAROSCOPIC CHOLECYSTECTOMY WITH INTRAOPERATIVE CHOLANGIOGRAM;  Surgeon: Excell Seltzer, MD;  [facility];  Service: General;  Laterality: N/A;  . COLONOSCOPY WITH PROPOFOL N/A 05/17/2016   Procedure: COLONOSCOPY WITH PROPOFOL;  Surgeon: Garlan Fair, MD;  [facility];  Service: Endoscopy;  Laterality: N/A;    There were no vitals filed for this visit.                      [facility] Adult PT Treatment/Exercise - 04/13/20 0001      Ambulation/Gait   Ambulation Distance (Feet) 295 Feet    Gait Comments 2 MWT 295 ft      Neuro Re-ed    Neuro Re-ed  Details  tandem trials with frequent UE touch required, rhomberg with head turns - frequent intermittent touch required, side stepping at counter, retro walking at counter with 1 UE support      Knee/Hip Exercises: Aerobic   Nustep L2 x 6 minutes      Knee/Hip Exercises: Standing   Heel Raises 20 reps    Hip Abduction 10 reps;2 sets      Knee/Hip Exercises: Seated   Long Arc Quad 10 reps;2 sets    Long Arc Quad Limitations red band    Marching 10 reps;2 sets    Marching Limitations red band    Hamstring Curl 10 reps;2 sets    Hamstring Limitations red band    Sit to Sand 10 reps;2 sets   cues to stay at edge of mat                   PT Short Term Goals - 04/10/20 0810      PT SHORT TERM GOAL #1   Title pt to be IND with inital HEP    Time 3  Period Weeks    Status On-going    Target Date 04/14/20      PT SHORT TERM GOAL #2   Title Assess BERG balance and update goals PRN    Baseline BERG 49/56    Time 1    Period Weeks    Status Partially Met    Target Date 03/31/20             PT Long Term Goals - 03/24/20 1043      PT LONG TERM GOAL #1   Title pt to increase gross LE strength to >/= 4+/5 to promote hip / knee stability    Time 6    Period Weeks    Status New    Target Date 05/05/20      PT LONG TERM GOAL #2   Title pt to increase walking/ standing to >/= 60 min for in home and community distances with no report of instability    Time 6    Period Weeks    Status New    Target Date 05/05/20      PT LONG TERM GOAL #3   Title reduce 5 x STS to and </= 12 seconds and Tug to </= 10 seconds for functional stability and mobility    Time 6    Period Weeks    Status New    Target Date 05/05/20      PT LONG TERM GOAL #4   Title increase FOTO Score to >/= 60% to demo improvement in function    Time 6    Period Weeks    Status New    Target Date 05/05/20      PT LONG TERM GOAL #5   Title pt to be IND with all HEP and is able to maintain and  progress current LOF IND.    Time 6    Period Weeks    Status New    Target Date 05/05/20                 Plan - 04/13/20 1112    Clinical Impression Statement Pt arrives reporting comppliance with HEP including new balance exercise. Continued with Nustep, balance and LE strengthening. Captured 2 Minute Walk Test at 295 feet. She had no increased pain, only fatigue agfter therex. Able to add red band resistance to her seated HEP.    PT Next Visit Plan review/ update HEP PRN, Set BERG goal, gross LE strengthening, gait training, pt doesn't tolerate laying down/ sidelying well    PT Home Exercise Plan RQYRPLMA - sit to stand, LAQ, seated march, standing hip abduction, standing heel raise, added rhomberg with head turns    Consulted and Agree with Plan of Care Patient           Patient will benefit from skilled therapeutic intervention in order to improve the following deficits and impairments:  Abnormal gait,Increased muscle spasms,Decreased endurance,Decreased balance  Visit Diagnosis: Muscle weakness (generalized)  Unsteadiness on feet  Other abnormalities of gait and mobility     Problem List Patient Active Problem List   Diagnosis Date Noted  . Morbid obesity ([place]) 06/10/2019  . Snoring 06/10/2019  . Excessive daytime sleepiness 06/10/2019  . Hyperlipidemia 12/19/2017  . Cholelithiasis and acute cholecystitis without obstruction 07/02/2017  . 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 Coronary atherosclerosis of native coronary artery 06/21/2013  . Essential hypertension, benign 06/21/2013  . Encounter for long-term (current) use of other medications 06/21/2013    Dorene Ar,  PTA 04/13/2020, 11:14 AM  [place], [place], [address] Phone: [phone]   Fax:  [phone]  Name: OSCAR FORMAN MRN: [id number] Date of Birth: 11-Jul-1950

## 2020-04-13 NOTE — Addendum Note (Signed)
Addended by: Theresia Majors on: 04/13/2020 08:29 AM   Modules accepted: Orders

## 2020-04-15 ENCOUNTER — Encounter: Payer: Self-pay | Admitting: Physical Therapy

## 2020-04-15 ENCOUNTER — Ambulatory Visit: Payer: Medicare Other | Admitting: Physical Therapy

## 2020-04-15 ENCOUNTER — Other Ambulatory Visit: Payer: Self-pay

## 2020-04-15 DIAGNOSIS — M6281 Muscle weakness (generalized): Secondary | ICD-10-CM | POA: Diagnosis not present

## 2020-04-15 DIAGNOSIS — R2681 Unsteadiness on feet: Secondary | ICD-10-CM

## 2020-04-15 DIAGNOSIS — R2689 Other abnormalities of gait and mobility: Secondary | ICD-10-CM

## 2020-04-15 NOTE — Therapy (Signed)
[place], [place], [address] Phone: [phone]   Fax:  [phone]  Physical Therapy Treatment  Patient Details  Name: Angela Morrison MRN: [id number] Date of Birth: 1950-10-29 Referring Provider (PT): Cari Caraway, MD   Encounter Date: 04/15/2020   PT End of Session - 04/15/20 0721    Visit Number 4    Number of Visits 13    Date for PT Re-Evaluation 05/05/20    Authorization Type MCR: kx mod at 15th visit, FOTO 6th and 10th visit.    PT Start Time [phone]    PT Stop Time 0800    PT Time Calculation (min) 44 min           Past Medical History:  Diagnosis Date  . Anemia    few yrs ago none recent  . Arthritis   . Constipation   . Coronary artery disease 2005   S/P PCI of RCA  . Depression   . Diabetes mellitus without complication ([place])    type 2  . Edema, lower extremity   . History of MI (myocardial infarction)   . Hyperlipidemia   . Hypertension   . Leg cramps   . Lower back pain   . Obesity   . PONV (postoperative nausea and vomiting)   . Shoulder pain   . Thyroid nodule    S/P bx 8/13 c/w goiter  . Vitamin D deficiency   . Weakness     Past Surgical History:  Procedure Laterality Date  . appendectomy Right   . BACK SURGERY     lower back  . CHOLECYSTECTOMY N/A 07/02/2017   Procedure: LAPAROSCOPIC CHOLECYSTECTOMY WITH INTRAOPERATIVE CHOLANGIOGRAM;  Surgeon: Excell Seltzer, MD;  [facility];  Service: General;  Laterality: N/A;  . COLONOSCOPY WITH PROPOFOL N/A 05/17/2016   Procedure: COLONOSCOPY WITH PROPOFOL;  Surgeon: Garlan Fair, MD;  [facility];  Service: Endoscopy;  Laterality: N/A;    There were no vitals filed for this visit.   Subjective Assessment - 04/15/20 0721    Subjective I am doing a few of the exercises , no pain.    Currently in Pain? No/denies                             [facility] Adult PT Treatment/Exercise -  04/15/20 0001      Neuro Re-ed    Neuro Re-ed Details  tandem trials with 3 sec best, tandem gait- with bilat touch, retro gait with bilateral touch, on airex pad- standard stance with eyes closed 5 sec best, added head turns on foam , narrow on foam trials Eyes open 5 sec best, marching for weight shift needs UE touch      Knee/Hip Exercises: Aerobic   Nustep L3 x 5 minutes      Knee/Hip Exercises: Standing   Heel Raises 20 reps    Heel Raises Limitations also toe raises    Hip Abduction 10 reps;2 sets      Knee/Hip Exercises: Seated   Long Arc Quad 10 reps;2 sets    Long Arc Quad Limitations red band    Marching 10 reps;2 sets    Marching Limitations red band    Hamstring Curl 10 reps;2 sets    Hamstring Limitations red band                    PT Short Term Goals - 04/10/20 8527  PT SHORT TERM GOAL #1   Title pt to be IND with inital HEP    Time 3    Period Weeks    Status On-going    Target Date 04/14/20      PT SHORT TERM GOAL #2   Title Assess BERG balance and update goals PRN    Baseline BERG 49/56    Time 1    Period Weeks    Status Partially Met    Target Date 03/31/20             PT Long Term Goals - 03/24/20 1043      PT LONG TERM GOAL #1   Title pt to increase gross LE strength to >/= 4+/5 to promote hip / knee stability    Time 6    Period Weeks    Status New    Target Date 05/05/20      PT LONG TERM GOAL #2   Title pt to increase walking/ standing to >/= 60 min for in home and community distances with no report of instability    Time 6    Period Weeks    Status New    Target Date 05/05/20      PT LONG TERM GOAL #3   Title reduce 5 x STS to and </= 12 seconds and Tug to </= 10 seconds for functional stability and mobility    Time 6    Period Weeks    Status New    Target Date 05/05/20      PT LONG TERM GOAL #4   Title increase FOTO Score to >/= 60% to demo improvement in function    Time 6    Period Weeks    Status New     Target Date 05/05/20      PT LONG TERM GOAL #5   Title pt to be IND with all HEP and is able to maintain and progress current LOF IND.    Time 6    Period Weeks    Status New    Target Date 05/05/20                 Plan - 04/15/20 0755    Clinical Impression Statement Pt arrives without pain and notes improved endurance with therex. She was able to tolerate 20 minutes of standing therex and balance in parallel bars without rest break. She reported feeling fatigue in her legs afterward.    PT Next Visit Plan review/ update HEP PRN, Set BERG goal, gross LE strengthening, gait training, pt doesn't tolerate laying down/ sidelying well    PT Home Exercise Plan RQYRPLMA - sit to stand, LAQ, seated march, standing hip abduction, standing heel raise, added rhomberg with head turns    Consulted and Agree with Plan of Care Patient           Patient will benefit from skilled therapeutic intervention in order to improve the following deficits and impairments:  Abnormal gait,Increased muscle spasms,Decreased endurance,Decreased balance  Visit Diagnosis: Muscle weakness (generalized)  Unsteadiness on feet  Other abnormalities of gait and mobility     Problem List Patient Active Problem List   Diagnosis Date Noted  . Morbid obesity ([place]) 06/10/2019  . Snoring 06/10/2019  . Excessive daytime sleepiness 06/10/2019  . Hyperlipidemia 12/19/2017  . Cholelithiasis and acute cholecystitis without obstruction 07/02/2017  . 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 Coronary atherosclerosis of native coronary artery 06/21/2013  . Essential hypertension, benign 06/21/2013  .  Encounter for long-term (current) use of other medications 06/21/2013    Dorene Ar, PTA 04/15/2020, 9:01 AM  [place], [place], [address] Phone: [phone]   Fax:  [phone]  Name: Angela Morrison MRN:  [id number] Date of Birth: [birth year]

## 2020-04-20 ENCOUNTER — Encounter: Payer: Self-pay | Admitting: Physical Therapy

## 2020-04-20 ENCOUNTER — Other Ambulatory Visit: Payer: Self-pay

## 2020-04-20 ENCOUNTER — Ambulatory Visit: Payer: Medicare Other | Admitting: Physical Therapy

## 2020-04-20 DIAGNOSIS — M6281 Muscle weakness (generalized): Secondary | ICD-10-CM | POA: Diagnosis not present

## 2020-04-20 DIAGNOSIS — R2681 Unsteadiness on feet: Secondary | ICD-10-CM

## 2020-04-20 DIAGNOSIS — R2689 Other abnormalities of gait and mobility: Secondary | ICD-10-CM

## 2020-04-20 NOTE — Therapy (Addendum)
[place], [place], [address] Phone: [phone]   Fax:  [phone]  Physical Therapy Treatment / discharge  Patient Details  Name: Angela Morrison MRN: [id number] Date of Birth: Jun 16, 1950 Referring Provider (PT): Cari Caraway, MD   Encounter Date: 04/20/2020   PT End of Session - 04/20/20 0836    Visit Number 5    Number of Visits 13    Date for PT Re-Evaluation 05/05/20    Authorization Type MCR: kx mod at 15th visit, FOTO 6th and 10th visit.    PT Start Time 0802    PT Stop Time 0848    PT Time Calculation (min) 46 min           Past Medical History:  Diagnosis Date  . Anemia    few yrs ago none recent  . Arthritis   . Constipation   . Coronary artery disease 2005   S/P PCI of RCA  . Depression   . Diabetes mellitus without complication ([place])    type 2  . Edema, lower extremity   . History of MI (myocardial infarction)   . Hyperlipidemia   . Hypertension   . Leg cramps   . Lower back pain   . Obesity   . PONV (postoperative nausea and vomiting)   . Shoulder pain   . Thyroid nodule    S/P bx 8/13 c/w goiter  . Vitamin D deficiency   . Weakness     Past Surgical History:  Procedure Laterality Date  . appendectomy Right   . BACK SURGERY     lower back  . CHOLECYSTECTOMY N/A 07/02/2017   Procedure: LAPAROSCOPIC CHOLECYSTECTOMY WITH INTRAOPERATIVE CHOLANGIOGRAM;  Surgeon: Excell Seltzer, MD;  [facility];  Service: General;  Laterality: N/A;  . COLONOSCOPY WITH PROPOFOL N/A 05/17/2016   Procedure: COLONOSCOPY WITH PROPOFOL;  Surgeon: Garlan Fair, MD;  [facility];  Service: Endoscopy;  Laterality: N/A;    There were no vitals filed for this visit.   Subjective Assessment - 04/20/20 0806    Subjective No pain. Doing well.    Currently in Pain? No/denies              [facility] PT Assessment - 04/20/20 0001      Standardized Balance Assessment    Five times sit to stand comments  22.4                         [facility] Adult PT Treatment/Exercise - 04/20/20 0001      Neuro Re-ed    Neuro Re-ed Details  tandem trials with 3 sec best, tandem gait- with bilat touch, retro gait with bilateral touch, on airex pad- standard stance with eyes closed 5 sec best, added head turns on foam , narrow on foam trials Eyes open 5 sec best, marching for weight shift needs UE touch      Knee/Hip Exercises: Aerobic   Nustep L3 x 5 minutes      Knee/Hip Exercises: Standing   Heel Raises 20 reps    Heel Raises Limitations also toe raises    Hip Flexion 10 reps    Hip Abduction 10 reps;2 sets      Knee/Hip Exercises: Seated   Long Arc Quad 10 reps;2 sets    Long Arc Quad Limitations Green band    Marching --    Marching Limitations --    Hamstring Curl 10 reps;2 sets  Hamstring Limitations Green band    Sit to [employer] 10 reps;2 sets                    PT Short Term Goals - 04/10/20 0810      PT SHORT TERM GOAL #1   Title pt to be IND with inital HEP    Time 3    Period Weeks    Status On-going    Target Date 04/14/20      PT SHORT TERM GOAL #2   Title Assess BERG balance and update goals PRN    Baseline BERG 49/56    Time 1    Period Weeks    Status Partially Met    Target Date 03/31/20             PT Long Term Goals - 03/24/20 1043      PT LONG TERM GOAL #1   Title pt to increase gross LE strength to >/= 4+/5 to promote hip / knee stability    Time 6    Period Weeks    Status New    Target Date 05/05/20      PT LONG TERM GOAL #2   Title pt to increase walking/ standing to >/= 60 min for in home and community distances with no report of instability    Time 6    Period Weeks    Status New    Target Date 05/05/20      PT LONG TERM GOAL #3   Title reduce 5 x STS to and </= 12 seconds and Tug to </= 10 seconds for functional stability and mobility    Time 6    Period Weeks    Status New    Target  Date 05/05/20      PT LONG TERM GOAL #4   Title increase FOTO Score to >/= 60% to demo improvement in function    Time 6    Period Weeks    Status New    Target Date 05/05/20      PT LONG TERM GOAL #5   Title pt to be IND with all HEP and is able to maintain and progress current LOF IND.    Time 6    Period Weeks    Status New    Target Date 05/05/20                 Plan - 04/20/20 0807    Clinical Impression Statement Pt reports she feels slightly stonger in her legs and feels her balance is improving. She notes improved ability to climb stairs at home. Continued with LE strength and balance. She requires UE touch with dynamic balance exercises. Progressed to green band for seated LE strength.    PT Next Visit Plan FOTO; Re-eval? 1 more visit scheduled, review/ update HEP PRN, Set BERG goal, gross LE strengthening, gait training, pt doesn't tolerate laying down/ sidelying well    PT Home Exercise Plan RQYRPLMA - sit to stand, LAQ, seated march, standing hip abduction, standing heel raise, added rhomberg with head turns           Patient will benefit from skilled therapeutic intervention in order to improve the following deficits and impairments:  Abnormal gait,Increased muscle spasms,Decreased endurance,Decreased balance  Visit Diagnosis: Muscle weakness (generalized)  Unsteadiness on feet  Other abnormalities of gait and mobility     Problem List Patient Active Problem List   Diagnosis Date Noted  . Morbid obesity (  [place]) 06/10/2019  . Snoring 06/10/2019  . Excessive daytime sleepiness 06/10/2019  . Hyperlipidemia 12/19/2017  . Cholelithiasis and acute cholecystitis without obstruction 07/02/2017  . 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 Coronary atherosclerosis of native coronary artery 06/21/2013  . Essential hypertension, benign 06/21/2013  . Encounter for long-term (current) use of other medications 06/21/2013    Dorene Ar ,  PTA 04/20/2020, 8:49 AM  [place], [place], [address] Phone: [phone]   Fax:  [phone]  Name: Angela Morrison MRN: [id number] Date of Birth: 12/05/1950      PHYSICAL THERAPY DISCHARGE SUMMARY  Visits from Start of Care: 5  Current functional level related to goals / functional outcomes: See goals   Remaining deficits: Current status unknown   Education / Equipment: HEP  Plan: Patient agrees to discharge.  Patient goals were not met. Patient is being discharged due to meeting the stated rehab goals.  ?????         Kristoffer Leamon PT, DPT, LAT, ATC  05/16/20  11:56 AM

## 2020-04-23 ENCOUNTER — Ambulatory Visit: Payer: Medicare Other | Admitting: Physical Therapy

## 2020-04-28 ENCOUNTER — Encounter (INDEPENDENT_AMBULATORY_CARE_PROVIDER_SITE_OTHER): Payer: Self-pay | Admitting: Family Medicine

## 2020-04-28 ENCOUNTER — Other Ambulatory Visit: Payer: Self-pay

## 2020-04-28 ENCOUNTER — Ambulatory Visit (INDEPENDENT_AMBULATORY_CARE_PROVIDER_SITE_OTHER): Payer: Medicare Other | Admitting: Family Medicine

## 2020-04-28 VITALS — BP 126/78 | HR 82 | Temp 98.0°F | Ht 64.0 in | Wt 231.0 lb

## 2020-04-28 DIAGNOSIS — I10 Essential (primary) hypertension: Secondary | ICD-10-CM

## 2020-04-28 DIAGNOSIS — E1165 Type 2 diabetes mellitus with hyperglycemia: Secondary | ICD-10-CM

## 2020-04-28 DIAGNOSIS — Z6839 Body mass index (BMI) 39.0-39.9, adult: Secondary | ICD-10-CM

## 2020-04-29 NOTE — Progress Notes (Signed)
Chief Complaint:   OBESITY Angela Morrison is here to discuss her progress with her obesity treatment plan along with follow-up of her obesity related diagnoses. Angela Morrison is on practicing portion control and making smarter food choices, such as increasing vegetables and decreasing simple carbohydrates and states she is following her eating plan approximately 50% of the time. Angela Morrison states she is walking 15 minutes 2 times per week.  Today's visit was #: 16 Starting weight: 265 lbs Starting date: 05/23/2019 Today's weight: 231 lbs Today's date: 04/28/2020 Total lbs lost to date: 34 lbs Total lbs lost since last in-office visit: 1 lb  Interim History: Angela Morrison started physical therapy last week and so she hasn't had much energy after that. She has been limiting her snacks/sweets and drinking more water. She is trying to make healthier meal decisions. She is limiting bread and soda. Pt wants to implement more physical activity.  Subjective:   1. Essential hypertension Angela Morrison's BP is controlled.  Pt denies chest pain, chest pressure and headache.  2. Type 2 diabetes mellitus with hyperglycemia, without long-term current use of insulin (HCC) Angela Morrison is scheduled to see Dr. Uvaldo Rising in the next few weeks. Her last A1c was 6.3. She is taking Jardiance 10 mg.  Assessment/Plan:   1. Essential hypertension Angela Morrison is working on healthy weight loss and exercise to improve blood pressure control. We will watch for signs of hypotension as she continues her lifestyle modifications. Continue HCTZ daily.  2. Type 2 diabetes mellitus with hyperglycemia, without long-term current use of insulin (HCC) Good blood sugar control is important to decrease the likelihood of diabetic complications such as nephropathy, neuropathy, limb loss, blindness, coronary artery disease, and death. Intensive lifestyle modification including diet, exercise and weight loss are the first line of treatment for  diabetes. Follow up with Dr. Uvaldo Rising regarding stopping Jardiance.  3. Class 2 severe obesity with serious comorbidity and body mass index (BMI) of 39.0 to 39.9 in adult, unspecified obesity type Angela Morrison) Angela Morrison is currently in the action stage of change. As such, her goal is to continue with weight loss efforts. She has agreed to practicing portion control and making smarter food choices, such as increasing vegetables and decreasing simple carbohydrates. Be mindful of soda, juice, and bread intake.  Exercise goals: All adults should avoid inactivity. Some physical activity is better than none, and adults who participate in any amount of physical activity gain some health benefits.  Behavioral modification strategies: increasing lean protein intake, meal planning and cooking strategies and keeping healthy foods in the home.  Angela Morrison has agreed to follow-up with our clinic in 3-4 weeks. She was informed of the importance of frequent follow-up visits to maximize her success with intensive lifestyle modifications for her multiple health conditions.   Objective:   Blood pressure 126/78, pulse 82, temperature 98 F (36.7 C), temperature source Oral, height 5\' 4"  (1.626 m), weight 231 lb (104.8 kg), SpO2 95 %. Body mass index is 39.65 kg/m.  General: Cooperative, alert, well developed, in no acute distress. HEENT: Conjunctivae and lids unremarkable. Cardiovascular: Regular rhythm.  Lungs: Normal work of breathing. Neurologic: No focal deficits.   Lab Results  Component Value Date   CREATININE 0.64 07/24/2019   BUN 8 07/24/2019   NA 140 07/24/2019   K 3.9 07/24/2019   CL 101 07/24/2019   CO2 22 07/24/2019   Lab Results  Component Value Date   ALT 17 04/13/2020   AST 14 08/21/2019   ALKPHOS 176 (H) 08/21/2019  BILITOT 1.2 08/21/2019   No results found for: HGBA1C No results found for: INSULIN No results found for: TSH Lab Results  Component Value Date   CHOL 160 04/13/2020    HDL 53 04/13/2020   LDLCALC 94 04/13/2020   TRIG 64 04/13/2020   CHOLHDL 3.0 04/13/2020   Lab Results  Component Value Date   WBC 8.7 06/25/2017   HGB 14.3 06/25/2017   HCT 44.0 06/25/2017   MCV 88.9 06/25/2017   PLT 255 06/25/2017    Attestation Statements:   Reviewed by clinician on day of visit: allergies, medications, problem list, medical history, surgical history, family history, social history, and previous encounter notes.  Time spent on visit including pre-visit chart review and post-visit care and charting was 15 minutes.   Edmund Hilda, am acting as transcriptionist for [name], MD.   I have reviewed the above documentation for accuracy and completeness, and I agree with the above. - Katherina Mires, MD

## 2020-04-30 ENCOUNTER — Ambulatory Visit: Payer: Medicare Other

## 2020-05-19 ENCOUNTER — Encounter (INDEPENDENT_AMBULATORY_CARE_PROVIDER_SITE_OTHER): Payer: Self-pay | Admitting: Family Medicine

## 2020-05-19 ENCOUNTER — Ambulatory Visit (INDEPENDENT_AMBULATORY_CARE_PROVIDER_SITE_OTHER): Payer: Medicare Other | Admitting: Family Medicine

## 2020-05-19 ENCOUNTER — Other Ambulatory Visit: Payer: Self-pay

## 2020-05-19 VITALS — BP 128/84 | HR 78 | Temp 98.2°F | Ht 64.0 in | Wt 230.0 lb

## 2020-05-19 DIAGNOSIS — Z6841 Body Mass Index (BMI) 40.0 and over, adult: Secondary | ICD-10-CM | POA: Diagnosis not present

## 2020-05-19 DIAGNOSIS — I25119 Atherosclerotic heart disease of native coronary artery with unspecified angina pectoris: Secondary | ICD-10-CM | POA: Diagnosis not present

## 2020-05-19 DIAGNOSIS — E118 Type 2 diabetes mellitus with unspecified complications: Secondary | ICD-10-CM | POA: Diagnosis not present

## 2020-05-20 ENCOUNTER — Encounter (INDEPENDENT_AMBULATORY_CARE_PROVIDER_SITE_OTHER): Payer: Self-pay | Admitting: Family Medicine

## 2020-05-20 DIAGNOSIS — E118 Type 2 diabetes mellitus with unspecified complications: Secondary | ICD-10-CM | POA: Insufficient documentation

## 2020-05-20 NOTE — Progress Notes (Signed)
Chief Complaint:   OBESITY Angela Morrison is here to discuss her progress with her obesity treatment plan along with follow-up of her obesity related diagnoses. Angela Morrison is on practicing portion control and making smarter food choices, such as increasing vegetables and decreasing simple carbohydrates and states she is following her eating plan approximately 50% of the time. Angela Morrison states she is walking 20 minutes 2-3 times per week.  Today's visit was #: 17 Starting weight: 265 lbs Starting date: 05/23/2019 Today's weight: 230 lbs Today's date: 05/19/2020 Total lbs lost to date: 35 lbs Total lbs lost since last in-office visit: 1  Interim History: Angela Morrison tends to skip breakfast. She says she eats a lot of pasta. She does have a salad at supper with meat. She has dessert every evening.  She tends to drink sweet tea and lemonade. She does not like the aftertaste of sweeteners. She notes she does not snack as much as she used to.   Subjective:   1. Type 2 diabetes mellitus with complication, without long-term current use of insulin (HCC) Dylan's A1c was 6.3 on 03/03/2020 at PCP office. She is on Jardiance. She does not check CBG's at home.   2. Atherosclerosis of native coronary artery of native heart with angina pectoris (HCC) Angela Morrison had an MI in 1996 and had a stent placed. She sees Dr. Carolanne Grumbling regularly. Her BP is well controlled.  Assessment/Plan:   1. Type 2 diabetes mellitus with complication, without long-term current use of insulin (HCC) . Continue Jardiance.  2. Atherosclerosis of native coronary artery of native heart with angina pectoris (HCC) Follow up with cardiology as directed.  3. Obesity: Current BMI 39 Angela Morrison is currently in the action stage of change. As such, her goal is to continue with weight loss efforts. She has agreed to practicing portion control and making smarter food choices, such as increasing vegetables and decreasing simple  carbohydrates + 75 grams protein.   Handout: Protein Content of Foods  Exercise goals: 20 minutes 4 times a week  Behavioral modification strategies: increasing lean protein intake and decreasing liquid calories. Discussed importance of protein intake.   Angela Morrison has agreed to follow-up with our clinic in 2-3 weeks with Dr. Lawson Radar.   Objective:   Blood pressure 128/84, pulse 78, temperature 98.2 F (36.8 C), height 5\' 4"  (1.626 m), weight 230 lb (104.3 kg), SpO2 97 %. Body mass index is 39.48 kg/m.  General: Cooperative, alert, well developed, in no acute distress. HEENT: Conjunctivae and lids unremarkable. Cardiovascular: Regular rhythm.  Lungs: Normal work of breathing. Neurologic: No focal deficits.   Lab Results  Component Value Date   CREATININE 0.64 07/24/2019   BUN 8 07/24/2019   NA 140 07/24/2019   K 3.9 07/24/2019   CL 101 07/24/2019   CO2 22 07/24/2019   Lab Results  Component Value Date   ALT 17 04/13/2020   AST 14 08/21/2019   ALKPHOS 176 (H) 08/21/2019   BILITOT 1.2 08/21/2019   No results found for: HGBA1C No results found for: INSULIN No results found for: TSH Lab Results  Component Value Date   CHOL 160 04/13/2020   HDL 53 04/13/2020   LDLCALC 94 04/13/2020   TRIG 64 04/13/2020   CHOLHDL 3.0 04/13/2020   Lab Results  Component Value Date   WBC 8.7 06/25/2017   HGB 14.3 06/25/2017   HCT 44.0 06/25/2017   MCV 88.9 06/25/2017   PLT 255 06/25/2017   No results found for: IRON, TIBC,  FERRITIN  Obesity Behavioral Intervention:   Approximately 15 minutes were spent on the discussion below.  ASK: We discussed the diagnosis of obesity with Lanora Manis today and Terria agreed to give [place] permission to discuss obesity behavioral modification therapy today.  ASSESS: Litha has the diagnosis of obesity and her BMI today is 39.6. Aishia is in the action stage of change.   ADVISE: Zienna was educated on the multiple health risks of  obesity as well as the benefit of weight loss to improve her health. She was advised of the need for long term treatment and the importance of lifestyle modifications to improve her current health and to decrease her risk of future health problems.  AGREE: Multiple dietary modification options and treatment options were discussed and Alyia agreed to follow the recommendations documented in the above note.  ARRANGE: Rhyse was educated on the importance of frequent visits to treat obesity as outlined per CMS and USPSTF guidelines and agreed to schedule her next follow up appointment today.  Attestation Statements:   Reviewed by clinician on day of visit: allergies, medications, problem list, medical history, surgical history, family history, social history, and previous encounter notes.  Edmund Hilda, am acting as Energy manager for [place], FNP.  I have reviewed the above documentation for accuracy and completeness, and I agree with the above. -  Jesse Sans, FNP

## 2020-06-03 ENCOUNTER — Ambulatory Visit (INDEPENDENT_AMBULATORY_CARE_PROVIDER_SITE_OTHER): Payer: Medicare Other | Admitting: Family Medicine

## 2020-06-22 ENCOUNTER — Other Ambulatory Visit: Payer: Self-pay | Admitting: Family Medicine

## 2020-06-22 DIAGNOSIS — R413 Other amnesia: Secondary | ICD-10-CM

## 2020-06-24 ENCOUNTER — Ambulatory Visit (INDEPENDENT_AMBULATORY_CARE_PROVIDER_SITE_OTHER): Payer: Medicare Other | Admitting: Family Medicine

## 2020-07-08 ENCOUNTER — Other Ambulatory Visit: Payer: Self-pay

## 2020-07-08 ENCOUNTER — Ambulatory Visit
Admission: RE | Admit: 2020-07-08 | Discharge: 2020-07-08 | Disposition: A | Discharge status: Home / Self Care | Payer: Medicare Other | Source: Ambulatory Visit | Attending: Family Medicine | Admitting: Family Medicine

## 2020-07-08 DIAGNOSIS — R413 Other amnesia: Secondary | ICD-10-CM

## 2020-07-09 ENCOUNTER — Other Ambulatory Visit: Payer: Medicare Other

## 2020-07-13 ENCOUNTER — Other Ambulatory Visit: Payer: Self-pay

## 2020-07-13 ENCOUNTER — Ambulatory Visit
Admission: RE | Admit: 2020-07-13 | Discharge: 2020-07-13 | Disposition: A | Discharge status: Home / Self Care | Payer: Medicare Other | Source: Ambulatory Visit | Attending: Family Medicine | Admitting: Family Medicine

## 2020-07-13 DIAGNOSIS — M85851 Other specified disorders of bone density and structure, right thigh: Secondary | ICD-10-CM

## 2020-09-16 ENCOUNTER — Ambulatory Visit: Payer: Medicare Other | Admitting: Neurology

## 2020-11-23 ENCOUNTER — Other Ambulatory Visit: Payer: Self-pay | Admitting: Physician Assistant

## 2020-11-23 DIAGNOSIS — E876 Hypokalemia: Secondary | ICD-10-CM

## 2020-12-14 ENCOUNTER — Other Ambulatory Visit: Payer: Self-pay | Admitting: Family Medicine

## 2020-12-14 DIAGNOSIS — Z1231 Encounter for screening mammogram for malignant neoplasm of breast: Secondary | ICD-10-CM

## 2020-12-17 ENCOUNTER — Telehealth: Payer: Self-pay | Admitting: Cardiology

## 2020-12-17 DIAGNOSIS — E7800 Pure hypercholesterolemia, unspecified: Secondary | ICD-10-CM

## 2020-12-17 NOTE — Telephone Encounter (Signed)
Left message for patient. Referral has been placed to the lipid clinic.

## 2020-12-17 NOTE — Telephone Encounter (Signed)
Barbara from  Dr. Mickie Kay office states the patient had labs and her LDL remains above goal, but is down to 78 now. She says the patient is taking Ezetimibe 10 mg and atorvastatin 80 mg. She states Dr. Uvaldo Rising would like to know whether Dr. Mayford Knife would like to refer the patient to the lipid clinic or is she is comfortable with where the labs are now. She says she also faxed over the labs. Phone:  [phone]

## 2021-01-04 ENCOUNTER — Ambulatory Visit (INDEPENDENT_AMBULATORY_CARE_PROVIDER_SITE_OTHER): Payer: Medicare Other | Admitting: Pharmacist

## 2021-01-04 ENCOUNTER — Other Ambulatory Visit: Payer: Self-pay

## 2021-01-04 DIAGNOSIS — E7800 Pure hypercholesterolemia, unspecified: Secondary | ICD-10-CM

## 2021-01-04 DIAGNOSIS — I25119 Atherosclerotic heart disease of native coronary artery with unspecified angina pectoris: Secondary | ICD-10-CM

## 2021-01-04 NOTE — Patient Instructions (Signed)
I will submit a prior authorization for Repatha or Praluent. I will also apply for a [employer] grant for you.  Focus on decreasing the amount of fast food and processed foods you eat. Try drinking more water and less sugary drinks  Please let me know if you have any questions- [phone]  Tips for living a healthier life     Building a Healthy and Balanced Diet Make most of your meal vegetables and fruits -  of your plate. Aim for color and variety, and remember that potatoes don't count as vegetables on the Healthy Eating Plate because of their negative impact on blood sugar.  Go for whole grains -  of your plate. Whole and intact grains--whole wheat, barley, wheat berries, quinoa, oats, brown rice, and foods made with them, such as whole wheat pasta--have a milder effect on blood sugar and insulin than white bread, white rice, and other refined grains.  Protein power -  of your plate. Fish, poultry, beans, and nuts are all healthy, versatile protein sources--they can be mixed into salads, and pair well with vegetables on a plate. Limit red meat, and avoid processed meats such as bacon and sausage.  Healthy plant oils - in moderation. Choose healthy vegetable oils like olive, canola, soy, corn, sunflower, peanut, and others, and avoid partially hydrogenated oils, which contain unhealthy trans fats. Remember that low-fat does not mean "healthy."  Drink water, coffee, or tea. Skip sugary drinks, limit milk and dairy products to one to two servings per day, and limit juice to a small glass per day.  Stay active. The red figure running across the Healthy Eating Plate's placemat is a reminder that staying active is also important in weight control.  The main message of the Healthy Eating Plate is to focus on diet quality:  The type of carbohydrate in the diet is more important than the amount of carbohydrate in the diet, because some sources of carbohydrate--like vegetables (other  than potatoes), fruits, whole grains, and beans--are healthier than others. The Healthy Eating Plate also advises consumers to avoid sugary beverages, a major source of calories--usually with little nutritional value--in the American diet. The Healthy Eating Plate encourages consumers to use healthy oils, and it does not set a maximum on the percentage of calories people should get each day from healthy sources of fat. In this way, the Healthy Eating Plate recommends the opposite of the low-fat message promoted for decades by the USDA.  [employer]  SUGAR  Sugar is a huge problem in the modern day diet. Sugar is a big contributor to heart disease, diabetes, high triglyceride levels, fatty liver disease and obesity. Sugar is hidden in almost all packaged foods/beverages. Added sugar is extra sugar that is added beyond what is naturally found and has no nutritional benefit for your body. The American Heart Association recommends limiting added sugars to no more than 25g for women and 36 grams for men per day. There are many names for sugar including maltose, sucrose (names ending in "ose"), high fructose corn syrup, molasses, cane sugar, corn sweetener, raw sugar, syrup, honey or fruit juice concentrate.   One of the best ways to limit your added sugars is to stop drinking sweetened beverages such as soda, sweet tea, and fruit juice.  There is 65g of added sugars in one 20oz bottle of Coke! That is equal to 7.5 donuts.   Pay attention and read all nutrition facts labels. Below is an examples of a nutrition facts label. The #1  is showing you the total sugars where the # 2 is showing you the added sugars. This one serving has almost the max amount of added sugars per day!     20 oz Soda 65g Sugar = 7.5 Glazed Donuts  16oz Energy  Drink 54g Sugar = 6.5 Glazed Donuts  Large Sweet  Tea 38g Sugar = 4 Glazed Donuts  20oz Sports  Drink 34g  Sugar = 3.5 Glazed Donuts  8oz Chocolate Milk 24g Sugar =2.5 Glazed Donuts  8oz Orange  Juice 21g Sugar = 2 Glazed Donuts  1 Juice Box 14g Sugar = 1.5 Glazed Donuts  16oz Water= NO SUGAR!!  EXERCISE  Exercise is good. We've all heard that. In an ideal world, we would all have time and resources to get plenty of it. When you are active, your heart pumps more efficiently and you will feel better.  Multiple studies show that even walking regularly has benefits that include living a longer life. The American Heart Association recommends 150 minutes per week of exercise (30 minutes per day most days of the week). You can do this in any increment you wish. Nine or more 10-minute walks count. So does an hour-long exercise class. Break the time apart into what will work in your life. Some of the best things you can do include walking briskly, jogging, cycling or swimming laps. Not everyone is ready to "exercise." Sometimes we need to start with just getting active. Here are some easy ways to be more active throughout the day:  Take the stairs instead of the elevator  Go for a 10-15 minute walk during your lunch break (find a friend to make it more enjoyable)  When shopping, park at the back of the parking lot  If you take public transportation, get off one stop early and walk the extra distance  Pace around while making phone calls  Check with your doctor if you aren't sure what your limitations may be. Always remember to drink plenty of water when doing any type of exercise. Don't feel like a failure if you're not getting the 90-150 minutes per week. If you started by being a couch potato, then just a 10-minute walk each day is a huge improvement. Start with little victories and work your way up.   HEALTHY EATING TIPS  When looking to improve your eating habits, whether to lose weight, lower blood pressure or just be healthier, it helps to know what a serving size is.   Grains 1 slice of  bread,  bagel,  cup pasta or rice  Vegetables 1 cup fresh or raw vegetables,  cup cooked or canned Fruits 1 piece of medium sized fruit,  cup canned,   Meats/Proteins  cup dried       1 oz meat, 1 egg,  cup cooked beans, nuts or seeds  Dairy        Fats Individual yogurt container, 1 cup (8oz)    1 teaspoon margarine/butter or vegetable  milk or milk alternative, 1 slice of cheese          oil; 1 tablespoon mayonnaise or salad dressing                  Plan ahead: make a menu of the meals for a week then create a grocery list to go with that menu. Consider meals that easily stretch into a night of leftovers, such as stews or casseroles. Or consider making two of your favorite meal and put one  in the freezer for another night. Try a night or two each week that is "meatless" or "no cook" such as salads. When you get home from the grocery store wash and prepare your vegetables and fruits. Then when you need them they are ready to go.   Tips for going to the grocery store:  Buy store or generic brands  Check the weekly ad from your store on-line or in their in-store flyer  Look at the unit price on the shelf tag to compare/contrast the costs of different items  Buy fruits/vegetables in season  Carrots, bananas and apples are low-cost, naturally healthy items  If meats or frozen vegetables are on sale, buy some extras and put in your freezer  Limit buying prepared or "ready to eat" items, even if they are pre-made salads or fruit snacks  Do not shop when you're hungry  Foods at eye level tend to be more expensive. Look on the high and low shelves for deals.  Consider shopping at the farmer's market for fresh foods in season.  Avoid the cookie and chip aisles (these are expensive, high in calories and low in nutritional value). Shop on the outside of the grocery store.  Healthy food preparations:  If you can't get lean hamburger, be sure to drain the fat when cooking  Steam, saut (in  olive oil), grill or bake foods  Experiment with different seasonings to avoid adding salt to your foods. Kosher salt, sea salt and Himalayan salt are all still salt and should be avoided. Try seasoning food with onion, garlic, thyme, rosemary, basil ect. Onion powder or garlic powder is ok. Avoid if it says salt (ie garlic salt).

## 2021-01-04 NOTE — Progress Notes (Signed)
Patient ID: Angela Morrison                 DOB: May 07, 1950                    MRN: [id number]     HPI: Angela Morrison is a 70 y.o. female patient referred to lipid clinic by Dr. Mayford Knife. PMH is significant for HTN, ASCAD s/p PCI of the RCA in 2005, DM and dyslipidemia. Her LDL-C was found to be 75. Her PCP reached out to Dr. Mayford Knife. Dr. Mayford Knife referred patient to lipid clinic to discuss PCSK9i.   Patient presents to clinic today. She states that she did not tolerate ezetimibe 10mg  daily. It made her stomach hurt. She is taking atorvastatin 80mg  daily. She was able to pay for her Jardiance, until she hit the donut whole. Now she is getting samples from PCP. Applied for pt assistance. Has not heard anything.  Diet is full of sugar, white flour, fast food and starches.  Current Medications: atorvastatin 80mg  daily  Intolerances: rosuvastatin (constipation), ezetimibe 10mg  daily (hurt her stomach) Risk Factors: MI, DM, HTN LDL goal: <55  Diet: breakfast: doesn't eat breakfast most of the time Lunch: hamburger and fries Dinner: husband cooks- pasta- sometimes Snack: crackers, chips Drink: peach tea  Exercise: very limited walking  Family History: The patient's family history includes Alcoholism in her father; Alzheimer's disease in her mother; Dementia in her father; Hypertension in her father and mother.  Social History:   Labs: 12/09/20: TC 139, HDL-C 48, LDL-C 75, TG 64 Past Medical History:  Diagnosis Date   Anemia    few yrs ago none recent   Arthritis    Constipation    Coronary artery disease 2005   S/P PCI of RCA   Depression    Diabetes mellitus without complication (HCC)    type 2   Edema, lower extremity    History of MI (myocardial infarction)    Hyperlipidemia    Hypertension    Leg cramps    Lower back pain    Obesity    PONV (postoperative nausea and vomiting)    Shoulder pain    Thyroid nodule    S/P bx 8/13 c/w goiter   Vitamin D deficiency     Weakness     Current Outpatient Medications on File Prior to Visit  Medication Sig Dispense Refill   aspirin EC 81 MG tablet Take 81 mg by mouth at bedtime.     atorvastatin (LIPITOR) 80 MG tablet TAKE ONE TABLET BY MOUTH EVERY MORNING 90 tablet 1   Cholecalciferol (VITAMIN D3) 2000 UNITS capsule Take 2,000 Units by mouth daily.      hydrochlorothiazide (HYDRODIURIL) 25 MG tablet TAKE ONE TABLET BY MOUTH EVERY MORNING 90 tablet 1   JARDIANCE 10 MG TABS tablet Take 10 mg by mouth daily.     Naproxen Sodium 220 MG CAPS 2 capsules with food or milk as needed     Oyster Shell Calcium 500 MG TABS 1 tablet with meals     Polyethyl Glycol-Propyl Glycol (SYSTANE) 0.4-0.3 % SOLN 1 drop into affected eye as needed     potassium chloride (KLOR-CON) 10 MEQ tablet TAKE ONE TABLET BY MOUTH EVERY MORNING 90 tablet 1   valsartan (DIOVAN) 80 MG tablet Take 80 mg by mouth daily.     No current facility-administered medications on file prior to visit.    Allergies  Allergen Reactions   Crestor [Rosuvastatin]  constipation   Morphine Sulfate Other (See Comments)   Penicillins Itching    Has patient had a PCN reaction causing immediate rash, facial/tongue/throat swelling, SOB or lightheadedness with hypotension: No Has patient had a PCN reaction causing severe rash involving mucus membranes or skin necrosis: No Has patient had a PCN reaction that required hospitalization No Has patient had a PCN reaction occurring within the last 10 years: No If all of the above answers are "NO", then may proceed with Cephalosporin use.    Assessment/Plan:  1. Hyperlipidemia - LDL is above goal of <55. We discussed PCSK9i side effects, CVRR, injection technique, LDL-C reduction and cost. Will submit a prior authorization for Repatha or Praluent. If approved, will also apply for healthwell grant. We discussed limiting fast food, starches and while flours like pasta and potatoes and drinking less sugary drinks.  Reviewed the amount of sugar in sweet tea. Encouraged her to increase her physical activity, try to make a routine of it and have someone hold her accountable. Also discussed some easy resistance training exercises.   Thank you,   Olene Floss, Pharm.D, BCPS, CPP [facility]  [address], [place], [place] [address]  Phone: [phone]; Fax: [phone]

## 2021-01-05 ENCOUNTER — Telehealth: Payer: Self-pay | Admitting: Pharmacist

## 2021-01-05 MED ORDER — REPATHA SURECLICK 140 MG/ML ~~LOC~~ SOAJ: 1.0000 "pen " | SUBCUTANEOUS | 11 refills | Status: DC

## 2021-01-05 NOTE — Telephone Encounter (Signed)
PA for Repatha approved through 07/04/21 [employer] grant approved  CARD NO. [id number]   BIN [id number]   PCN [id number]   PC [facility] [id number]  Pt was selected for income and diagnosis verification I have faxed over diagnosis verification Patient will drop off proof of income for [place] to fax to [employer]. Called information into upstream. Cannot process until documentation is submitted.

## 2021-01-14 ENCOUNTER — Ambulatory Visit
Admission: RE | Admit: 2021-01-14 | Discharge: 2021-01-14 | Disposition: A | Discharge status: Home / Self Care | Payer: Medicare Other | Source: Ambulatory Visit | Attending: Family Medicine | Admitting: Family Medicine

## 2021-01-14 DIAGNOSIS — Z1231 Encounter for screening mammogram for malignant neoplasm of breast: Secondary | ICD-10-CM

## 2021-06-21 ENCOUNTER — Other Ambulatory Visit: Payer: Self-pay | Admitting: Cardiology

## 2021-06-21 DIAGNOSIS — E876 Hypokalemia: Secondary | ICD-10-CM

## 2021-08-24 ENCOUNTER — Other Ambulatory Visit: Payer: Self-pay | Admitting: Cardiology

## 2021-08-24 DIAGNOSIS — E876 Hypokalemia: Secondary | ICD-10-CM

## 2021-09-01 ENCOUNTER — Other Ambulatory Visit: Payer: Self-pay | Admitting: Cardiology

## 2021-09-01 DIAGNOSIS — E876 Hypokalemia: Secondary | ICD-10-CM

## 2021-09-15 ENCOUNTER — Encounter (INDEPENDENT_AMBULATORY_CARE_PROVIDER_SITE_OTHER): Payer: Self-pay

## 2021-09-21 ENCOUNTER — Other Ambulatory Visit: Payer: Self-pay | Admitting: Cardiology

## 2021-09-21 DIAGNOSIS — E876 Hypokalemia: Secondary | ICD-10-CM

## 2021-11-01 ENCOUNTER — Other Ambulatory Visit: Payer: Self-pay | Admitting: Cardiology

## 2021-11-01 DIAGNOSIS — E876 Hypokalemia: Secondary | ICD-10-CM

## 2021-12-17 ENCOUNTER — Other Ambulatory Visit: Payer: Self-pay

## 2021-12-17 MED ORDER — HYDROCHLOROTHIAZIDE 25 MG PO TABS: 25.0000 mg | ORAL_TABLET | Freq: Every morning | ORAL | 0 refills | Status: DC

## 2022-02-16 ENCOUNTER — Other Ambulatory Visit (HOSPITAL_COMMUNITY): Payer: Self-pay

## 2023-06-06 ENCOUNTER — Encounter (HOSPITAL_COMMUNITY): Payer: Self-pay | Admitting: *Deleted

## 2023-06-06 ENCOUNTER — Other Ambulatory Visit: Payer: Self-pay

## 2023-06-06 ENCOUNTER — Emergency Department (HOSPITAL_COMMUNITY)
Admission: EM | Admit: 2023-06-06 | Discharge: 2023-06-07 | Disposition: A | Discharge status: Home / Self Care | Attending: Emergency Medicine | Admitting: Emergency Medicine

## 2023-06-06 DIAGNOSIS — I2510 Atherosclerotic heart disease of native coronary artery without angina pectoris: Secondary | ICD-10-CM | POA: Insufficient documentation

## 2023-06-06 DIAGNOSIS — I10 Essential (primary) hypertension: Secondary | ICD-10-CM | POA: Diagnosis not present

## 2023-06-06 DIAGNOSIS — E119 Type 2 diabetes mellitus without complications: Secondary | ICD-10-CM | POA: Insufficient documentation

## 2023-06-06 DIAGNOSIS — R109 Unspecified abdominal pain: Secondary | ICD-10-CM | POA: Insufficient documentation

## 2023-06-06 DIAGNOSIS — K5900 Constipation, unspecified: Secondary | ICD-10-CM | POA: Insufficient documentation

## 2023-06-06 DIAGNOSIS — R1110 Vomiting, unspecified: Secondary | ICD-10-CM | POA: Diagnosis not present

## 2023-06-06 LAB — CBC
HCT: 40.9 % (ref 36.0–46.0)
Hemoglobin: 13.1 g/dL (ref 12.0–15.0)
MCH: 28.9 pg (ref 26.0–34.0)
MCHC: 32 g/dL (ref 30.0–36.0)
MCV: 90.3 fL (ref 80.0–100.0)
Platelets: 261 10*3/uL (ref 150–400)
RBC: 4.53 MIL/uL (ref 3.87–5.11)
RDW: 13.9 % (ref 11.5–15.5)
WBC: 10.4 10*3/uL (ref 4.0–10.5)
nRBC: 0 % (ref 0.0–0.2)

## 2023-06-06 LAB — COMPREHENSIVE METABOLIC PANEL WITH GFR
ALT: 15 U/L (ref 0–44)
AST: 18 U/L (ref 15–41)
Albumin: 3.6 g/dL (ref 3.5–5.0)
Alkaline Phosphatase: 132 U/L — ABNORMAL HIGH (ref 38–126)
Anion gap: 12 (ref 5–15)
BUN: 9 mg/dL (ref 8–23)
CO2: 27 mmol/L (ref 22–32)
Calcium: 10.1 mg/dL (ref 8.9–10.3)
Chloride: 102 mmol/L (ref 98–111)
Creatinine, Ser: 0.89 mg/dL (ref 0.44–1.00)
GFR, Estimated: >60 mL/min (ref >60)
Glucose, Bld: 130 mg/dL — ABNORMAL HIGH (ref 70–99)
Potassium: 3.7 mmol/L (ref 3.5–5.1)
Sodium: 141 mmol/L (ref 135–145)
Total Bilirubin: 1.5 mg/dL — ABNORMAL HIGH (ref 0.0–1.2)
Total Protein: 7.7 g/dL (ref 6.5–8.1)

## 2023-06-06 LAB — LIPASE, BLOOD: Lipase: 25 U/L (ref 11–51)

## 2023-06-06 NOTE — ED Triage Notes (Addendum)
 Pt having abd pain for the past week with nausea, vomiting, constipation--now resolved, dizziness. Has not been taking medications at home, decreased PO intake. Hx of vascular dementia  Daughter at bedside requesting resources for assistance for patient. Pt recently with mental status change and living home with spouse.

## 2023-06-07 ENCOUNTER — Emergency Department (HOSPITAL_COMMUNITY)

## 2023-06-07 LAB — URINALYSIS, ROUTINE W REFLEX MICROSCOPIC
Bacteria, UA: NONE SEEN
Bilirubin Urine: NEGATIVE
Glucose, UA: NEGATIVE mg/dL
Hgb urine dipstick: NEGATIVE
Ketones, ur: 5 mg/dL — AB
Leukocytes,Ua: NEGATIVE
Nitrite: NEGATIVE
Protein, ur: 100 mg/dL — AB
Specific Gravity, Urine: 1.021 (ref 1.005–1.030)
pH: 6 (ref 5.0–8.0)

## 2023-06-07 MED ORDER — GOLYTELY 236 G PO SOLR: 4000.0000 mL | Freq: Once | ORAL | 0 refills | Status: AC

## 2023-06-07 NOTE — Discharge Instructions (Signed)
 You were evaluated in the Emergency Department and after careful evaluation, we did not find any emergent condition requiring admission or further testing in the hospital.  Your exam/testing today is overall reassuring.  Symptoms seem to be due to constipation.  Use the GoLytely solution as we discussed, follow-up with your regular doctor.  Please return to the Emergency Department if you experience any worsening of your condition.   Thank you for allowing us  to be a part of your care.

## 2023-06-07 NOTE — ED Provider Notes (Signed)
 [facility] Emergency Department Provider Note MRN:  [id number]  Arrival date & time: 06/07/23     Chief Complaint   Abdominal Pain   History of Present Illness   Angela Morrison is a 73 y.o. year-old female with a history of diabetes, hypertension presenting to the ED with chief complaint of abdominal pain.  Constipation for the past week now with worsening abdominal pain diffusely, episode of vomiting this evening.  Not really passing gas.  Has had similar constipation in the past but never with vomiting.  Review of Systems  A thorough review of systems was obtained and all systems are negative except as noted in the HPI and PMH.   Patient's Health History    Past Medical History:  Diagnosis Date   Anemia    few yrs ago none recent   Arthritis    Constipation    Coronary artery disease 2005   S/P PCI of RCA   Depression    Diabetes mellitus without complication (HCC)    type 2   Edema, lower extremity    History of MI (myocardial infarction)    Hyperlipidemia    Hypertension    Leg cramps    Lower back pain    Obesity    PONV (postoperative nausea and vomiting)    Shoulder pain    Thyroid  nodule    S/P bx 8/13 c/w goiter   Vitamin D deficiency    Weakness     Past Surgical History:  Procedure Laterality Date   appendectomy Right    BACK SURGERY     lower back   CHOLECYSTECTOMY N/A 07/02/2017   Procedure: LAPAROSCOPIC CHOLECYSTECTOMY WITH INTRAOPERATIVE CHOLANGIOGRAM;  Surgeon: Ayesha Lente, MD;  Location: [facility];  Service: General;  Laterality: N/A;   COLONOSCOPY WITH PROPOFOL  N/A 05/17/2016   Procedure: COLONOSCOPY WITH PROPOFOL ;  Surgeon: Garrett Kallman, MD;  Location: [facility] ENDOSCOPY;  Service: Endoscopy;  Laterality: N/A;    Family History  Problem Relation Age of Onset   Alzheimer's disease Mother    Hypertension Mother    Dementia Father    Hypertension Father    Alcoholism Father     Social History   Socioeconomic  History   Marital status: Married    Spouse name: Not on file   Number of children: 1   Years of education: Not on file   Highest education level: Not on file  Occupational History   Occupation: retired  Tobacco Use   Smoking status: Never   Smokeless tobacco: Never  Vaping Use   Vaping status: Never Used  Substance and Sexual Activity   Alcohol use: Yes    Comment: occ   Drug use: No   Sexual activity: Not on file  Other Topics Concern   Not on file  Social History Narrative   Not on file   Social Drivers of Health   Financial Resource Strain: Not on file  Food Insecurity: Not on file  Transportation Needs: Not on file  Physical Activity: Not on file  Stress: Not on file  Social Connections: Not on file  Intimate Partner Violence: Not on file     Physical Exam   Vitals:   06/07/23 0220 06/07/23 0524  BP: (!) 170/104 (!) 167/113  Pulse: 68 63  Resp: 14 16  Temp: 98.2 F (36.8 C) (!) 97.5 F (36.4 C)  SpO2: 99% 99%    CONSTITUTIONAL: Well-appearing, NAD NEURO/PSYCH:  Alert and oriented x 3, no focal deficits  EYES:  eyes equal and reactive ENT/NECK:  no LAD, no JVD CARDIO: Regular rate, well-perfused, normal S1 and S2 PULM:  CTAB no wheezing or rhonchi GI/GU:  non-distended, non-tender MSK/SPINE:  No gross deformities, no edema SKIN:  no rash, atraumatic   *Additional and/or pertinent findings included in MDM below  Diagnostic and Interventional Summary    EKG Interpretation Date/Time:    Ventricular Rate:    PR Interval:    QRS Duration:    QT Interval:    QTC Calculation:   R Axis:      Text Interpretation:         Labs Reviewed  COMPREHENSIVE METABOLIC PANEL WITH GFR - Abnormal; Notable for the following components:      Result Value   Glucose, Bld 130 (*)    Alkaline Phosphatase 132 (*)    Total Bilirubin 1.5 (*)    All other components within normal limits  URINALYSIS, ROUTINE W REFLEX MICROSCOPIC - Abnormal; Notable for the  following components:   APPearance HAZY (*)    Ketones, ur 5 (*)    Protein, ur 100 (*)    All other components within normal limits  LIPASE, BLOOD  CBC    CT ABDOMEN PELVIS WO CONTRAST  Final Result      Medications - No data to display   Procedures  /  Critical Care Procedures  ED Course and Medical Decision Making  Initial Impression and Ddx Differential diagnosis includes constipation, fecal impaction, bowel obstruction.  Awaiting CT.  Past medical/surgical history that increases complexity of ED encounter: Hypertension, dementia  Interpretation of Diagnostics I personally reviewed the Laboratory Testing and my interpretation is as follows: No significant blood count or electrolyte disturbance.  CT unremarkable  Patient Reassessment and Ultimate Disposition/Management     Discharge  Patient management required discussion with the following services or consulting groups:  None  Complexity of Problems Addressed Acute illness or injury that poses threat of life of bodily function  Additional Data Reviewed and Analyzed Further history obtained from: Further history from spouse/family member  Additional Factors Impacting ED Encounter Risk Prescriptions  Merrick Abe. Harless Lien, MD [facility] Emergency Medicine [facility] [email or web address]  Final Clinical Impressions(s) / ED Diagnoses     ICD-10-CM   1. Constipation, unspecified constipation type  K59.00       ED Discharge Orders          Ordered    polyethylene glycol (GOLYTELY) 236 g solution   Once        06/07/23 0537             Discharge Instructions Discussed with and Provided to Patient:     Discharge Instructions      You were evaluated in the Emergency Department and after careful evaluation, we did not find any emergent condition requiring admission or further testing in the hospital.  Your exam/testing today is overall reassuring.  Symptoms seem to be due to  constipation.  Use the GoLytely solution as we discussed, follow-up with your regular doctor.  Please return to the Emergency Department if you experience any worsening of your condition.   Thank you for allowing us  to be a part of your care.       Edson Graces, MD 06/07/23 [phone]

## 2023-10-26 ENCOUNTER — Ambulatory Visit: Admitting: Neurology

## 2023-11-07 ENCOUNTER — Ambulatory Visit: Admitting: Neurology

## 2023-11-07 ENCOUNTER — Encounter: Payer: Self-pay | Admitting: Neurology

## 2023-12-09 ENCOUNTER — Encounter (HOSPITAL_BASED_OUTPATIENT_CLINIC_OR_DEPARTMENT_OTHER): Payer: Self-pay | Admitting: Internal Medicine

## 2023-12-09 ENCOUNTER — Inpatient Hospital Stay (HOSPITAL_BASED_OUTPATIENT_CLINIC_OR_DEPARTMENT_OTHER)
Admission: EM | Admit: 2023-12-09 | Discharge: 2023-12-25 | DRG: 064 | Disposition: A | Discharge status: Skilled Nursing Facility | Attending: Internal Medicine | Admitting: Internal Medicine

## 2023-12-09 ENCOUNTER — Emergency Department (HOSPITAL_BASED_OUTPATIENT_CLINIC_OR_DEPARTMENT_OTHER)

## 2023-12-09 ENCOUNTER — Other Ambulatory Visit: Payer: Self-pay

## 2023-12-09 DIAGNOSIS — D72829 Elevated white blood cell count, unspecified: Secondary | ICD-10-CM | POA: Diagnosis present

## 2023-12-09 DIAGNOSIS — Z7984 Long term (current) use of oral hypoglycemic drugs: Secondary | ICD-10-CM

## 2023-12-09 DIAGNOSIS — E041 Nontoxic single thyroid nodule: Secondary | ICD-10-CM | POA: Diagnosis present

## 2023-12-09 DIAGNOSIS — I252 Old myocardial infarction: Secondary | ICD-10-CM

## 2023-12-09 DIAGNOSIS — I2510 Atherosclerotic heart disease of native coronary artery without angina pectoris: Secondary | ICD-10-CM | POA: Diagnosis present

## 2023-12-09 DIAGNOSIS — I10 Essential (primary) hypertension: Secondary | ICD-10-CM | POA: Diagnosis present

## 2023-12-09 DIAGNOSIS — E119 Type 2 diabetes mellitus without complications: Secondary | ICD-10-CM | POA: Diagnosis present

## 2023-12-09 DIAGNOSIS — F0150 Vascular dementia without behavioral disturbance: Secondary | ICD-10-CM | POA: Diagnosis present

## 2023-12-09 DIAGNOSIS — W19XXXA Unspecified fall, initial encounter: Secondary | ICD-10-CM | POA: Diagnosis present

## 2023-12-09 DIAGNOSIS — J189 Pneumonia, unspecified organism: Secondary | ICD-10-CM | POA: Diagnosis not present

## 2023-12-09 DIAGNOSIS — Z885 Allergy status to narcotic agent status: Secondary | ICD-10-CM

## 2023-12-09 DIAGNOSIS — I6389 Other cerebral infarction: Principal | ICD-10-CM | POA: Diagnosis present

## 2023-12-09 DIAGNOSIS — Z820 Family history of epilepsy and other diseases of the nervous system: Secondary | ICD-10-CM

## 2023-12-09 DIAGNOSIS — N3000 Acute cystitis without hematuria: Secondary | ICD-10-CM | POA: Diagnosis present

## 2023-12-09 DIAGNOSIS — Z9049 Acquired absence of other specified parts of digestive tract: Secondary | ICD-10-CM

## 2023-12-09 DIAGNOSIS — R531 Weakness: Secondary | ICD-10-CM

## 2023-12-09 DIAGNOSIS — R29810 Facial weakness: Secondary | ICD-10-CM | POA: Diagnosis present

## 2023-12-09 DIAGNOSIS — Z751 Person awaiting admission to adequate facility elsewhere: Secondary | ICD-10-CM

## 2023-12-09 DIAGNOSIS — J440 Chronic obstructive pulmonary disease with acute lower respiratory infection: Secondary | ICD-10-CM | POA: Diagnosis present

## 2023-12-09 DIAGNOSIS — Z811 Family history of alcohol abuse and dependence: Secondary | ICD-10-CM

## 2023-12-09 DIAGNOSIS — Z8249 Family history of ischemic heart disease and other diseases of the circulatory system: Secondary | ICD-10-CM

## 2023-12-09 DIAGNOSIS — Z79899 Other long term (current) drug therapy: Secondary | ICD-10-CM

## 2023-12-09 DIAGNOSIS — R4182 Altered mental status, unspecified: Principal | ICD-10-CM

## 2023-12-09 DIAGNOSIS — Z880 Allergy status to penicillin: Secondary | ICD-10-CM

## 2023-12-09 DIAGNOSIS — G9340 Encephalopathy, unspecified: Secondary | ICD-10-CM | POA: Diagnosis present

## 2023-12-09 DIAGNOSIS — R221 Localized swelling, mass and lump, neck: Secondary | ICD-10-CM | POA: Diagnosis not present

## 2023-12-09 DIAGNOSIS — Z7982 Long term (current) use of aspirin: Secondary | ICD-10-CM

## 2023-12-09 DIAGNOSIS — R296 Repeated falls: Secondary | ICD-10-CM | POA: Diagnosis present

## 2023-12-09 DIAGNOSIS — Z9181 History of falling: Secondary | ICD-10-CM

## 2023-12-09 DIAGNOSIS — R5383 Other fatigue: Secondary | ICD-10-CM

## 2023-12-09 DIAGNOSIS — I639 Cerebral infarction, unspecified: Secondary | ICD-10-CM

## 2023-12-09 DIAGNOSIS — Z888 Allergy status to other drugs, medicaments and biological substances status: Secondary | ICD-10-CM

## 2023-12-09 DIAGNOSIS — R630 Anorexia: Secondary | ICD-10-CM | POA: Diagnosis present

## 2023-12-09 DIAGNOSIS — Q892 Congenital malformations of other endocrine glands: Secondary | ICD-10-CM

## 2023-12-09 DIAGNOSIS — E785 Hyperlipidemia, unspecified: Secondary | ICD-10-CM | POA: Diagnosis present

## 2023-12-09 DIAGNOSIS — G9341 Metabolic encephalopathy: Secondary | ICD-10-CM | POA: Diagnosis present

## 2023-12-09 DIAGNOSIS — Z9861 Coronary angioplasty status: Secondary | ICD-10-CM

## 2023-12-09 DIAGNOSIS — R29706 NIHSS score 6: Secondary | ICD-10-CM | POA: Diagnosis present

## 2023-12-09 LAB — TSH: TSH: 0.848 u[IU]/mL (ref 0.350–4.500)

## 2023-12-09 LAB — CBC WITH DIFFERENTIAL/PLATELET
Abs Immature Granulocytes: 0.05 10*3/uL (ref 0.00–0.07)
Basophils Absolute: 0 10*3/uL (ref 0.0–0.1)
Basophils Relative: 0 %
Eosinophils Absolute: 0.2 10*3/uL (ref 0.0–0.5)
Eosinophils Relative: 1 %
HCT: 38.6 % (ref 36.0–46.0)
Hemoglobin: 12.4 g/dL (ref 12.0–15.0)
Immature Granulocytes: 0 %
Lymphocytes Relative: 14 %
Lymphs Abs: 1.7 10*3/uL (ref 0.7–4.0)
MCH: 28.8 pg (ref 26.0–34.0)
MCHC: 32.1 g/dL (ref 30.0–36.0)
MCV: 89.8 fL (ref 80.0–100.0)
Monocytes Absolute: 1.3 10*3/uL — ABNORMAL HIGH (ref 0.1–1.0)
Monocytes Relative: 10 %
Neutro Abs: 9.1 10*3/uL — ABNORMAL HIGH (ref 1.7–7.7)
Neutrophils Relative %: 75 %
Platelets: 221 10*3/uL (ref 150–400)
RBC: 4.3 MIL/uL (ref 3.87–5.11)
RDW: 15.5 % (ref 11.5–15.5)
WBC: 12.2 10*3/uL — ABNORMAL HIGH (ref 4.0–10.5)
nRBC: 0 % (ref 0.0–0.2)

## 2023-12-09 LAB — URINALYSIS, W/ REFLEX TO CULTURE (INFECTION SUSPECTED)
Bilirubin Urine: NEGATIVE
Glucose, UA: NEGATIVE mg/dL
Hgb urine dipstick: NEGATIVE
Ketones, ur: NEGATIVE mg/dL
Nitrite: NEGATIVE
Protein, ur: NEGATIVE mg/dL
Specific Gravity, Urine: 1.018 (ref 1.005–1.030)
pH: 6 (ref 5.0–8.0)

## 2023-12-09 LAB — COMPREHENSIVE METABOLIC PANEL WITH GFR
ALT: 16 U/L (ref 0–44)
AST: 40 U/L (ref 15–41)
Albumin: 4 g/dL (ref 3.5–5.0)
Alkaline Phosphatase: 162 U/L — ABNORMAL HIGH (ref 38–126)
Anion gap: 10 (ref 5–15)
BUN: 18 mg/dL (ref 8–23)
CO2: 27 mmol/L (ref 22–32)
Calcium: 10.7 mg/dL — ABNORMAL HIGH (ref 8.9–10.3)
Chloride: 104 mmol/L (ref 98–111)
Creatinine, Ser: 1.03 mg/dL — ABNORMAL HIGH (ref 0.44–1.00)
GFR, Estimated: 57 mL/min — ABNORMAL LOW
Glucose, Bld: 133 mg/dL — ABNORMAL HIGH (ref 70–99)
Potassium: 4.4 mmol/L (ref 3.5–5.1)
Sodium: 141 mmol/L (ref 135–145)
Total Bilirubin: 0.9 mg/dL (ref 0.0–1.2)
Total Protein: 7.8 g/dL (ref 6.5–8.1)

## 2023-12-09 LAB — MAGNESIUM: Magnesium: 1.8 mg/dL (ref 1.7–2.4)

## 2023-12-09 LAB — CBG MONITORING, ED: Glucose-Capillary: 128 mg/dL — ABNORMAL HIGH (ref 70–99)

## 2023-12-09 LAB — T4, FREE: Free T4: 1.12 ng/dL (ref 0.61–1.12)

## 2023-12-09 LAB — TROPONIN T, HIGH SENSITIVITY: Troponin T High Sensitivity: 20 ng/L — ABNORMAL HIGH (ref 0–19)

## 2023-12-09 MED ORDER — SODIUM CHLORIDE 0.9 % IV BOLUS
500.0000 mL | Freq: Once | INTRAVENOUS | Status: AC
  Administered 2023-12-09: 500 mL via INTRAVENOUS

## 2023-12-09 MED ORDER — LORAZEPAM 0.5 MG PO TABS: 0.5000 mg | ORAL_TABLET | ORAL | Status: DC | PRN

## 2023-12-09 NOTE — ED Triage Notes (Signed)
 Pt arrived with daughter, c/o unwitnessed 4 falls in last 24 hours. No obvious injuries. No blood thinners. Denies any LOC. C/o L foot weakness and swelling.  Hx vascular dementia x3years.

## 2023-12-09 NOTE — ED Provider Notes (Addendum)
 [place] EMERGENCY DEPARTMENT AT [facility] Provider Note   CSN: [id number] Arrival date & time: 12/09/23  1453     Patient presents with: Fall and Weakness   Angela Morrison is a 73 y.o. female.    Fall  Weakness    73 year old female with medical history significant for HTN, HLD, CAD, obesity, DM2, depression, arthritis, vitamin D deficiency, dementia presenting to the emergency department with a chief complaint of multiple falls, inability to care for herself in the home as well as weakness.  The patient has had at least 4 unwitnessed falls in the last 24 hours.  The patient's daughter states that her stepfather may have witnessed a few of the falls.  Not on anticoagulation, no clear loss of consciousness.  She has been dragging her foot for an unknown period of time.  She denies any pain on arrival.  Unclear last normal per the patient's daughter, possibly this past September.  No obvious injuries.  She states that the patient is confused compared to her baseline.  She has a history of vascular dementia.  She arrives GCS 14, AAO x 2.  Prior to Admission medications   Medication Sig Start Date End Date Taking? Authorizing Provider  aspirin  EC 81 MG tablet Take 81 mg by mouth at bedtime.    [provider]  atorvastatin  (LIPITOR) 80 MG tablet TAKE ONE TABLET BY MOUTH EVERY MORNING 06/22/21   Shlomo Wilbert SAUNDERS, MD  Cholecalciferol (VITAMIN D3) 2000 UNITS capsule Take 2,000 Units by mouth daily.     [provider]  Evolocumab  (REPATHA  SURECLICK) 140 MG/ML SOAJ Inject 1 pen into the skin every 14 (fourteen) days. 01/05/21   Shlomo Wilbert SAUNDERS, MD  hydrochlorothiazide  (HYDRODIURIL ) 25 MG tablet Take 1 tablet (25 mg total) by mouth every morning. 12/17/21   Shlomo Wilbert SAUNDERS, MD  JARDIANCE 10 MG TABS tablet Take 10 mg by mouth daily. 03/05/20   [provider]  Naproxen Sodium 220 MG CAPS 2 capsules with food or milk as needed 12/17/18   [provider]  Allie Gutta Calcium  500 MG TABS 1 tablet with meals    [provider]  Polyethyl Glycol-Propyl Glycol (SYSTANE) 0.4-0.3 % SOLN 1 drop into affected eye as needed    [provider]  potassium chloride  (KLOR-CON ) 10 MEQ tablet TAKE ONE TABLET BY MOUTH every morning 11/02/21   Hobart Powell BRAVO, MD  valsartan (DIOVAN) 80 MG tablet Take 80 mg by mouth daily. 03/05/20   [provider]    Allergies: Crestor [rosuvastatin], Morphine  sulfate, and Penicillins    Review of Systems  Neurological:  Positive for weakness.  All other systems reviewed and are negative.   Updated Vital Signs BP (!) 178/98   Pulse 99   Temp 98.2 F (36.8 C) (Oral)   Resp 17   SpO2 97%   Physical Exam Vitals and nursing note reviewed.  Constitutional:      General: She is not in acute distress.    Appearance: She is well-developed.     Comments: GCS 14, ABC intact  HENT:     Head: Normocephalic and atraumatic.  Eyes:     Extraocular Movements: Extraocular movements intact.     Conjunctiva/sclera: Conjunctivae normal.     Pupils: Pupils are equal, round, and reactive to light.  Neck:     Comments: No midline tenderness to palpation of the cervical spine.  Range of motion intact Cardiovascular:     Rate and Rhythm:  Normal rate and regular rhythm.     Heart sounds: No murmur heard. Pulmonary:     Effort: Pulmonary effort is normal. No respiratory distress.     Breath sounds: Normal breath sounds.  Chest:     Comments: Clavicles stable nontender to AP compression.  Chest wall stable and nontender to AP and lateral compression. Abdominal:     Palpations: Abdomen is soft.     Tenderness: There is no abdominal tenderness.     Comments: Pelvis stable to lateral compression  Musculoskeletal:     Cervical back: Neck supple.     Comments: No midline tenderness to palpation of the thoracic or lumbar spine.  Extremities atraumatic with intact range of motion   Skin:    General: Skin is warm and dry.  Neurological:     Mental Status: She is alert.     Comments: Cranial nerves II through XII grossly intact.  Moving all 4 extremities spontaneously.  Sensation grossly intact all 4 extremities, appears to be weak with 4 out of 5 strength in the left lower extremity.     (all labs ordered are listed, but only abnormal results are displayed) Labs Reviewed  CBC WITH DIFFERENTIAL/PLATELET - Abnormal; Notable for the following components:      Result Value   WBC 12.2 (*)    Neutro Abs 9.1 (*)    Monocytes Absolute 1.3 (*)    All other components within normal limits  COMPREHENSIVE METABOLIC PANEL WITH GFR - Abnormal; Notable for the following components:   Glucose, Bld 133 (*)    Creatinine, Ser 1.03 (*)    Calcium  10.7 (*)    Alkaline Phosphatase 162 (*)    GFR, Estimated 57 (*)    All other components within normal limits  URINALYSIS, W/ REFLEX TO CULTURE (INFECTION SUSPECTED) - Abnormal; Notable for the following components:   Leukocytes,Ua SMALL (*)    Bacteria, UA RARE (*)    All other components within normal limits  CBG MONITORING, ED - Abnormal; Notable for the following components:   Glucose-Capillary 128 (*)    All other components within normal limits  TROPONIN T, HIGH SENSITIVITY - Abnormal; Notable for the following components:   Troponin T High Sensitivity 20 (*)    All other components within normal limits  MAGNESIUM  TSH  T4, FREE    EKG: EKG Interpretation Date/Time:  Saturday December 09 2023 15:34:00 EDT Ventricular Rate:  95 PR Interval:  153 QRS Duration:  141 QT Interval:  380 QTC Calculation: 478 R Axis:   -44  Text Interpretation: Sinus rhythm IVCD, consider atypical RBBB Left ventricular hypertrophy Confirmed by Jerrol Agent ([id number]) on 12/09/2023 3:40:31 PM  Radiology: CT Head Wo Contrast Result Date: 12/09/2023 EXAM: CT HEAD WITHOUT CONTRAST 12/09/2023 04:47:28 PM TECHNIQUE: CT of the head was performed  without the administration of intravenous contrast. Automated exposure control, iterative reconstruction, and/or weight based adjustment of the mA/kV was utilized to reduce the radiation dose to as low as reasonably achievable. COMPARISON: None available. CLINICAL HISTORY: Head trauma, minor (Age >= 65y). FINDINGS: BRAIN AND VENTRICLES: No acute hemorrhage. No evidence of acute infarct. 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 Left cerebellar infarct. Extensive periventricular and subcortical white matter low-density changes compatible with chronic microvascular ischemic change. Mild diffuse cerebral volume loss. Cavum septum pellucidum et vergae. No hydrocephalus. No extra-axial collection. No mass effect or midline shift. ORBITS: No acute abnormality. SINUSES: No acute abnormality. SOFT TISSUES AND SKULL: No acute soft tissue abnormality.  No skull fracture. Atherosclerotic calcifications in large vessels of skull base. IMPRESSION: 1. No acute intracranial abnormality. 2. 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 3. Extensive periventricular and subcortical white matter changes compatible with chronic microvascular ischemic change. Electronically signed by: Franky Stanford MD 12/09/2023 05:01 PM EDT RP Workstation: [id number]   CT Cervical Spine Wo Contrast Result Date: 12/09/2023 EXAM: CT CERVICAL SPINE WITHOUT CONTRAST 12/09/2023 04:47:28 PM TECHNIQUE: CT of the cervical spine was performed without the administration of intravenous contrast. Multiplanar reformatted images are provided for review. Automated exposure control, iterative reconstruction, and/or weight based adjustment of the mA/kV was utilized to reduce the radiation dose to as low as reasonably achievable. COMPARISON: None available. CLINICAL HISTORY: Neck trauma (Age >= 65y) FINDINGS: CERVICAL SPINE: BONES AND ALIGNMENT: No acute fracture or traumatic malalignment. DEGENERATIVE CHANGES: No significant  degenerative changes. 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 This causes mild mass effect on the airway, but the airway remains widely patent. Non-erupted central maxillary incisors. Edentulous oral cavity. IMPRESSION: 1. 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 MRI of the neck with and without contrast recommended for further evaluation. 2. No acute abnormality of the cervical spine. Electronically signed by: Franky Stanford MD 12/09/2023 05:00 PM EDT RP Workstation: [id number]   DG Chest Portable 1 View Result Date: 12/09/2023 CLINICAL DATA:  Weakness, multiple falls. EXAM: PORTABLE CHEST 1 VIEW, PORTABLE PELVIS COMPARISON:  08/16/2011. FINDINGS: Chest: The heart size and mediastinal contours are within normal limits. Lung volumes are low. No consolidation, effusion, or pneumothorax is seen. Surgical clips are present in the right upper quadrant. No acute osseous abnormality. Pelvis: There is no evidence of acute fracture or dislocation. Mild degenerative changes are noted at the hips bilaterally. Vascular calcifications are seen in the pelvis and bilateral lower extremities. IMPRESSION: 1. No active disease. 2. No acute fracture or dislocation at the pelvis. Electronically Signed   By: Leita Birmingham M.D.   On: 12/09/2023 16:32   DG Pelvis Portable Result Date: 12/09/2023 CLINICAL DATA:  Weakness, multiple falls. EXAM: PORTABLE CHEST 1 VIEW, PORTABLE PELVIS COMPARISON:  08/16/2011. FINDINGS: Chest: The heart size and mediastinal contours are within normal limits. Lung volumes are low. No consolidation, effusion, or pneumothorax is seen. Surgical clips are present in the right upper quadrant. No acute osseous abnormality. Pelvis: There is no evidence of acute fracture or dislocation. Mild  degenerative changes are noted at the hips bilaterally. Vascular calcifications are seen in the pelvis and bilateral lower extremities. IMPRESSION: 1. No active disease. 2. No acute fracture or dislocation at the pelvis. Electronically Signed   By: Leita Birmingham M.D.   On: 12/09/2023 16:32     Procedures   Medications Ordered in the ED  LORazepam (ATIVAN) tablet 0.5 mg (has no administration in time range)  sodium chloride  0.9 % bolus 500 mL (0 mLs Intravenous Stopped 12/09/23 1727)  sodium chloride  0.9 % bolus 500 mL (0 mLs Intravenous Stopped 12/09/23 1909)                                    Medical Decision Making Amount and/or Complexity of Data  Reviewed Labs: ordered. Radiology: ordered.  Risk Prescription drug management. Decision regarding hospitalization.     73 year old female with medical history significant for HTN, HLD, CAD, obesity, DM2, depression, arthritis, vitamin D deficiency, dementia presenting to the emergency department with a chief complaint of multiple falls, inability to care for herself in the home as well as weakness.  The patient has had at least 4 unwitnessed falls in the last 24 hours.  The patient's daughter states that her stepfather may have witnessed a few of the falls.  Not on anticoagulation, no clear loss of consciousness.  She has been dragging her foot for an unknown period of time.  She denies any pain on arrival.  Unclear last normal per the patient's daughter, possibly this past September.  No obvious injuries.  She states that the patient is confused compared to her baseline.  She has a history of vascular dementia.  She arrives GCS 14, AAO x 2.  On arrival, the patient was afebrile, not tachycardic or tachypneic, BP 108/80, saturating 90% on room air.  On exam, the patient was alert and oriented x 2, GCS 14, with left lower extremity weakness on exam.  On trauma exam the patient had no tenderness to palpation of the bony prominences of her spine,  no other evidence of trauma on exam.  Patient presents with altered mental status as well as weakness in left lower extremity with unclear last known baseline.  Considered CVA.  Considered electrolyte abnormality, other toxic metabolic derangement.  Medical Decision Making:    Initial Assessment:   With the patient's presentation of altered mental status, most likely diagnosis is delerium 2/2 infectious etiology (UTI/CAP/URI) vs metabolic abnormality (Na/K/Mg/Ca) vs nonspecific etiology. Other diagnoses were considered including (but not limited to) CVA, ICH, intracranial mass, critical dehydration, heptatic dysfunction, uremia, hypercarbia, intoxication, endrocrine abnormality, toxidrome. These are considered less likely due to history of present illness and physical exam findings.  This is most consistent with an acute life/limb threatening illness complicated by underlying chronic conditions.  Initial Plan:  CTH and Cervical Spine WO to evaluate for intracranial etiology of patient's symptoms  Screening labs including CBC and Metabolic panel to evaluate for infectious or metabolic etiology of disease.  Urinalysis with reflex culture ordered to evaluate for UTI or relevant urologic/nephrologic pathology.  CXR to evaluate for structural/infectious intrathoracic pathology.  TSH for evaluation for endrocrine etiology Drug screen for toxidrome evaluation VBG for acid/base status and further toxidrome evaulation EKG to evaluate for cardiac pathology Objective evaluation as below reviewed   Initial Study Results:   Laboratory  All laboratory results reviewed without evidence of clinically relevant pathology.   Exceptions include: Nonspecific leukocytosis to 12.2, normal LFTs, creatinine 1.03, urinalysis collected with small leukocytes, 0-5 WBCs and rare bacteria, in the setting of a PureWick collection this is unconvincing for UTI.  EKG EKG was reviewed independently. Rate, rhythm, axis,  intervals all examined and without medically relevant abnormality. ST segments without concerns for elevations.    Radiology:  All images reviewed independently. Agree with radiology report at this time.   CT Head Wo Contrast Result Date: 12/09/2023 EXAM: CT HEAD WITHOUT CONTRAST 12/09/2023 04:47:28 PM TECHNIQUE: CT of the head was performed without the administration of intravenous contrast. Automated exposure control, iterative reconstruction, and/or weight based adjustment of the mA/kV was utilized to reduce the radiation dose to as low as reasonably achievable. COMPARISON: None available. CLINICAL HISTORY: Head trauma, minor (Age >= 65y). FINDINGS: BRAIN AND VENTRICLES: No acute hemorrhage.  No evidence of acute infarct. 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 Left cerebellar infarct. Extensive periventricular and subcortical white matter low-density changes compatible with chronic microvascular ischemic change. Mild diffuse cerebral volume loss. Cavum septum pellucidum et vergae. No hydrocephalus. No extra-axial collection. No mass effect or midline shift. ORBITS: No acute abnormality. SINUSES: No acute abnormality. SOFT TISSUES AND SKULL: No acute soft tissue abnormality. No skull fracture. Atherosclerotic calcifications in large vessels of skull base. IMPRESSION: 1. No acute intracranial abnormality. 2. 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 3. Extensive periventricular and subcortical white matter changes compatible with chronic microvascular ischemic change. Electronically signed by: Franky Stanford MD 12/09/2023 05:01 PM EDT RP Workstation: [id number]   CT Cervical Spine Wo Contrast Result Date: 12/09/2023 EXAM: CT CERVICAL SPINE WITHOUT CONTRAST 12/09/2023 04:47:28 PM TECHNIQUE: CT of the cervical spine was performed without the administration of intravenous contrast. Multiplanar reformatted images are provided for review. Automated exposure control, iterative  reconstruction, and/or weight based adjustment of the mA/kV was utilized to reduce the radiation dose to as low as reasonably achievable. COMPARISON: None available. CLINICAL HISTORY: Neck trauma (Age >= 65y) FINDINGS: CERVICAL SPINE: BONES AND ALIGNMENT: No acute fracture or traumatic malalignment. DEGENERATIVE CHANGES: No significant degenerative changes. 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 This causes mild mass effect on the airway, but the airway remains widely patent. Non-erupted central maxillary incisors. Edentulous oral cavity. IMPRESSION: 1. 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 MRI of the neck with and without contrast recommended for further evaluation. 2. No acute abnormality of the cervical spine. Electronically signed by: Franky Stanford MD 12/09/2023 05:00 PM EDT RP Workstation: [id number]   DG Chest Portable 1 View Result Date: 12/09/2023 CLINICAL DATA:  Weakness, multiple falls. EXAM: PORTABLE CHEST 1 VIEW, PORTABLE PELVIS COMPARISON:  08/16/2011. FINDINGS: Chest: The heart size and mediastinal contours are within normal limits. Lung volumes are low. No consolidation, effusion, or pneumothorax is seen. Surgical clips are present in the right upper quadrant. No acute osseous abnormality. Pelvis: There is no evidence of acute fracture or dislocation. Mild degenerative changes are noted at the hips bilaterally. Vascular calcifications are seen in the pelvis and bilateral lower extremities. IMPRESSION: 1. No active disease. 2. No acute fracture or dislocation at the pelvis. Electronically Signed   By: Leita Birmingham M.D.   On: 12/09/2023 16:32   DG Pelvis Portable Result Date: 12/09/2023 CLINICAL DATA:  Weakness, multiple falls. EXAM: PORTABLE CHEST 1 VIEW, PORTABLE PELVIS  COMPARISON:  08/16/2011. FINDINGS: Chest: The heart size and mediastinal contours are within normal limits. Lung volumes are low. No consolidation, effusion, or pneumothorax is seen. Surgical clips are present in the right upper quadrant. No acute osseous abnormality. Pelvis: There is no evidence of acute fracture or dislocation. Mild degenerative changes are noted at the hips bilaterally. Vascular calcifications are seen in the pelvis and bilateral lower extremities. IMPRESSION: 1. No active disease. 2. No acute fracture or dislocation at the pelvis. Electronically Signed   By: Leita Birmingham M.D.   On: 12/09/2023 16:32      Consults: Case discussed with ENT, Dr. Anice, who will see in consultation in the AM.  Patient without hoarse voice, high potato voice, no  stridor.  Likely not abscess.  Final Assessment and Plan:   MRI imaging of the brain obtained in the setting of the patient's left lower extremity weakness.  The patient was found to have a neck mass on CT, MRI of the neck with and without contrast was recommended for further evaluation.  MRI brain and MRI neck ordered, patient unable to obtain MRI here at [facility] per staff, unable to get up and stand out of bed due to weakness.  In the setting of this, medicine consulted for admission, Dr. Marcene accepting.  Patient unable to care for herself in the home, would benefit from PT/OT consult      Final diagnoses:  Altered mental status, unspecified altered mental status type  Fatigue, unspecified type  Falls  Neck mass  Weakness    ED Discharge Orders     None          Jerrol Agent, MD 12/09/23 2236    Jerrol Agent, MD 12/09/23 2236

## 2023-12-09 NOTE — Progress Notes (Signed)
 Hospitalist Transfer Note:    Nursing staff, Please call [facility] Admits & Consults System-Wide number on Amion [phone]) as soon as patient's arrival, so appropriate admitting provider can evaluate the pt.  Transferring facility: [facility] Requesting provider: Arthor Captain, PA (EDP at [facility]) Reason for transfer: admission for further evaluation and management of acute diverticulitis in the setting of failed outpatient antibiotics.     73 year old female  who presented to [facility] ED complaining of 3 weeks of progressive lower abdominal discomfort.  Approximately 1 week ago he had presented to his PCP with complaints of lower abdominal discomfort, at which time PCP was able to arrange for outpatient CT abdomen/pelvis which was suggestive of acute diverticulitis.  The patient subsequently completed a course of Cipro/Flagyl, but is noted further ensuing progression of his lower abdominal discomfort, prompting him to present to [facility] this evening.  Vital signs in the ED were notable for the following: Afebrile, heart rates in the 60s to 80s; systolic blood pressures in the 110s to 130s mmHg.   Imaging in the ED today notable for CT abdomen/pelvis with contrast, which showed acute diverticulitis without evidence of obstruction, perforation, or abscess.  Medications administered prior to transfer included the following: Zosyn   Subsequently, I accepted this patient for transfer for inpatient admission to a med/tele bed at [facility] or [facility]  (first available) for further work-up and management of the above.        Newton Pigg, DO Hospitalist

## 2023-12-09 NOTE — ED Notes (Signed)
 Patient had an episode of urinary incontinence, patient cleaned and linens changed.

## 2023-12-09 NOTE — ED Notes (Signed)
-  Patients daughter Kariss Longmire is going home for the evening to get some rest, requested to be called if anything changes with the patient or when she gets a bed. She will be back in the AM. Phone number listed on file & verified correct.

## 2023-12-10 ENCOUNTER — Inpatient Hospital Stay (HOSPITAL_COMMUNITY)

## 2023-12-10 DIAGNOSIS — Z9049 Acquired absence of other specified parts of digestive tract: Secondary | ICD-10-CM | POA: Diagnosis not present

## 2023-12-10 DIAGNOSIS — J189 Pneumonia, unspecified organism: Secondary | ICD-10-CM | POA: Diagnosis not present

## 2023-12-10 DIAGNOSIS — G9340 Encephalopathy, unspecified: Secondary | ICD-10-CM

## 2023-12-10 DIAGNOSIS — I639 Cerebral infarction, unspecified: Secondary | ICD-10-CM | POA: Diagnosis not present

## 2023-12-10 DIAGNOSIS — D72829 Elevated white blood cell count, unspecified: Secondary | ICD-10-CM | POA: Diagnosis present

## 2023-12-10 DIAGNOSIS — R4182 Altered mental status, unspecified: Secondary | ICD-10-CM | POA: Diagnosis not present

## 2023-12-10 DIAGNOSIS — F0150 Vascular dementia without behavioral disturbance: Secondary | ICD-10-CM | POA: Diagnosis present

## 2023-12-10 DIAGNOSIS — R221 Localized swelling, mass and lump, neck: Secondary | ICD-10-CM | POA: Diagnosis present

## 2023-12-10 DIAGNOSIS — R296 Repeated falls: Secondary | ICD-10-CM | POA: Diagnosis present

## 2023-12-10 DIAGNOSIS — R531 Weakness: Secondary | ICD-10-CM | POA: Diagnosis not present

## 2023-12-10 DIAGNOSIS — I739 Peripheral vascular disease, unspecified: Secondary | ICD-10-CM | POA: Diagnosis not present

## 2023-12-10 DIAGNOSIS — Z880 Allergy status to penicillin: Secondary | ICD-10-CM | POA: Diagnosis not present

## 2023-12-10 DIAGNOSIS — Z885 Allergy status to narcotic agent status: Secondary | ICD-10-CM | POA: Diagnosis not present

## 2023-12-10 DIAGNOSIS — E041 Nontoxic single thyroid nodule: Secondary | ICD-10-CM | POA: Diagnosis present

## 2023-12-10 DIAGNOSIS — R29706 NIHSS score 6: Secondary | ICD-10-CM | POA: Diagnosis present

## 2023-12-10 DIAGNOSIS — I10 Essential (primary) hypertension: Secondary | ICD-10-CM | POA: Diagnosis present

## 2023-12-10 DIAGNOSIS — Z9861 Coronary angioplasty status: Secondary | ICD-10-CM | POA: Diagnosis not present

## 2023-12-10 DIAGNOSIS — I252 Old myocardial infarction: Secondary | ICD-10-CM | POA: Diagnosis not present

## 2023-12-10 DIAGNOSIS — W19XXXA Unspecified fall, initial encounter: Secondary | ICD-10-CM | POA: Diagnosis present

## 2023-12-10 DIAGNOSIS — N3000 Acute cystitis without hematuria: Secondary | ICD-10-CM | POA: Diagnosis present

## 2023-12-10 DIAGNOSIS — E785 Hyperlipidemia, unspecified: Secondary | ICD-10-CM | POA: Diagnosis present

## 2023-12-10 DIAGNOSIS — I6340 Cerebral infarction due to embolism of unspecified cerebral artery: Secondary | ICD-10-CM | POA: Diagnosis not present

## 2023-12-10 DIAGNOSIS — Z7982 Long term (current) use of aspirin: Secondary | ICD-10-CM | POA: Diagnosis not present

## 2023-12-10 DIAGNOSIS — G9341 Metabolic encephalopathy: Secondary | ICD-10-CM | POA: Diagnosis present

## 2023-12-10 DIAGNOSIS — R5383 Other fatigue: Secondary | ICD-10-CM | POA: Diagnosis not present

## 2023-12-10 DIAGNOSIS — I2510 Atherosclerotic heart disease of native coronary artery without angina pectoris: Secondary | ICD-10-CM | POA: Diagnosis present

## 2023-12-10 DIAGNOSIS — Z8249 Family history of ischemic heart disease and other diseases of the circulatory system: Secondary | ICD-10-CM | POA: Diagnosis not present

## 2023-12-10 DIAGNOSIS — I6389 Other cerebral infarction: Secondary | ICD-10-CM | POA: Diagnosis present

## 2023-12-10 DIAGNOSIS — Z7984 Long term (current) use of oral hypoglycemic drugs: Secondary | ICD-10-CM | POA: Diagnosis not present

## 2023-12-10 DIAGNOSIS — E119 Type 2 diabetes mellitus without complications: Secondary | ICD-10-CM | POA: Diagnosis present

## 2023-12-10 DIAGNOSIS — F0390 Unspecified dementia without behavioral disturbance: Secondary | ICD-10-CM | POA: Diagnosis not present

## 2023-12-10 DIAGNOSIS — J440 Chronic obstructive pulmonary disease with acute lower respiratory infection: Secondary | ICD-10-CM | POA: Diagnosis present

## 2023-12-10 MED ORDER — IRBESARTAN 75 MG PO TABS
75.0000 mg | ORAL_TABLET | Freq: Every day | ORAL | Status: DC
  Administered 2023-12-10 – 2023-12-20 (×11): 75 mg via ORAL
  Filled 2023-12-10 (×11): qty 1

## 2023-12-10 MED ORDER — OXYMETAZOLINE HCL 0.05 % NA SOLN
3.0000 | Freq: Once | NASAL | Status: AC
  Administered 2023-12-10: 3 via NASAL
  Filled 2023-12-10: qty 30

## 2023-12-10 MED ORDER — EZETIMIBE 10 MG PO TABS
10.0000 mg | ORAL_TABLET | Freq: Every day | ORAL | Status: DC
  Administered 2023-12-10 – 2023-12-25 (×16): 10 mg via ORAL
  Filled 2023-12-10 (×16): qty 1

## 2023-12-10 MED ORDER — ATORVASTATIN CALCIUM 80 MG PO TABS
80.0000 mg | ORAL_TABLET | Freq: Every morning | ORAL | Status: DC
  Administered 2023-12-10 – 2023-12-25 (×16): 80 mg via ORAL
  Filled 2023-12-10 (×17): qty 1

## 2023-12-10 MED ORDER — GADOBUTROL 1 MMOL/ML IV SOLN
9.0000 mL | Freq: Once | INTRAVENOUS | Status: AC | PRN
Start: 2023-12-10 — End: 2023-12-10
  Administered 2023-12-10: 9 mL via INTRAVENOUS

## 2023-12-10 MED ORDER — SODIUM CHLORIDE 0.9 % IV SOLN
1.0000 g | INTRAVENOUS | Status: AC
  Administered 2023-12-10 – 2023-12-14 (×5): 1 g via INTRAVENOUS
  Filled 2023-12-10 (×5): qty 10

## 2023-12-10 MED ORDER — ACETAMINOPHEN 650 MG RE SUPP: 650.0000 mg | Freq: Four times a day (QID) | RECTAL | Status: DC | PRN

## 2023-12-10 MED ORDER — ACETAMINOPHEN 325 MG PO TABS
650.0000 mg | ORAL_TABLET | Freq: Four times a day (QID) | ORAL | Status: DC | PRN
  Administered 2023-12-12 – 2023-12-25 (×6): 650 mg via ORAL
  Filled 2023-12-10 (×6): qty 2

## 2023-12-10 MED ORDER — IPRATROPIUM-ALBUTEROL 0.5-2.5 (3) MG/3ML IN SOLN: 3.0000 mL | Freq: Four times a day (QID) | RESPIRATORY_TRACT | Status: DC | PRN

## 2023-12-10 NOTE — ED Notes (Signed)
-  Called [facility] for transportation to [facility].

## 2023-12-10 NOTE — ED Notes (Signed)
 Pt en route to [facility] w/ [facility] at this time

## 2023-12-10 NOTE — Consult Note (Addendum)
 Reason for Consult:neck mass Referring Physician: Dr. Jerrol Almarie Angela Morrison is an 73 y.o. female.  HPI:  Angela Morrison is a 73 y.o. female with medical history significant of HTN, COPD, dementia, who presented to [facility] ED for evaluation of altered mental status relative to her baseline dementia c/b multiple mechanical GLFs, as noted by the patient's family over the last 2 to 3 days.    Pt is a limited historian. From what I can gather from Epic review and brief interview, pt here w/ worsening confusion and multiple falls.   Per overnight provider, Family also conveys that the patient has had multiple falls over the last 1 to 2 days. No recent fever. Labs notable for WBC 12.2.  CT cervical spine without contrast showed a nonspecific neck mass.     Past Medical History:  Diagnosis Date   Anemia    few yrs ago none recent   Arthritis    Constipation    Coronary artery disease 2005   S/P PCI of RCA   Depression    Diabetes mellitus without complication (HCC)    type 2   Edema, lower extremity    History of MI (myocardial infarction)    Hyperlipidemia    Hypertension    Leg cramps    Lower back pain    Obesity    PONV (postoperative nausea and vomiting)    Shoulder pain    Thyroid  nodule    S/P bx 8/13 c/w goiter   Vitamin D deficiency    Weakness     Past Surgical History:  Procedure Laterality Date   appendectomy Right    BACK SURGERY     lower back   CHOLECYSTECTOMY N/A 07/02/2017   Procedure: LAPAROSCOPIC CHOLECYSTECTOMY WITH INTRAOPERATIVE CHOLANGIOGRAM;  Surgeon: Mikell Katz, MD;  Location: [facility] ORS;  Service: General;  Laterality: N/A;   COLONOSCOPY WITH PROPOFOL  N/A 05/17/2016   Procedure: COLONOSCOPY WITH PROPOFOL ;  Surgeon: Gladis MARLA Louder, MD;  Location: [facility] ENDOS[facility];  Service: Endoscopy;  Laterality: N/A;    Family History  Problem Relation Age of Onset   Alzheimer's disease Mother    Hypertension Mother    Dementia Father    Hypertension Father     Alcoholism Father     Social History:  reports that she has never smoked. She has never used smokeless tobacco. She reports current alcohol use. She reports that she does not use drugs.  Allergies:  Allergies  Allergen Reactions   Crestor [Rosuvastatin]     constipation   Morphine  Sulfate Other (See Comments)   Penicillins Itching    Results for orders placed or performed during the hospital encounter of 12/09/23 (from the past 48 hours)  CBC with Differential     Status: Abnormal   Collection Time: 12/09/23  3:46 PM  Result Value Ref Range   WBC 12.2 (H) 4.0 - 10.5 K/uL   RBC 4.30 3.87 - 5.11 MIL/uL   Hemoglobin 12.4 12.0 - 15.0 g/dL   HCT 61.3 63.9 - 53.9 %   MCV 89.8 80.0 - 100.0 fL   MCH 28.8 26.0 - 34.0 pg   MCHC 32.1 30.0 - 36.0 g/dL   RDW 84.4 88.4 - 84.4 %   Platelets 221 150 - 400 K/uL   nRBC 0.0 0.0 - 0.2 %   Neutrophils Relative % 75 %   Neutro Abs 9.1 (H) 1.7 - 7.7 K/uL   Lymphocytes Relative 14 %   Lymphs Abs 1.7 0.7 - 4.0 K/uL  Monocytes Relative 10 %   Monocytes Absolute 1.3 (H) 0.1 - 1.0 K/uL   Eosinophils Relative 1 %   Eosinophils Absolute 0.2 0.0 - 0.5 K/uL   Basophils Relative 0 %   Basophils Absolute 0.0 0.0 - 0.1 K/uL   Immature Granulocytes 0 %   Abs Immature Granulocytes 0.05 0.00 - 0.07 K/uL    Comment: Performed at [facility], [address] [place], [place] [address]  Comprehensive metabolic panel     Status: Abnormal   Collection Time: 12/09/23  3:46 PM  Result Value Ref Range   Sodium 141 135 - 145 mmol/L   Potassium 4.4 3.5 - 5.1 mmol/L    Comment: HEMOLYSIS AT THIS LEVEL MAY AFFECT RESULT   Chloride 104 98 - 111 mmol/L   CO2 27 22 - 32 mmol/L   Glucose, Bld 133 (H) 70 - 99 mg/dL    Comment: Glucose reference range applies only to samples taken after fasting for at least 8 hours.   BUN 18 8 - 23 mg/dL   Creatinine, Ser 8.96 (H) 0.44 - 1.00 mg/dL   Calcium  10.7 (H) 8.9 - 10.3 mg/dL   Total Protein 7.8 6.5 -  8.1 g/dL   Albumin 4.0 3.5 - 5.0 g/dL   AST 40 15 - 41 U/L    Comment: HEMOLYSIS AT THIS LEVEL MAY AFFECT RESULT   ALT 16 0 - 44 U/L   Alkaline Phosphatase 162 (H) 38 - 126 U/L   Total Bilirubin 0.9 0.0 - 1.2 mg/dL   GFR, Estimated 57 (L) >60 mL/min    Comment: (NOTE) Calculated using the CKD-EPI Creatinine Equation (2021)    Anion gap 10 5 - 15    Comment: Performed at [facility], [address], [place], [place] [address]  Magnesium     Status: None   Collection Time: 12/09/23  3:46 PM  Result Value Ref Range   Magnesium 1.8 1.7 - 2.4 mg/dL    Comment: Performed at [facility], [address] [place], [place] [address]  Troponin T, High Sensitivity     Status: Abnormal   Collection Time: 12/09/23  3:46 PM  Result Value Ref Range   Troponin T High Sensitivity 20 (H) 0 - 19 ng/L    Comment: (NOTE) Biotin concentrations > 1000 ng/mL falsely decrease TnT results.  Serial cardiac troponin measurements are suggested.  Refer to the Links section for chest pain algorithms and additional  guidance. Performed at [facility], [address] [place], [place] [address]   TSH     Status: None   Collection Time: 12/09/23  3:46 PM  Result Value Ref Range   TSH 0.848 0.350 - 4.500 uIU/mL    Comment: Performed at [facility], [address] [place], [place] [address]  POC CBG, ED     Status: Abnormal   Collection Time: 12/09/23  3:52 PM  Result Value Ref Range   Glucose-Capillary 128 (H) 70 - 99 mg/dL    Comment: Glucose reference range applies only to samples taken after fasting for at least 8 hours.  T4, free     Status: None   Collection Time: 12/09/23  6:43 PM  Result Value Ref Range   Free T4 1.12 0.61 - 1.12 ng/dL    Comment: (NOTE) Biotin ingestion may interfere with free T4 tests. If the results are inconsistent with the TSH level, previous test results, or the clinical presentation, then  consider biotin interference. If needed, order  repeat testing after stopping biotin. Performed at [facility], [address] [place], [place] [address]   Urinalysis, w/ Reflex to Culture (Infection Suspected) -Urine, Clean Catch     Status: Abnormal   Collection Time: 12/09/23  9:38 PM  Result Value Ref Range   Specimen Source URINE, CLEAN CATCH    Color, Urine YELLOW YELLOW   APPearance CLEAR CLEAR   Specific Gravity, Urine 1.018 1.005 - 1.030   pH 6.0 5.0 - 8.0   Glucose, UA NEGATIVE NEGATIVE mg/dL   Hgb urine dipstick NEGATIVE NEGATIVE   Bilirubin Urine NEGATIVE NEGATIVE   Ketones, ur NEGATIVE NEGATIVE mg/dL   Protein, ur NEGATIVE NEGATIVE mg/dL   Nitrite NEGATIVE NEGATIVE   Leukocytes,Ua SMALL (A) NEGATIVE   RBC / HPF 0-5 0 - 5 RBC/hpf   WBC, UA 0-5 0 - 5 WBC/hpf    Comment:        Reflex urine culture not performed if WBC <=10, OR if Squamous epithelial cells >5. If Squamous epithelial cells >5 suggest recollection.    Bacteria, UA RARE (A) NONE SEEN   Squamous Epithelial / HPF 0-5 0 - 5 /HPF    Comment: Performed at [facility], [address], [place], [place] [address]    MR Neck Soft Tissue Only W or Wo Contrast Result Date: 12/10/2023 EXAM: MR NECK WITH AND WITHOUT INTRAVENOUS CONTRAST 12/10/2023 10:17:00 AM TECHNIQUE: Multiplanar multisequence MRI of the neck was performed with and without the administration of 9 mL gadobutrol (GADAVIST) 1 MMOL/ML injection. COMPARISON: CT cervical spine 12/09/2023. CLINICAL HISTORY: Neck mass, nonpulsatile. FINDINGS: LIMITATIONS: The examination is mildly to moderately motion degraded. PHARYNX AND LARYNX: As shown on today's CT, there is a mass deep to the hyoid bone which measures 3.1 x 1.8 x 1.9 cm. The mass demonstrates uniform mild to moderate T2 hyperintensity, intrinsic T1 hyperintensity, and no convincing enhancement. The mass extends across the midline bilaterally but is eccentric to the left with  mild mass effect on the supraglottic larynx. The midline component of the mass is intimately associated with the hyoid bone and extends between the hyoid bone and thyroid  cartilage. No associated bone or cartilage destruction is evident. No mucosal mass is identified. There is no parapharyngeal or retropharyngeal fluid collection or inflammation. SALIVARY GLANDS: The parotid and submandibular glands appear unremarkable. THYROID : Bilateral thyroid  nodules including a dominant left lower pole nodule measuring at least 2 cm in diameter, previously evaluated by ultrasound. LYMPH NODES: No cervical or supraclavicular lymphadenopathy is seen. SOFT TISSUES: No appreciable soft tissue swelling or other mass is seen. BRAIN, ORBITS AND SINUSES: Brain reported separately. The visualized portion of the orbits demonstrate no acute abnormality. Mild right ethmoid sinus mucosal thickening. Clear mastoid air cells. BONES: Mild cervical spondylosis. No suspicious marrow lesion. IMPRESSION: 1. 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 Electronically signed by: Dasie Hamburg MD 12/10/2023 10:44 AM EST RP Workstation: [id number]   MR BRAIN W WO CONTRAST Result Date: 12/10/2023 EXAM: MRI BRAIN WITH AND WITHOUT CONTRAST 12/10/2023 10:16:57 AM TECHNIQUE: Multiplanar multisequence MRI of the head/brain was performed with and without the administration of 9 mL gadobutrol (GADAVIST) 1 MMOL/ML injection. COMPARISON: Head CT 12/20/2023 and MRI 07/19/2020. CLINICAL HISTORY: Neuro deficit, acute, stroke suspected. Multiple recent falls. FINDINGS: The study is mildly motion degraded. BRAIN AND VENTRICLES: There is a subcentimeter acute infarct in the posterior limb of the right internal capsule. A subcentimeter focus of restricted diffusion and  associated enhancement in the left external capsule likely reflects a subacute infarct. There is also a punctate focus of mild trace diffusion weighted signal  hyperintensity with normal ADC in the left occipital white matter which may also reflect a subacute infarct. Chronic microhemorrhages are present in the left thalamus and cerebellum. Confluent T2 hyperintensities in the cerebral white matter bilaterally have progressed from the prior MRI and are nonspecific but compatible with severe chronic small vessel ischemic disease. Chronic lacunar infarcts are again seen in the basal ganglia, thalami, pons, and cerebellum. There is mild cerebral atrophy. A normal variant cavum septum pellucidum et verge is noted. No mass, midline shift, hydrocephalus, or extra axial fluid collection is identified. Major intracranial vascular flow voids are preserved. ORBITS: No acute abnormality. SINUSES: Moderate right ethmoid sinus mucosal thickening. Clear mastoid air cells. BONES AND SOFT TISSUES: Normal bone marrow signal and enhancement. Neck reported separately. IMPRESSION: 1. 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 2. Likely subacute infarcts in the left external capsule and left occipital white matter. 3. Severe chronic small vessel ischemic disease. Electronically signed by: Dasie Hamburg MD 12/10/2023 10:30 AM EST RP Workstation: [id number]   CT Head Wo Contrast Result Date: 12/09/2023 EXAM: CT HEAD WITHOUT CONTRAST 12/09/2023 04:47:28 PM TECHNIQUE: CT of the head was performed without the administration of intravenous contrast. Automated exposure control, iterative reconstruction, and/or weight based adjustment of the mA/kV was utilized to reduce the radiation dose to as low as reasonably achievable. COMPARISON: None available. CLINICAL HISTORY: Head trauma, minor (Age >= 65y). FINDINGS: BRAIN AND VENTRICLES: No acute hemorrhage. No evidence of acute infarct. 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 Left cerebellar infarct. Extensive periventricular and subcortical white matter low-density changes compatible with chronic microvascular  ischemic change. Mild diffuse cerebral volume loss. Cavum septum pellucidum et vergae. No hydrocephalus. No extra-axial collection. No mass effect or midline shift. ORBITS: No acute abnormality. SINUSES: No acute abnormality. SOFT TISSUES AND SKULL: No acute soft tissue abnormality. No skull fracture. Atherosclerotic calcifications in large vessels of skull base. IMPRESSION: 1. No acute intracranial abnormality. 2. 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 3. Extensive periventricular and subcortical white matter changes compatible with chronic microvascular ischemic change. Electronically signed by: Franky Stanford MD 12/09/2023 05:01 PM EDT RP Workstation: [id number]   CT Cervical Spine Wo Contrast Result Date: 12/09/2023 EXAM: CT CERVICAL SPINE WITHOUT CONTRAST 12/09/2023 04:47:28 PM TECHNIQUE: CT of the cervical spine was performed without the administration of intravenous contrast. Multiplanar reformatted images are provided for review. Automated exposure control, iterative reconstruction, and/or weight based adjustment of the mA/kV was utilized to reduce the radiation dose to as low as reasonably achievable. COMPARISON: None available. CLINICAL HISTORY: Neck trauma (Age >= 65y) FINDINGS: CERVICAL SPINE: BONES AND ALIGNMENT: No acute fracture or traumatic malalignment. DEGENERATIVE CHANGES: No significant degenerative changes. 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 This causes mild mass effect on the airway, but the airway remains widely patent. Non-erupted central maxillary incisors. Edentulous oral cavity. IMPRESSION: 1. 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 MRI of the neck with and without contrast recommended for further evaluation. 2. No acute  abnormality of the cervical spine. Electronically signed by: Franky  Alexa MD 12/09/2023 05:00 PM EDT RP Workstation: [id number]   DG Chest Portable 1 View Result Date: 12/09/2023 CLINICAL DATA:  Weakness, multiple falls. EXAM: PORTABLE CHEST 1 VIEW, PORTABLE PELVIS COMPARISON:  08/16/2011. FINDINGS: Chest: The heart size and mediastinal contours are within normal limits. Lung volumes are low. No consolidation, effusion, or pneumothorax is seen. Surgical clips are present in the right upper quadrant. No acute osseous abnormality. Pelvis: There is no evidence of acute fracture or dislocation. Mild degenerative changes are noted at the hips bilaterally. Vascular calcifications are seen in the pelvis and bilateral lower extremities. IMPRESSION: 1. No active disease. 2. No acute fracture or dislocation at the pelvis. Electronically Signed   By: Leita Birmingham M.D.   On: 12/09/2023 16:32   DG Pelvis Portable Result Date: 12/09/2023 CLINICAL DATA:  Weakness, multiple falls. EXAM: PORTABLE CHEST 1 VIEW, PORTABLE PELVIS COMPARISON:  08/16/2011. FINDINGS: Chest: The heart size and mediastinal contours are within normal limits. Lung volumes are low. No consolidation, effusion, or pneumothorax is seen. Surgical clips are present in the right upper quadrant. No acute osseous abnormality. Pelvis: There is no evidence of acute fracture or dislocation. Mild degenerative changes are noted at the hips bilaterally. Vascular calcifications are seen in the pelvis and bilateral lower extremities. IMPRESSION: 1. No active disease. 2. No acute fracture or dislocation at the pelvis. Electronically Signed   By: Leita Birmingham M.D.   On: 12/09/2023 16:32    ROS Blood pressure (!) 154/88, pulse 85, temperature 98.8 F (37.1 C), resp. rate 18, height 5' 4 (1.626 m), weight 93.4 kg, SpO2 97%. Physical Exam HENT:     Head: Normocephalic and atraumatic.     Right Ear: External ear normal.     Left Ear: External ear normal.      Nose: Nose normal.     Mouth/Throat:     Mouth: Mucous membranes are moist.  Eyes:     Extraocular Movements: Extraocular movements intact.     Conjunctiva/sclera: Conjunctivae normal.     Pupils: Pupils are equal, round, and reactive to light.  Pulmonary:     Effort: Pulmonary effort is normal.  Musculoskeletal:     Cervical back: Normal range of motion.  Skin:    General: Skin is warm and dry.  Neurological:     Mental Status: She is alert.  Psychiatric:        Mood and Affect: Mood normal.        Behavior: Behavior normal.     Assessment/Plan: 46F with left sided neck mass deep to hyoid - Denies sore throat and recent URI - No stridor, respiratory distress, difficulty swallowing and able to lay flat. - Differential includes thyroglossal duct cyst - Laryngoscopy performed, slightly limited view 2/2 cooperation but TVF with good movement bilaterally and patent airway. There was fullness on left side but no mucosal changes or lesions.  - This can be worked up outpatient  - Please reach out if throat or airway symptoms develop  - Follow up with ENT outpatient. Can call for appt.  Penne Croak 12/10/2023 1:14 PM Available on [employer]iku chat and [employer]  Department of Otolaryngology Contact Info: Otolaryngology [employer] including questions about appointments or questions: [phone] - If after normal business hours (Monday-Friday after 5PM or Weekends/Holidays), please call same number and follow prompts for Patient Access Line. There is a physician on call for urgent matters. For life threatening emergencies, please call 911    Penne Croak 12/10/2023, 1:03 PM  I spent 65 minutes (exclusive of separately billable procedures) on the day of the encounter reviewing the patient's chart, seeing the patient face to face, discussing the findings and the treatment plan, and documenting in the EHR.

## 2023-12-10 NOTE — Procedures (Signed)
 Procedure Note Pre-procedure diagnosis:  neck mass deep to hyoid Post-procedure diagnosis: Same Procedure: Transnasal Fiberoptic Laryngoscopy, CPT 31575 - Mod 25 Indication: neck mass seen on CT scan deep to hyoid on the left Complications: None apparent EBL: 0 mL  The procedure was undertaken to further evaluate the patient's complaint of falls, with mirror exam inadequate for appropriate examination due to gag reflex and poor patient tolerance  Procedure:  Patient was identified as correct patient. Verbal consent was obtained. The nose was sprayed with oxymetazoline and 4% lidocaine . The The flexible laryngoscope was passed through the nose to view the nasal cavity, pharynx (oropharynx, hypopharynx) and larynx.  The larynx was examined at rest and during multiple phonatory tasks. Documentation was obtained and reviewed with patient. The scope was removed. The patient tolerated the procedure well.  Findings: The nasal cavity and nasopharynx did not reveal any masses or lesions, mucosa appeared to be without obvious lesions. The tongue base, pharyngeal walls, piriform sinuses, vallecula, epiglottis and postcricoid region are normal in appearance EXCEPT: left sided fullness along hypopharynx. The visualized portion of the subglottis and proximal trachea is widely patent. The vocal folds are mobile bilaterally. There are no lesions on the free edge of the vocal folds nor elsewhere in the larynx worrisome for malignancy.    Electronically signed by: Penne Croak, DO 12/10/2023 1:14 PM

## 2023-12-10 NOTE — H&P (Signed)
 History and Physical    Patient: Angela Morrison FMW:[id number] DOB: 06/29/1950 DOA: 12/09/2023 DOS: the patient was seen and examined on 12/10/2023 PCP: Aisha Harvey, MD  Patient coming from: Home  Chief Complaint:  Chief Complaint  Patient presents with   Fall   Weakness   HPI: Angela Morrison is a 73 y.o. female with medical history significant of HTN, COPD, dementia, who presented to [facility] ED for evaluation of altered mental status relative to her baseline dementia c/b multiple mechanical GLFs, as noted by the patient's family over the last 2 to 3 days.    Pt is a limited historian. From what I can gather from Epic review and brief interview, pt here w/ worsening confusion and multiple falls. Per overnight provider, Family also conveys that the patient has had multiple falls over the last 1 to 2 days, which have been ground-level in nature, without any reported loss of consciousness, although family notes that a few of these falls have been unwitnessed.  Family also notes that the patient has exhibited evidence of left lower extremity weakness, resulting in some dragging of the left leg for greater than 1 month, with last known well as a relates to the left lower extremity weakness reported to be September 2025.  No recent fever.    In the ED, pt AFVSS. Labs notable for WBC 12.2. UA w/ pyuria and bacteruira. CXR w/o evidence of acute cardiopulmonary process. Regarding falls, CT head showed no evidence of acute intracranial abnormality, including no evidence of acute infarct.  CT cervical spine without contrast showed a nonspecific neck mass.  EDP ordered MRI, consulted ENT for neck mass and requested [facility] transfer.    Review of Systems: As mentioned in the history of present illness. All other systems reviewed and are negative. Past Medical History:  Diagnosis Date   Anemia    few yrs ago none recent   Arthritis    Constipation    Coronary artery disease 2005   S/P PCI of RCA    Depression    Diabetes mellitus without complication (HCC)    type 2   Edema, lower extremity    History of MI (myocardial infarction)    Hyperlipidemia    Hypertension    Leg cramps    Lower back pain    Obesity    PONV (postoperative nausea and vomiting)    Shoulder pain    Thyroid  nodule    S/P bx 8/13 c/w goiter   Vitamin D deficiency    Weakness    Past Surgical History:  Procedure Laterality Date   appendectomy Right    BACK SURGERY     lower back   CHOLECYSTECTOMY N/A 07/02/2017   Procedure: LAPAROSCOPIC CHOLECYSTECTOMY WITH INTRAOPERATIVE CHOLANGIOGRAM;  Surgeon: Mikell Katz, MD;  Location: [facility] ORS;  Service: General;  Laterality: N/A;   COLONOSCOPY WITH PROPOFOL  N/A 05/17/2016   Procedure: COLONOSCOPY WITH PROPOFOL ;  Surgeon: Gladis MARLA Louder, MD;  Location: [facility] ENDOSCOPY;  Service: Endoscopy;  Laterality: N/A;   Social History:  reports that she has never smoked. She has never used smokeless tobacco. She reports current alcohol use. She reports that she does not use drugs.  Allergies  Allergen Reactions   Crestor [Rosuvastatin]     constipation   Morphine  Sulfate Other (See Comments)   Penicillins Itching    Has patient had a PCN reaction causing immediate rash, facial/tongue/throat swelling, SOB or lightheadedness with hypotension: No Has patient had a PCN reaction causing severe rash  involving mucus membranes or skin necrosis: No Has patient had a PCN reaction that required hospitalization No Has patient had a PCN reaction occurring within the last 10 years: No If all of the above answers are NO, then may proceed with Cephalosporin use.    Family History  Problem Relation Age of Onset   Alzheimer's disease Mother    Hypertension Mother    Dementia Father    Hypertension Father    Alcoholism Father     Prior to Admission medications   Medication Sig Start Date End Date Taking? Authorizing Provider  aspirin  EC 81 MG tablet Take 81 mg by mouth at  bedtime.    [provider]  atorvastatin  (LIPITOR) 80 MG tablet TAKE ONE TABLET BY MOUTH EVERY MORNING 06/22/21   Shlomo Wilbert SAUNDERS, MD  Cholecalciferol (VITAMIN D3) 2000 UNITS capsule Take 2,000 Units by mouth daily.     [provider]  Evolocumab  (REPATHA  SURECLICK) 140 MG/ML SOAJ Inject 1 pen into the skin every 14 (fourteen) days. 01/05/21   Shlomo Wilbert SAUNDERS, MD  hydrochlorothiazide  (HYDRODIURIL ) 25 MG tablet Take 1 tablet (25 mg total) by mouth every morning. 12/17/21   Shlomo Wilbert SAUNDERS, MD  JARDIANCE 10 MG TABS tablet Take 10 mg by mouth daily. 03/05/20   [provider]  Naproxen Sodium 220 MG CAPS 2 capsules with food or milk as needed 12/17/18   [provider]  Allie Gutta Calcium  500 MG TABS 1 tablet with meals    [provider]  Polyethyl Glycol-Propyl Glycol (SYSTANE) 0.4-0.3 % SOLN 1 drop into affected eye as needed    [provider]  potassium chloride  (KLOR-CON ) 10 MEQ tablet TAKE ONE TABLET BY MOUTH every morning 11/02/21   Hobart Powell BRAVO, MD  valsartan (DIOVAN) 80 MG tablet Take 80 mg by mouth daily. 03/05/20   [provider]    Physical Exam: Vitals:   12/10/23 0230 12/10/23 0245 12/10/23 0500 12/10/23 0617  BP: (!) 161/88  (!) 162/88 (!) 158/89  Pulse:  95 80 87  Resp: 16 16 19 17   Temp:    98 F (36.7 C)  TempSrc:    Oral  SpO2:  100% 100% 100%  Weight:    93.4 kg  Height:    5' 4 (1.626 m)   General: Alert, oriented x3, resting comfortably in no acute distress Respiratory: Lungs clear to auscultation bilaterally with normal respiratory effort; no w/r/r Cardiovascular: Regular rate and rhythm w/o m/r/g Abdomen: Soft, nontender, nondistended. Positive bowel sounds   Data Reviewed:  Lab Results  Component Value Date   WBC 12.2 (H) 12/09/2023   HGB 12.4 12/09/2023   HCT 38.6 12/09/2023   MCV 89.8 12/09/2023   PLT 221 12/09/2023   Lab Results  Component Value Date   GLUCOSE 133 (H)  12/09/2023   CALCIUM  10.7 (H) 12/09/2023   NA 141 12/09/2023   K 4.4 12/09/2023   CO2 27 12/09/2023   CL 104 12/09/2023   BUN 18 12/09/2023   CREATININE 1.03 (H) 12/09/2023   Lab Results  Component Value Date   ALT 16 12/09/2023   AST 40 12/09/2023   ALKPHOS 162 (H) 12/09/2023   BILITOT 0.9 12/09/2023   Lab Results  Component Value Date   INR 0.97 08/16/2011   Radiology: CT Head Wo Contrast Result Date: 12/09/2023 EXAM: CT HEAD WITHOUT CONTRAST 12/09/2023 04:47:28 PM TECHNIQUE: CT of the head was performed without the administration of intravenous contrast. Automated exposure control, iterative reconstruction,  and/or weight based adjustment of the mA/kV was utilized to reduce the radiation dose to as low as reasonably achievable. COMPARISON: None available. CLINICAL HISTORY: Head trauma, minor (Age >= 65y). FINDINGS: BRAIN AND VENTRICLES: No acute hemorrhage. No evidence of acute infarct. 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 Left cerebellar infarct. Extensive periventricular and subcortical white matter low-density changes compatible with chronic microvascular ischemic change. Mild diffuse cerebral volume loss. Cavum septum pellucidum et vergae. No hydrocephalus. No extra-axial collection. No mass effect or midline shift. ORBITS: No acute abnormality. SINUSES: No acute abnormality. SOFT TISSUES AND SKULL: No acute soft tissue abnormality. No skull fracture. Atherosclerotic calcifications in large vessels of skull base. IMPRESSION: 1. No acute intracranial abnormality. 2. 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 3. Extensive periventricular and subcortical white matter changes compatible with chronic microvascular ischemic change. Electronically signed by: Franky Stanford MD 12/09/2023 05:01 PM EDT RP Workstation: [id number]   CT Cervical Spine Wo Contrast Result Date: 12/09/2023 EXAM: CT CERVICAL SPINE WITHOUT CONTRAST 12/09/2023 04:47:28 PM TECHNIQUE:  CT of the cervical spine was performed without the administration of intravenous contrast. Multiplanar reformatted images are provided for review. Automated exposure control, iterative reconstruction, and/or weight based adjustment of the mA/kV was utilized to reduce the radiation dose to as low as reasonably achievable. COMPARISON: None available. CLINICAL HISTORY: Neck trauma (Age >= 65y) FINDINGS: CERVICAL SPINE: BONES AND ALIGNMENT: No acute fracture or traumatic malalignment. DEGENERATIVE CHANGES: No significant degenerative changes. 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 This causes mild mass effect on the airway, but the airway remains widely patent. Non-erupted central maxillary incisors. Edentulous oral cavity. IMPRESSION: 1. 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 MRI of the neck with and without contrast recommended for further evaluation. 2. No acute abnormality of the cervical spine. Electronically signed by: Franky Stanford MD 12/09/2023 05:00 PM EDT RP Workstation: [id number]   DG Chest Portable 1 View Result Date: 12/09/2023 CLINICAL DATA:  Weakness, multiple falls. EXAM: PORTABLE CHEST 1 VIEW, PORTABLE PELVIS COMPARISON:  08/16/2011. FINDINGS: Chest: The heart size and mediastinal contours are within normal limits. Lung volumes are low. No consolidation, effusion, or pneumothorax is seen. Surgical clips are present in the right upper quadrant. No acute osseous abnormality. Pelvis: There is no evidence of acute fracture or dislocation. Mild degenerative changes are noted at the hips bilaterally. Vascular calcifications are seen in the pelvis and bilateral lower extremities. IMPRESSION: 1. No active disease. 2. No acute fracture or dislocation at the pelvis. Electronically Signed    By: Leita Birmingham M.D.   On: 12/09/2023 16:32   DG Pelvis Portable Result Date: 12/09/2023 CLINICAL DATA:  Weakness, multiple falls. EXAM: PORTABLE CHEST 1 VIEW, PORTABLE PELVIS COMPARISON:  08/16/2011. FINDINGS: Chest: The heart size and mediastinal contours are within normal limits. Lung volumes are low. No consolidation, effusion, or pneumothorax is seen. Surgical clips are present in the right upper quadrant. No acute osseous abnormality. Pelvis: There is no evidence of acute fracture or dislocation. Mild degenerative changes are noted at the hips bilaterally. Vascular calcifications are seen in the pelvis and bilateral lower extremities. IMPRESSION: 1. No active disease. 2. No acute fracture or dislocation at the pelvis. Electronically Signed   By:  Leita Birmingham M.D.   On: 12/09/2023 16:32    Assessment and Plan: 51F h/o HTN, HLD, COPD, dementia, who presented to [facility] ED for evaluation of acute encephalopathy relative to her baseline dementia c/b multiple mechanical GLFs, as noted by the patient's family over the last 2 to 3 days.   Acute encephalopathy Acute cystitis -IV CTX 1g daily for now -F/u urine and blood cultures; titrate abx accordingly  Recurrent falls High suspicion for deconditioning; no obvious focal deficits on exam -PT/OT consulted; apprec eval/recs -F/u MRI brain WO  Neck mass ENT consulted (Dr. Penne Croak); apprec eval/recs  HTN -Irbesartan 75mg  daily (in lieu pta valsartan 80mg  daily)  HLD -PTA atorvastatin   COPD -Duonebs prn   Advance Care Planning:   Code Status: Full Code   Consults: ENT  Family Communication: Daughter  Severity of Illness: The appropriate patient status for this patient is INPATIENT. Inpatient status is judged to be reasonable and necessary in order to provide the required intensity of service to ensure the patient's safety. The patient's presenting symptoms, physical exam findings, and initial radiographic and laboratory data in  the context of their chronic comorbidities is felt to place them at high risk for further clinical deterioration. Furthermore, it is not anticipated that the patient will be medically stable for discharge from the hospital within 2 midnights of admission.   * I certify that at the point of admission it is my clinical judgment that the patient will require inpatient hospital care spanning beyond 2 midnights from the point of admission due to high intensity of service, high risk for further deterioration and high frequency of surveillance required.*   ------- I spent 55 minutes reviewing previous notes, at the bedside counseling/discussing the treatment plan, and performing clinical documentation.  Author: Marsha Ada, MD 12/10/2023 7:34 AM  For on call review [email or web address].

## 2023-12-10 NOTE — Plan of Care (Signed)

## 2023-12-11 DIAGNOSIS — G9340 Encephalopathy, unspecified: Secondary | ICD-10-CM | POA: Diagnosis not present

## 2023-12-11 NOTE — Evaluation (Addendum)
 Addendum:  After talking to pt's daughter, the home set up the pt provided was not accurate. Pt's husband has just recently had a knee replacement and can not/should not provide physical assistance. Their bedroom is on the second floor. Daughter is supportive but travels for work and is not able to provide 24 hours support. Given this information, Patient will benefit from continued inpatient follow up therapy, <3 hours/day. Pt's daughter is in agreement.   Physical Therapy Evaluation Patient Details Name: Angela Morrison MRN: [id number] DOB: [birth year] Today's Date: 12/11/2023  History of Present Illness  73 year old female presenting to ED 11/01 with weakness and at least 4 falls in the last 24 hours, as well as inability to care for herself in the home. Labs notable for WBC 12.2. UA w/ pyuria and bacteruira.Head CT clear  CT cervical spine without contrast showed a nonspecific neck mass. 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    PMH: HTN, HLD, CAD, obesity, DM2, depression, arthritis, vitamin D deficiency, vascular dementia  Clinical Impression  PTA pt reports living with her husband in single story home with 3 steps to enter. Pt reports that she is independent with ambulation without AD, and independent with ADLs, husband does cooking and financial management, pt drives to the grocery store and manages her medication. PT will try to confirm this with daughter when she returns. Pt is limited in safe mobility by decreased command follow, dizziness with changing from supine to sitting EoB, decreased  balance and weakness. Pt is modAx2 for bed mobility, and with coming to EoB has LoB and complaints of dizziness requiring modAx2 for steadying. Pt is minAx2 for sit to stand from elevated surface and modAx2 for sit to stand from low toilet surface. Pt needs minAx2 for  ambulation for steadying and managing RW around obstacles in room. PT attempted to have pt perform finger follow and head turns to rule out BPPV for dizziness associated with positional change.Pt with difficulty following commands, reporting it made her dizzy to follow my finger. Formal Vestibular Exam ordered. Patient will benefit from intensive inpatient follow-up therapy, >3 hours/day. PT will continue to follow acutely.        If plan is discharge home, recommend the following: A lot of help with walking and/or transfers;A lot of help with bathing/dressing/bathroom;Assistance with cooking/housework;Direct supervision/assist for medications management;Direct supervision/assist for financial management;Assist for transportation;Help with stairs or ramp for entrance;Supervision due to cognitive status   Can travel by private vehicle    Yes    Equipment Recommendations Rolling walker (2 wheels)  Recommendations for Other Services  Rehab consult    Functional Status Assessment Patient has had a recent decline in their functional status and demonstrates the ability to make significant improvements in function in a reasonable and predictable amount of time.     Precautions / Restrictions Precautions Precautions: Fall Precaution/Restrictions Comments: multiple falls immediately prior to hospitalization Restrictions Weight Bearing Restrictions Per Provider Order: No      Mobility  Bed Mobility Overal bed mobility: Needs Assistance Bed Mobility: Supine to Sit     Supine to sit: Mod assist, +2 for physical assistance, HOB elevated, Used rails     General bed mobility comments: pt with difficulty sequencing coming to seated EoB, needing assistance with bringing UE to bed rail to assist with pulling self to EOB, modAx2 for pad scoot of hips to  EoB and trunk to upright, pt complaints of dizziness, requiring modAx2 to keep upright,  once BP collected 144/123 on her forearm     Transfers Overall transfer level: Needs assistance Equipment used: Rolling walker (2 wheels) Transfers: Sit to/from Stand Sit to Stand: Min assist, From elevated surface, Mod assist, +2 safety/equipment           General transfer comment: min Ax2 for power up from elevated bed surface, modAx2 from low toilet seat,    Ambulation/Gait Ambulation/Gait assistance: Min assist, +2 physical assistance Gait Distance (Feet): 12 Feet (x2) Assistive device: Rolling walker (2 wheels) Gait Pattern/deviations: Step-through pattern, Decreased step length - right, Decreased step length - left, Shuffle, Trunk flexed Gait velocity: slowed Gait velocity interpretation: <1.31 ft/sec, indicative of household ambulator   General Gait Details: minAx2 and constant multimodal cuing ffor steadying and management of RW around obstacles in the room,      Balance Overall balance assessment: Needs assistance Sitting-balance support: Feet supported, Single extremity supported, No upper extremity supported Sitting balance-Leahy Scale: Fair     Standing balance support: No upper extremity supported, During functional activity, Reliant on assistive device for balance Standing balance-Leahy Scale: Poor Standing balance comment: benefits from RW with dynamic balance                             Pertinent Vitals/Pain Pain Assessment Pain Assessment: No/denies pain    Home Living Family/patient expects to be discharged to:: Private residence Living Arrangements: Spouse/significant other Available Help at Discharge: Family Type of Home: House Home Access: Stairs to enter Entrance Stairs-Rails: Can reach both Entrance Stairs-Number of Steps: 3   Home Layout: One level Home Equipment: Grab bars - tub/shower      Prior Function Prior Level of Function : Independent/Modified Independent             Mobility Comments: ambulates community distances without AD, and drives ADLs Comments:  independent with ADLs, laundry, cooks meals     Extremity/Trunk Assessment   Upper Extremity Assessment Upper Extremity Assessment: Defer to OT evaluation    Lower Extremity Assessment Lower Extremity Assessment: Generalized weakness;Difficult to assess due to impaired cognition    Cervical / Trunk Assessment Cervical / Trunk Assessment: Normal  Communication   Communication Communication: No apparent difficulties    Cognition   Behavior During Therapy: Flat affect   PT - Cognitive impairments: History of cognitive impairments                       PT - Cognition Comments: does not remember what caused her falls Following commands: Impaired Following commands impaired: Follows one step commands inconsistently, Follows one step commands with increased time     Cueing Cueing Techniques: Verbal cues, Gestural cues, Tactile cues, Visual cues     General Comments General comments (skin integrity, edema, etc.): screened pt for Vestibular dysfunction, pt with difficulty with command follow for finger follow and head turns, pt reports dizziness with attempt to finger follow, pt very emotional when daughter and grandsons visit and when best friend arrives        Assessment/Plan    PT Assessment Patient needs continued PT services  PT Problem List Decreased strength;Decreased activity tolerance;Decreased balance;Decreased mobility;Decreased coordination;Decreased cognition;Decreased knowledge of use of DME       PT Treatment Interventions DME instruction;Gait training;Stair training;Functional mobility training;Therapeutic activities;Therapeutic exercise;Balance training;Cognitive remediation;Neuromuscular re-education;Patient/family education    PT Goals (  Current goals can be found in the Care Plan section)  Acute Rehab PT Goals PT Goal Formulation: With patient Time For Goal Achievement: 12/25/23 Potential to Achieve Goals: Fair    Frequency Min 3X/week      Co-evaluation PT/OT/SLP Co-Evaluation/Treatment: Yes Reason for Co-Treatment: For patient/therapist safety;Necessary to address cognition/behavior during functional activity PT goals addressed during session: Mobility/safety with mobility         AM-PAC PT 6 Clicks Mobility  Outcome Measure Help needed turning from your back to your side while in a flat bed without using bedrails?: A Little Help needed moving from lying on your back to sitting on the side of a flat bed without using bedrails?: Total Help needed moving to and from a bed to a chair (including a wheelchair)?: Total Help needed standing up from a chair using your arms (e.g., wheelchair or bedside chair)?: Total Help needed to walk in hospital room?: Total Help needed climbing 3-5 steps with a railing? : Total 6 Click Score: 8    End of Session Equipment Utilized During Treatment: Gait belt Activity Tolerance: Patient tolerated treatment well Patient left: in chair;with call bell/phone within reach;with chair alarm set;with family/visitor present Nurse Communication: Mobility status;Other (comment) (possible dizziness with postional change) PT Visit Diagnosis: Unsteadiness on feet (R26.81);Muscle weakness (generalized) (M62.81);History of falling (Z91.81);Repeated falls (R29.6);Difficulty in walking, not elsewhere classified (R26.2);Other symptoms and signs involving the nervous system (R29.898)    Time: 8876-8846 PT Time Calculation (min) (ACUTE ONLY): 30 min   Charges:   PT Evaluation $PT Eval Moderate Complexity: 1 Mod   PT General Charges $$ ACUTE PT VISIT: 1 Visit         Korde Jeppsen B. Fleeta Lapidus PT, DPT Acute Rehabilitation Services Please use secure chat or  Call Office [phone]   BLOSSIE RAFFEL [facility] 12/11/2023, 12:25 PM

## 2023-12-11 NOTE — Progress Notes (Signed)
 Inpatient Rehab Admissions Coordinator Note:  Per therapy recommendations patient was screened for CIR candidacy by Reche FORBES Lowers, PT. At this time, pt does not appear to demonstrate medical necessity to support insurance approval for a CIR admission.  We will not pursue a rehab consult at this time.  Recommend other rehab venues to be pursued.  Please contact me with any questions.  Rawson Minix E Kacee Sukhu, [place] [phone] 12/11/23 3:49 PM

## 2023-12-11 NOTE — Plan of Care (Signed)

## 2023-12-11 NOTE — Evaluation (Addendum)
 Occupational Therapy Evaluation Patient Details Name: Angela Morrison MRN: [id number] DOB: 1950-03-23 Today's Date: 12/11/2023   History of Present Illness   73 year old female presenting to ED 11/01 with weakness and at least 4 falls in the last 24 hours, as well as inability to care for herself in the home. Labs notable for WBC 12.2. UA w/ pyuria and bacteruira. CT cervical spine without contrast showed a nonspecific neck mass. 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    PMH: HTN, HLD, CAD, obesity, DM2, depression, arthritis, vitamin D deficiency, vascular dementia     Clinical Impressions PTA, pt lives with spouse, reports typically Independent with ADLs, light household IADLs and mobility without need for AD. Pt presents now with deficits in cognition, standing balance and strength w/ intermittent reports of dizziness. (Orthostatics negative; BP elevated). Pt requires Mod A x 2 for bed mobility, Min A x 2 for safety w/ bathroom mobility using RW and up to Mod A x 2 to stand from lower surfaces. Pt requires Min A for UB ADL and Mod-Max A for LB ADLs. Hope to further assess vision, dizziness and cognition in next sessions. As pt is below baseline and shows good rehab potential, recommend intensive rehab services prior to DC home.     If plan is discharge home, recommend the following:   A lot of help with walking and/or transfers;A lot of help with bathing/dressing/bathroom;Assistance with cooking/housework;Direct supervision/assist for medications management;Direct supervision/assist for financial management;Assist for transportation     Functional Status Assessment   Patient has had a recent decline in their functional status and demonstrates the ability to make significant improvements in function in a reasonable and predictable amount of time.      Equipment Recommendations   Other (comment);BSC/3in1 (RW; TBD)     Recommendations for Other Services   Rehab consult     Precautions/Restrictions   Precautions Precautions: Fall Precaution/Restrictions Comments: multiple falls immediately prior to hospitalization Restrictions Weight Bearing Restrictions Per Provider Order: No     Mobility Bed Mobility Overal bed mobility: Needs Assistance Bed Mobility: Supine to Sit     Supine to sit: Mod assist, +2 for physical assistance, HOB elevated, Used rails     General bed mobility comments: pt with difficulty sequencing coming to seated EoB, needing assistance with bringing UE to bed rail to assist with pulling self to EOB, modAx2 for pad scoot of hips to EoB and trunk to upright, pt complaints of dizziness, requiring modAx2 to keep upright,  once BP collected 144/123 on her forearm    Transfers Overall transfer level: Needs assistance Equipment used: Rolling walker (2 wheels) Transfers: Sit to/from Stand Sit to Stand: Min assist, From elevated surface, Mod assist, +2 safety/equipment           General transfer comment: min Ax2 for power up from elevated bed surface, modAx2 from low toilet seat,      Balance Overall balance assessment: Needs assistance Sitting-balance support: Feet supported, Single extremity supported, No upper extremity supported Sitting balance-Leahy Scale: Fair     Standing balance support: No upper extremity supported, During functional activity, Reliant on assistive device for balance Standing balance-Leahy Scale: Poor Standing balance comment: benefits from RW with dynamic balance                           ADL  either performed or assessed with clinical judgement   ADL Overall ADL's : Needs assistance/impaired Eating/Feeding: Independent   Grooming: Contact guard assist;Standing   Upper Body Bathing: Minimal assistance;Sitting   Lower Body Bathing: Maximal  assistance;Sitting/lateral leans;Sit to/from stand   Upper Body Dressing : Minimal assistance;Sitting   Lower Body Dressing: Maximal assistance;Sit to/from stand;Sitting/lateral leans   Toilet Transfer: Minimal assistance;+2 for safety/equipment;Ambulation;Regular Toilet;Moderate assistance;Rolling walker (2 wheels) Toilet Transfer Details (indicate cue type and reason): Min A x 2 for safety to ambulate to/from bathroom with some cues/assist needed for RW negotiation. Pt required Mod A x 2 for safety to stand from lower height toilet w/ use of grab bar Toileting- Clothing Manipulation and Hygiene: Sitting/lateral lean;Sit to/from stand;Moderate assistance Toileting - Clothing Manipulation Details (indicate cue type and reason): assist for posterior hygiene in standing. able to perform anterior hygiene seated on toilet     Functional mobility during ADLs: Minimal assistance;+2 for safety/equipment;Rolling walker (2 wheels)       Vision Baseline Vision/History: 1 Wears glasses Ability to See in Adequate Light: 0 Adequate Patient Visual Report: Other (comment) (to be further assessed. some head turns causing dizziness) Vision Assessment?: Vision impaired- to be further tested in functional context Additional Comments: to further assessed. pt with difficulty following directions to track finger. also reports dizziness when attempting to complete head turns when looking at finger     Perception         Praxis         Pertinent Vitals/Pain Pain Assessment Pain Assessment: No/denies pain     Extremity/Trunk Assessment Upper Extremity Assessment Upper Extremity Assessment: Generalized weakness;Right hand dominant   Lower Extremity Assessment Lower Extremity Assessment: Defer to PT evaluation   Cervical / Trunk Assessment Cervical / Trunk Assessment: Normal   Communication Communication Communication: No apparent difficulties   Cognition Arousal: Alert Behavior During Therapy:  Flat affect Cognition: Cognition impaired   Orientation impairments: Situation Awareness: Intellectual awareness intact, Online awareness impaired Memory impairment (select all impairments): Working memory Attention impairment (select first level of impairment): Selective attention Executive functioning impairment (select all impairments): Organization, Problem solving OT - Cognition Comments: hx of dementia per chart. flat affect but does respond to humor. decreased insight into deficits, potential causes of falls, etc. consistent command following                 Following commands: Impaired Following commands impaired: Follows one step commands inconsistently, Follows one step commands with increased time     Cueing  General Comments   Cueing Techniques: Verbal cues;Gestural cues;Tactile cues;Visual cues  screened pt for Vestibular dysfunction, pt with difficulty with command follow for finger follow and head turns, pt reports dizziness with attempt to finger follow, pt very emotional when daughter and grandsons visit and when best friend arrives   Exercises     Shoulder Instructions      Home Living Family/patient expects to be discharged to:: Private residence Living Arrangements: Spouse/significant other Available Help at Discharge: Family;Available 24 hours/day Type of Home: House Home Access: Stairs to enter [employer] of Steps: 3 Entrance Stairs-Rails: Can reach both Home Layout: Two level     Bathroom Shower/Tub: Tub/shower unit (Garden shower)   Bathroom Toilet: Standard Bathroom Accessibility: No   Home Equipment: Grab bars - tub/shower;Hand held shower head   Additional Comments: bedroom on first floor      Prior Functioning/Environment Prior Level of Function : Independent/Modified Independent  Mobility Comments: ambulates community distances without AD, and drives ADLs Comments: independent with ADLs, laundry. husband  cooks. pt reports managing her own meds    OT Problem List: Decreased strength;Decreased activity tolerance;Impaired vision/perception;Impaired balance (sitting and/or standing);Decreased cognition;Decreased safety awareness;Decreased knowledge of use of DME or AE   OT Treatment/Interventions: Self-care/ADL training;Therapeutic exercise;Energy conservation;DME and/or AE instruction;Therapeutic activities;Patient/family education;Balance training      OT Goals(Current goals can be found in the care plan section)   Acute Rehab OT Goals Patient Stated Goal: go to bathroom OT Goal Formulation: With patient Time For Goal Achievement: 12/25/23 Potential to Achieve Goals: Good ADL Goals Pt Will Perform Lower Body Bathing: with contact guard assist;sitting/lateral leans;sit to/from stand Pt Will Perform Lower Body Dressing: with contact guard assist;sit to/from stand;sitting/lateral leans Pt Will Transfer to Toilet: with contact guard assist;ambulating Additional ADL Goal #1: Pt to increase standing tolerance > 6 min during ADLs/mobility without need for seated rest break   OT Frequency:  Min 2X/week    Co-evaluation   Reason for Co-Treatment: For patient/therapist safety;Necessary to address cognition/behavior during functional activity PT goals addressed during session: Mobility/safety with mobility        AM-PAC OT 6 Clicks Daily Activity     Outcome Measure Help from another person eating meals?: None Help from another person taking care of personal grooming?: A Little Help from another person toileting, which includes using toliet, bedpan, or urinal?: A Lot Help from another person bathing (including washing, rinsing, drying)?: A Lot Help from another person to put on and taking off regular upper body clothing?: A Little Help from another person to put on and taking off regular lower body clothing?: A Lot 6 Click Score: 16   End of Session Equipment Utilized During Treatment:  Gait belt;Rolling walker (2 wheels) Nurse Communication: Mobility status  Activity Tolerance: Patient tolerated treatment well Patient left: in chair;with call bell/phone within reach;with chair alarm set;with family/visitor present  OT Visit Diagnosis: Unsteadiness on feet (R26.81);Other abnormalities of gait and mobility (R26.89);Muscle weakness (generalized) (M62.81);Other symptoms and signs involving cognitive function                Time: 8876-8843 OT Time Calculation (min): 33 min Charges:  OT General Charges $OT Visit: 1 Visit OT Evaluation $OT Eval Moderate Complexity: 1 Mod  Mliss NOVAK, OTR/L Acute Rehab Services Office: [phone]   Mliss Fish 12/11/2023, 1:29 PM

## 2023-12-11 NOTE — Progress Notes (Signed)
 PROGRESS NOTE    Angela Morrison  FMW:[id number] DOB: 09-23-1950 DOA: 12/09/2023 PCP: Aisha Harvey, MD   Brief Narrative: This 73 years old female with PMH significant for hypertension, COPD, dementia presented in the ED for further evaluation of altered mental status related to her baseline dementia for 2 to 3 days.  Patient presented here with worsening confusion and multiple falls without loss of consciousness, although family notes that few of these falls have been unwitnessed.  Family also reports patient has exhibited evidence of left lower extremity weakness resulting in some dragging of left leg for greater than a month.  In the ED UA consistent with pyuria and bacteriuria.  CT head showed no evidence of acute intracranial abnormality.  CT cervical spine showed nonspecific neck mass. Patient was admitted for further evaluation and ENT is consulted for neck mass.  Assessment & Plan:   Principal Problem:   Acute encephalopathy Active Problems:   Neck mass  Acute encephalopathy:  Likely Acute cystitis: Continue IV ceftriaxone. Urine culture/ Blood culture and titrate abx accordingly. Continue IV hydration   Recurrent falls: High suspicion for deconditioning; No obvious focal deficits on exam. 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 Penne Croak); apprec eval/recs   HTN: Continue Irbesartan 75mg  daily (in lieu pta valsartan 80mg  daily).   Hyperlipidemia:  -PTA atorvastatin    COPD: -Duonebs prn  DVT prophylaxis: SCDs Code Status: Full code Family Communication: No family at bed side. Disposition Plan:    Status is: Inpatient Remains inpatient appropriate because: Severity of illness  Consultants:  None  Procedures:  MRI Brain.  Antimicrobials:  Anti-infectives (From admission, onward)    Start     Dose/Rate Route Frequency Ordered Stop   12/10/23 1100  cefTRIAXone (ROCEPHIN) 1  g in sodium chloride  0.9 % 100 mL IVPB        1 g 200 mL/hr over 30 Minutes Intravenous Every 24 hours 12/10/23 1009 12/15/23 1059      Subjective: Patient was seen and examined at bedside.  Overnight events noted. Patient seems confused, but following commands, denies any concerns.  Objective: Vitals:   12/11/23 0001 12/11/23 0500 12/11/23 0517 12/11/23 0835  BP: 135/86  (!) 147/85 120/79  Pulse: (!) 101  81 82  Resp:      Temp: 98.9 F (37.2 C)  99.3 F (37.4 C) 98.1 F (36.7 C)  TempSrc:    Oral  SpO2: 95%  98% 100%  Weight:  91.9 kg    Height:        Intake/Output Summary (Last 24 hours) at 12/11/2023 1419 Last data filed at 12/11/2023 0500 Gross per 24 hour  Intake 100 ml  Output 800 ml  Net -700 ml   Filed Weights   12/10/23 0617 12/11/23 0500  Weight: 93.4 kg 91.9 kg    Examination:  General exam: Appears calm and comfortable, deconditioned , not in any acute distress. Respiratory system: Clear to auscultation. Respiratory effort normal.  RR 12 Cardiovascular system: S1 & S2 heard, RRR. No JVD, murmurs, rubs, gallops or clicks.  Gastrointestinal system: Abdomen is non distended, soft and non tender.  Normal bowel sounds heard. Central nervous system: Alert and oriented x 1. No focal neurological deficits. Extremities: No edema, no cyanosis, no clubbing. Skin: No rashes, lesions or ulcers Psychiatry: Judgement and insight appear normal. Mood & affect appropriate.   Data Reviewed: I have personally reviewed following labs and imaging studies  CBC: Recent Labs  Lab 12/09/23 1546  WBC 12.2*  NEUTROABS 9.1*  HGB 12.4  HCT 38.6  MCV 89.8  PLT 221   Basic Metabolic Panel: Recent Labs  Lab 12/09/23 1546  NA 141  K 4.4  CL 104  CO2 27  GLUCOSE 133*  BUN 18  CREATININE 1.03*  CALCIUM  10.7*  MG 1.8   GFR: Estimated Creatinine Clearance: 53.4 mL/min (A) (by C-G formula based on SCr of 1.03 mg/dL (H)). Liver Function Tests: Recent Labs  Lab  12/09/23 1546  AST 40  ALT 16  ALKPHOS 162*  BILITOT 0.9  PROT 7.8  ALBUMIN 4.0   No results for input(s): LIPASE, AMYLASE in the last 168 hours. No results for input(s): AMMONIA in the last 168 hours. Coagulation Profile: No results for input(s): INR, PROTIME in the last 168 hours. Cardiac Enzymes: No results for input(s): CKTOTAL, CKMB, CKMBINDEX, TROPONINI in the last 168 hours. BNP (last 3 results) No results for input(s): PROBNP in the last 8760 hours. HbA1C: No results for input(s): HGBA1C in the last 72 hours. CBG: Recent Labs  Lab 12/09/23 1552  GLUCAP 128*   Lipid Profile: No results for input(s): CHOL, HDL, LDLCALC, TRIG, CHOLHDL, LDLDIRECT in the last 72 hours. Thyroid  Function Tests: Recent Labs    12/09/23 1546 12/09/23 1843  TSH 0.848  --   FREET4  --  1.12   Anemia Panel: No results for input(s): VITAMINB12, FOLATE, FERRITIN, TIBC, IRON, RETICCTPCT in the last 72 hours. Sepsis Labs: No results for input(s): PROCALCITON, LATICACIDVEN in the last 168 hours.  No results found for this or any previous visit (from the past 240 hours).   Radiology Studies: MR Neck Soft Tissue Only W or Wo Contrast Result Date: 12/10/2023 EXAM: MR NECK WITH AND WITHOUT INTRAVENOUS CONTRAST 12/10/2023 10:17:00 AM TECHNIQUE: Multiplanar multisequence MRI of the neck was performed with and without the administration of 9 mL gadobutrol (GADAVIST) 1 MMOL/ML injection. COMPARISON: CT cervical spine 12/09/2023. CLINICAL HISTORY: Neck mass, nonpulsatile. FINDINGS: LIMITATIONS: The examination is mildly to moderately motion degraded. PHARYNX AND LARYNX: As shown on today's CT, there is a mass deep to the hyoid bone which measures 3.1 x 1.8 x 1.9 cm. The mass demonstrates uniform mild to moderate T2 hyperintensity, intrinsic T1 hyperintensity, and no convincing enhancement. The mass extends across the midline bilaterally but is eccentric to  the left with mild mass effect on the supraglottic larynx. The midline component of the mass is intimately associated with the hyoid bone and extends between the hyoid bone and thyroid  cartilage. No associated bone or cartilage destruction is evident. No mucosal mass is identified. There is no parapharyngeal or retropharyngeal fluid collection or inflammation. SALIVARY GLANDS: The parotid and submandibular glands appear unremarkable. THYROID : Bilateral thyroid  nodules including a dominant left lower pole nodule measuring at least 2 cm in diameter, previously evaluated by ultrasound. LYMPH NODES: No cervical or supraclavicular lymphadenopathy is seen. SOFT TISSUES: No appreciable soft tissue swelling or other mass is seen. BRAIN, ORBITS AND SINUSES: Brain reported separately. The visualized portion of the orbits demonstrate no acute abnormality. Mild right ethmoid sinus mucosal thickening. Clear mastoid air cells. BONES: Mild cervical spondylosis. No suspicious marrow lesion. IMPRESSION: 1. 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 Electronically signed by: Dasie Hamburg MD 12/10/2023 10:44 AM EST RP Workstation: [id number]   MR BRAIN W WO CONTRAST Result Date: 12/10/2023 EXAM: MRI BRAIN WITH AND WITHOUT CONTRAST 12/10/2023 10:16:57 AM  TECHNIQUE: Multiplanar multisequence MRI of the head/brain was performed with and without the administration of 9 mL gadobutrol (GADAVIST) 1 MMOL/ML injection. COMPARISON: Head CT 12/20/2023 and MRI 07/19/2020. CLINICAL HISTORY: Neuro deficit, acute, stroke suspected. Multiple recent falls. FINDINGS: The study is mildly motion degraded. BRAIN AND VENTRICLES: There is a subcentimeter acute infarct in the posterior limb of the right internal capsule. A subcentimeter focus of restricted diffusion and associated enhancement in the left external capsule likely reflects a subacute infarct. There is also a punctate focus of mild trace diffusion weighted  signal hyperintensity with normal ADC in the left occipital white matter which may also reflect a subacute infarct. Chronic microhemorrhages are present in the left thalamus and cerebellum. Confluent T2 hyperintensities in the cerebral white matter bilaterally have progressed from the prior MRI and are nonspecific but compatible with severe chronic small vessel ischemic disease. Chronic lacunar infarcts are again seen in the basal ganglia, thalami, pons, and cerebellum. There is mild cerebral atrophy. A normal variant cavum septum pellucidum et verge is noted. No mass, midline shift, hydrocephalus, or extra axial fluid collection is identified. Major intracranial vascular flow voids are preserved. ORBITS: No acute abnormality. SINUSES: Moderate right ethmoid sinus mucosal thickening. Clear mastoid air cells. BONES AND SOFT TISSUES: Normal bone marrow signal and enhancement. Neck reported separately. IMPRESSION: 1. 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 2. Likely subacute infarcts in the left external capsule and left occipital white matter. 3. Severe chronic small vessel ischemic disease. Electronically signed by: Dasie Hamburg MD 12/10/2023 10:30 AM EST RP Workstation: [id number]   CT Head Wo Contrast Result Date: 12/09/2023 EXAM: CT HEAD WITHOUT CONTRAST 12/09/2023 04:47:28 PM TECHNIQUE: CT of the head was performed without the administration of intravenous contrast. Automated exposure control, iterative reconstruction, and/or weight based adjustment of the mA/kV was utilized to reduce the radiation dose to as low as reasonably achievable. COMPARISON: None available. CLINICAL HISTORY: Head trauma, minor (Age >= 65y). FINDINGS: BRAIN AND VENTRICLES: No acute hemorrhage. No evidence of acute infarct. 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 Left cerebellar infarct. Extensive periventricular and subcortical white matter low-density changes compatible with chronic  microvascular ischemic change. Mild diffuse cerebral volume loss. Cavum septum pellucidum et vergae. No hydrocephalus. No extra-axial collection. No mass effect or midline shift. ORBITS: No acute abnormality. SINUSES: No acute abnormality. SOFT TISSUES AND SKULL: No acute soft tissue abnormality. No skull fracture. Atherosclerotic calcifications in large vessels of skull base. IMPRESSION: 1. No acute intracranial abnormality. 2. 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 3. Extensive periventricular and subcortical white matter changes compatible with chronic microvascular ischemic change. Electronically signed by: Franky Stanford MD 12/09/2023 05:01 PM EDT RP Workstation: [id number]   CT Cervical Spine Wo Contrast Result Date: 12/09/2023 EXAM: CT CERVICAL SPINE WITHOUT CONTRAST 12/09/2023 04:47:28 PM TECHNIQUE: CT of the cervical spine was performed without the administration of intravenous contrast. Multiplanar reformatted images are provided for review. Automated exposure control, iterative reconstruction, and/or weight based adjustment of the mA/kV was utilized to reduce the radiation dose to as low as reasonably achievable. COMPARISON: None available. CLINICAL HISTORY: Neck trauma (Age >= 65y) FINDINGS: CERVICAL SPINE: BONES AND ALIGNMENT: No acute fracture or traumatic malalignment. DEGENERATIVE CHANGES: No significant degenerative changes. 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 This causes mild mass effect on the airway, but  the airway remains widely patent. Non-erupted central maxillary incisors. Edentulous oral cavity. IMPRESSION: 1. 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 MRI of the neck with and without contrast recommended for further evaluation. 2. No  acute abnormality of the cervical spine. Electronically signed by: Franky Stanford MD 12/09/2023 05:00 PM EDT RP Workstation: [id number]   DG Chest Portable 1 View Result Date: 12/09/2023 CLINICAL DATA:  Weakness, multiple falls. EXAM: PORTABLE CHEST 1 VIEW, PORTABLE PELVIS COMPARISON:  08/16/2011. FINDINGS: Chest: The heart size and mediastinal contours are within normal limits. Lung volumes are low. No consolidation, effusion, or pneumothorax is seen. Surgical clips are present in the right upper quadrant. No acute osseous abnormality. Pelvis: There is no evidence of acute fracture or dislocation. Mild degenerative changes are noted at the hips bilaterally. Vascular calcifications are seen in the pelvis and bilateral lower extremities. IMPRESSION: 1. No active disease. 2. No acute fracture or dislocation at the pelvis. Electronically Signed   By: Leita Birmingham M.D.   On: 12/09/2023 16:32   DG Pelvis Portable Result Date: 12/09/2023 CLINICAL DATA:  Weakness, multiple falls. EXAM: PORTABLE CHEST 1 VIEW, PORTABLE PELVIS COMPARISON:  08/16/2011. FINDINGS: Chest: The heart size and mediastinal contours are within normal limits. Lung volumes are low. No consolidation, effusion, or pneumothorax is seen. Surgical clips are present in the right upper quadrant. No acute osseous abnormality. Pelvis: There is no evidence of acute fracture or dislocation. Mild degenerative changes are noted at the hips bilaterally. Vascular calcifications are seen in the pelvis and bilateral lower extremities. IMPRESSION: 1. No active disease. 2. No acute fracture or dislocation at the pelvis. Electronically Signed   By: Leita Birmingham M.D.   On: 12/09/2023 16:32    Scheduled Meds:  atorvastatin   80 mg Oral q morning   ezetimibe   10 mg Oral Daily   irbesartan  75 mg Oral Daily   Continuous Infusions:  cefTRIAXone (ROCEPHIN)  IV 1 g (12/11/23 1023)     LOS: 1 day    Time spent: 50 mins    Darcel Dawley, MD [facility]   If 7PM-7AM, please contact night-coverage

## 2023-12-12 DIAGNOSIS — G9340 Encephalopathy, unspecified: Secondary | ICD-10-CM | POA: Diagnosis not present

## 2023-12-12 MED ORDER — ONDANSETRON HCL 4 MG/2ML IJ SOLN
4.0000 mg | Freq: Four times a day (QID) | INTRAMUSCULAR | Status: DC | PRN
  Administered 2023-12-12: 4 mg via INTRAVENOUS
  Filled 2023-12-12: qty 2

## 2023-12-12 MED ORDER — ONDANSETRON HCL 4 MG PO TABS: 4.0000 mg | ORAL_TABLET | Freq: Four times a day (QID) | ORAL | Status: DC | PRN

## 2023-12-12 NOTE — Plan of Care (Signed)

## 2023-12-12 NOTE — Progress Notes (Signed)
 Physical Therapy Treatment Patient Details Name: Angela Morrison MRN: [id number] DOB: 10/07/1950 Today's Date: 12/12/2023   History of Present Illness 73 year old female presenting to ED 11/01 with weakness and at least 4 falls in the last 24 hours, as well as inability to care for herself in the home. Labs notable for WBC 12.2. UA w/ pyuria and bacteruira. CT cervical spine without contrast showed a nonspecific neck mass. 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    PMH: HTN, HLD, CAD, obesity, DM2, depression, arthritis, vitamin D deficiency, vascular dementia    PT Comments  Completed vestibular evaluation as noted below.  Notable for symptoms with mobility most likely due to central cause with recent acute/subacute CVAs as noted above.  She was able to mobilize to standing today though does complain of dizziness.  Feel she will need continued skilled PT in the acute setting and post-acute inpatient rehab (<3 hours/day) to encourage mobility despite symptoms for habituation and accomodation.  Recommend encouraging visual compensatory techniques throughout though pt not consistently following today.  PT will follow.   Vestibular Assessment - 12/12/23 0001       Symptom Behavior   Subjective history of current problem reports dizziness (wooziness) not new, but cannot specify timeline or initiating factors.  Patient with recent CVA and neck mass identified on CT scans.    Type of Dizziness  Lightheadedness;Imbalance    Frequency of Dizziness intermittent    Duration of Dizziness minutes    Symptom Nature Variable;Intermittent;Motion provoked    Aggravating Factors Activity in general    Relieving Factors Rest;Lying supine    Progression of Symptoms No change since onset    History of similar episodes not sure      Oculomotor Exam   Oculomotor Alignment  Normal    Ocular ROM WNL    Spontaneous Absent    Gaze-induced  Absent    Smooth Pursuits Intact   though makes her more symptomatic   Saccades Intact   though makes her more symptomatic     Oculomotor Exam-Fixation Suppressed    Left Head Impulse negative for refixation    Right Head Impulse negative for refixation      Vestibulo-Ocular Reflex   VOR 1 Head Only (x 1 viewing) with constant cues can keey eyes on the target with assisted head rotation      Auditory   Comments intact and equal to scratch test bilaterally      Positional Testing   Dix-Hallpike Dix-Hallpike Right;Dix-Hallpike Left    Horizontal Canal Testing Horizontal Canal Right;Horizontal Canal Left      Dix-Hallpike Right   Dix-Hallpike Right Duration 30 sec   in position using bed features   Dix-Hallpike Right Symptoms No nystagmus      Dix-Hallpike Left   Dix-Hallpike Left Duration 30 sec   in position using bed features   Dix-Hallpike Left Symptoms No nystagmus      Horizontal Canal Right   Horizontal Canal Right Duration 30 + seconds    Horizontal Canal Right Symptoms Normal      Horizontal Canal Left   Horizontal Canal Left Duration 30 + seconds    Horizontal Canal Left Symptoms Normal            If plan is discharge home, recommend the following: A lot of help with walking and/or transfers;A lot of help with bathing/dressing/bathroom;Assistance with cooking/housework;Direct  supervision/assist for medications management;Direct supervision/assist for financial management;Assist for transportation;Help with stairs or ramp for entrance;Supervision due to cognitive status   Can travel by private vehicle     No  Equipment Recommendations       Recommendations for Other Services       Precautions / Restrictions Precautions Precautions: Fall Precaution/Restrictions Comments: multiple falls immediately prior to hospitalization     Mobility  Bed Mobility Overal bed mobility: Needs  Assistance Bed Mobility: Supine to Sit, Sit to Supine     Supine to sit: Mod assist, HOB elevated, Used rails Sit to supine: Mod assist   General bed mobility comments: up to EOB with A for lifting trunk and scooting hips, to supine with A for legs onto bed then +2 to scoot to [facility] after attempts for pt to help to scoot up    Transfers Overall transfer level: Needs assistance Equipment used: Rolling walker (2 wheels) Transfers: Sit to/from Stand Sit to Stand: Min assist, From elevated surface           General transfer comment: min A to stand to RW from elevated bed height    Ambulation/Gait Ambulation/Gait assistance: Min assist Gait Distance (Feet): 2 Feet Assistive device: Rolling walker (2 wheels) Gait Pattern/deviations: Step-to pattern, Decreased stride length       General Gait Details: side stepped to [facility] with mod cues and A for balance and moving walker; limited with c/o dizziness and (when asked) nausea   Stairs             Wheelchair Mobility     Tilt Bed    Modified Rankin (Stroke Patients Only) Modified Rankin (Stroke Patients Only) Pre-Morbid Rankin Score: Moderate disability Modified Rankin: Moderately severe disability     Balance Overall balance assessment: Needs assistance Sitting-balance support: Feet supported Sitting balance-Leahy Scale: Fair     Standing balance support: Bilateral upper extremity supported Standing balance-Leahy Scale: Poor                              Communication Communication Communication: No apparent difficulties  Cognition Arousal: Alert Behavior During Therapy: Flat affect   PT - Cognitive impairments: History of cognitive impairments                         Following commands: Impaired Following commands impaired: Follows one step commands inconsistently, Follows one step commands with increased time    Cueing Cueing Techniques: Verbal cues, Gestural cues  Exercises       General Comments General comments (skin integrity, edema, etc.): vestibular evaluation performed, see flowsheet      Pertinent Vitals/Pain Pain Assessment Pain Assessment: No/denies pain    Home Living                          Prior Function            PT Goals (current goals can now be found in the care plan section) Progress towards PT goals: Progressing toward goals    Frequency    Min 2X/week      PT Plan      Co-evaluation              AM-PAC PT 6 Clicks Mobility   Outcome Measure  Help needed turning from your back to your side while in a flat bed without using bedrails?: A Little Help needed  moving from lying on your back to sitting on the side of a flat bed without using bedrails?: A Lot Help needed moving to and from a bed to a chair (including a wheelchair)?: A Lot Help needed standing up from a chair using your arms (e.g., wheelchair or bedside chair)?: A Lot Help needed to walk in hospital room?: Total Help needed climbing 3-5 steps with a railing? : Total 6 Click Score: 11    End of Session Equipment Utilized During Treatment: Gait belt Activity Tolerance: Patient limited by fatigue;Other (comment) (dizziness) Patient left: in bed;with call bell/phone within reach   PT Visit Diagnosis: Other abnormalities of gait and mobility (R26.89);History of falling (Z91.81);Dizziness and giddiness (R42);Other symptoms and signs involving the nervous system (R29.898)     Time: 8479-8442 PT Time Calculation (min) (ACUTE ONLY): 37 min  Charges:    $Therapeutic Activity: 8-22 mins $Physical Performance Test: 8-22 mins PT General Charges $$ ACUTE PT VISIT: 1 Visit                     Micheline Portal, PT Acute Rehabilitation Services Office:[phone] 12/12/2023    Montie Portal 12/12/2023, 5:48 PM

## 2023-12-12 NOTE — Progress Notes (Signed)
 PROGRESS NOTE    Angela Morrison  FMW:[id number] DOB: 11-22-1950 DOA: 12/09/2023 PCP: Aisha Harvey, MD   Brief Narrative: This 73 years old female with PMH significant for hypertension, COPD, dementia presented in the ED for further evaluation of altered mental status related to her baseline dementia for 2 to 3 days.  Patient presented here with worsening confusion and multiple falls without loss of consciousness, although family notes that few of these falls have been unwitnessed.  Family also reports patient has exhibited evidence of left lower extremity weakness resulting in some dragging of left leg for greater than a month.  In the ED UA consistent with pyuria and bacteriuria.  CT head showed no evidence of acute intracranial abnormality.  CT cervical spine showed nonspecific neck mass. Patient was admitted for further evaluation and ENT is consulted for neck mass.  Assessment & Plan:   Principal Problem:   Acute encephalopathy Active Problems:   Neck mass  Acute encephalopathy:  > Improved. Likely Acute cystitis: Continue IV ceftriaxone. Urine cultures contaminated.  May DC antibiotics after 3 days Discontinue IV hydration.   Recurrent falls: High suspicion for deconditioning; No obvious focal deficits on exam. 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   Neck mass: ENT consulted (Dr. Penne Croak); apprec eval/recs. Outpatient follow-up recommended.   HTN: Continue Irbesartan 75mg  daily (in lieu pta valsartan 80mg  daily).   Hyperlipidemia:  -PTA atorvastatin .   COPD: -Duonebs prn  DVT prophylaxis: SCDs Code Status: Full code Family Communication: No family at bed side. Disposition Plan:    Status is: Inpatient Remains inpatient appropriate because: Patient is medically clear,  awaiting placement in acute inpatient rehab.  Consultants:  None  Procedures:  MRI Brain.  Antimicrobials:  Anti-infectives (From  admission, onward)    Start     Dose/Rate Route Frequency Ordered Stop   12/10/23 1100  cefTRIAXone (ROCEPHIN) 1 g in sodium chloride  0.9 % 100 mL IVPB        1 g 200 mL/hr over 30 Minutes Intravenous Every 24 hours 12/10/23 1009 12/15/23 1059      Subjective: Patient was seen and examined at bedside.  Overnight events noted. Patient seems much improved,  following commands.  Denies any concerns.  Objective: Vitals:   12/11/23 2341 12/12/23 0437 12/12/23 0500 12/12/23 0837  BP: (!) 150/83 (!) 160/89  (!) 165/91  Pulse: 86 81  84  Resp:    20  Temp: 98.5 F (36.9 C) 98.3 F (36.8 C)  98 F (36.7 C)  TempSrc:      SpO2: 97% 97%  96%  Weight:   91.1 kg   Height:        Intake/Output Summary (Last 24 hours) at 12/12/2023 1159 Last data filed at 12/12/2023 0500 Gross per 24 hour  Intake 0 ml  Output --  Net 0 ml   Filed Weights   12/10/23 0617 12/11/23 0500 12/12/23 0500  Weight: 93.4 kg 91.9 kg 91.1 kg    Examination:  General exam: Appears calm and comfortable, deconditioned , not in any acute distress. Respiratory system: CTA Bilaterally . Respiratory effort normal.  RR 14 Cardiovascular system: S1 & S2 heard, RRR. No JVD, murmurs, rubs, gallops or clicks.  Gastrointestinal system: Abdomen is non distended, soft and non tender.  Normal bowel sounds heard. Central nervous system: Alert and oriented x 2. No focal neurological deficits. Extremities: No edema, no cyanosis, no clubbing. Skin: No rashes, lesions or ulcers Psychiatry: Judgement and  insight appear normal. Mood & affect appropriate.   Data Reviewed: I have personally reviewed following labs and imaging studies  CBC: Recent Labs  Lab 12/09/23 1546  WBC 12.2*  NEUTROABS 9.1*  HGB 12.4  HCT 38.6  MCV 89.8  PLT 221   Basic Metabolic Panel: Recent Labs  Lab 12/09/23 1546  NA 141  K 4.4  CL 104  CO2 27  GLUCOSE 133*  BUN 18  CREATININE 1.03*  CALCIUM  10.7*  MG 1.8   GFR: Estimated  Creatinine Clearance: 53.2 mL/min (A) (by C-G formula based on SCr of 1.03 mg/dL (H)). Liver Function Tests: Recent Labs  Lab 12/09/23 1546  AST 40  ALT 16  ALKPHOS 162*  BILITOT 0.9  PROT 7.8  ALBUMIN 4.0   No results for input(s): LIPASE, AMYLASE in the last 168 hours. No results for input(s): AMMONIA in the last 168 hours. Coagulation Profile: No results for input(s): INR, PROTIME in the last 168 hours. Cardiac Enzymes: No results for input(s): CKTOTAL, CKMB, CKMBINDEX, TROPONINI in the last 168 hours. BNP (last 3 results) No results for input(s): PROBNP in the last 8760 hours. HbA1C: No results for input(s): HGBA1C in the last 72 hours. CBG: Recent Labs  Lab 12/09/23 1552  GLUCAP 128*   Lipid Profile: No results for input(s): CHOL, HDL, LDLCALC, TRIG, CHOLHDL, LDLDIRECT in the last 72 hours. Thyroid  Function Tests: Recent Labs    12/09/23 1546 12/09/23 1843  TSH 0.848  --   FREET4  --  1.12   Anemia Panel: No results for input(s): VITAMINB12, FOLATE, FERRITIN, TIBC, IRON, RETICCTPCT in the last 72 hours. Sepsis Labs: No results for input(s): PROCALCITON, LATICACIDVEN in the last 168 hours.  No results found for this or any previous visit (from the past 240 hours).   Radiology Studies: No results found.   Scheduled Meds:  atorvastatin   80 mg Oral q morning   ezetimibe   10 mg Oral Daily   irbesartan  75 mg Oral Daily   Continuous Infusions:  cefTRIAXone (ROCEPHIN)  IV 1 g (12/12/23 1025)     LOS: 2 days    Time spent: 35 mins    Darcel Dawley, MD [facility]   If 7PM-7AM, please contact night-coverage

## 2023-12-13 DIAGNOSIS — G9340 Encephalopathy, unspecified: Secondary | ICD-10-CM | POA: Diagnosis not present

## 2023-12-13 MED ORDER — SENNOSIDES-DOCUSATE SODIUM 8.6-50 MG PO TABS
1.0000 | ORAL_TABLET | Freq: Two times a day (BID) | ORAL | Status: DC
  Administered 2023-12-13 – 2023-12-24 (×18): 1 via ORAL
  Filled 2023-12-13 (×20): qty 1

## 2023-12-13 NOTE — Progress Notes (Signed)
 Mobility Specialist: Progress Note   12/13/23 1500  Mobility  Activity Ambulated with assistance;Pivoted/transferred to/from [facility]  Level of Assistance Contact guard assist, steadying assist  Assistive Device Front wheel walker  Distance Ambulated (ft) 5 ft  Activity Response Tolerated well  Mobility Referral Yes  Mobility visit 1 Mobility  Mobility Specialist Start Time (ACUTE ONLY) 1112  Mobility Specialist Stop Time (ACUTE ONLY) 1133  Mobility Specialist Time Calculation (min) (ACUTE ONLY) 21 min    Pt received in chair requesting assistance to [facility] and back to bed. ModA for STS from chair, stand pivot to [facility] with CGA. Afterwards, stood from [facility] with minA and completed short bout of ambulation to bed. ModA for sit>supine to assist LLE back into bed. Left in bed with all needs met, call bell in reach. Bed alarm on.   Ileana Lute Mobility Specialist Please contact via SecureChat or Rehab office at [phone]

## 2023-12-13 NOTE — Plan of Care (Signed)

## 2023-12-13 NOTE — Progress Notes (Signed)
 Please be advised that this patient, Angela Morrison, will require a short-term nursing home stay - anticipated 30 days or less for rehabilitation and strengthening.  The plan is for return home.

## 2023-12-13 NOTE — Progress Notes (Addendum)
 Inpatient Rehab Coordinator Note:  I met with patient at bedside to discuss CIR recommendations and goals/expectations of CIR stay.  We reviewed 3 hrs/day of therapy, physician follow up, and average length of stay 2 weeks (dependent upon progress) with goals of supervision.  She reports she's home with her spouse and daughter who can provide 24/7 supervision.  We discussed insurance auth and I can start that process today.  I will also try and reach family to discuss above.   1353: Updated spouse via phone and he is in agreement to pursue CIR.  Will start auth.   Reche Lowers, PT, DPT Admissions Coordinator [phone] 12/13/23 12:53 PM

## 2023-12-13 NOTE — Plan of Care (Signed)

## 2023-12-13 NOTE — Consult Note (Signed)
 Physical Medicine and Rehabilitation Consult Referring Physician: Darcel Sniff, MD Reason for consult: Acute encephalopathy   HPI: Angela Morrison is a 73 y.o. female who presented to the ED on 11/1 with weakness and at least 4 falls in the last 24 hours as well as inability to care for herself at home. Labs were notable for WBC 12.2. UA showed pyuria and bacteruria. CT cervical spine without contrast showed a nonspecific neck mass. MRI revealed an acute subcentimeter infarct in the posterior limb of the right internal capsule and subacute infarcts in the left external capsule and left occipital white matter. PMH is significant for HTN, HLD, CAD, obesity, T2DM, depression, arthritis, vitamin D deficiency, and vascular dementia. Physical Medicine & Rehabilitation was consulted to assess candidacy for CIR.  She is current modA for bed mobility, MinA for trnasfers, and can ambulate MinA 1 feet with RW.    Home: Home Living Family/patient expects to be discharged to:: Private residence Living Arrangements: Spouse/significant other, Children Available Help at Discharge: Family, Available 24 hours/day Type of Home: House Home Access: Stairs to enter [employer] of Steps: 3 Entrance Stairs-Rails: Can reach both Home Layout: Two level Bathroom Shower/Tub: Tub/shower unit (Garden shower) Bathroom Toilet: Standard Bathroom Accessibility: No Home Equipment: Grab bars - tub/shower, Hand held shower head Additional Comments: bedroom on first floor  Functional History: Prior Function Prior Level of Function : Independent/Modified Independent Mobility Comments: ambulates community distances without AD, and drives ADLs Comments: independent with ADLs, laundry. husband cooks. pt reports managing her own meds Functional Status:  Mobility: Bed Mobility Overal bed mobility: Needs Assistance Bed Mobility: Supine to Sit, Sit to Supine Supine to sit: Mod assist, HOB elevated, Used  rails Sit to supine: Mod assist General bed mobility comments: up to EOB with A for lifting trunk and scooting hips, to supine with A for legs onto bed then +2 to scoot to [facility] after attempts for pt to help to scoot up Transfers Overall transfer level: Needs assistance Equipment used: Rolling walker (2 wheels) Transfers: Sit to/from Stand Sit to Stand: Min assist, From elevated surface General transfer comment: min A to stand to RW from elevated bed height Ambulation/Gait Ambulation/Gait assistance: Min assist Gait Distance (Feet): 2 Feet Assistive device: Rolling walker (2 wheels) Gait Pattern/deviations: Step-to pattern, Decreased stride length General Gait Details: side stepped to [facility] with mod cues and A for balance and moving walker; limited with c/o dizziness and (when asked) nausea Gait velocity: slowed Gait velocity interpretation: <1.31 ft/sec, indicative of household ambulator    ADL: ADL Overall ADL's : Needs assistance/impaired Eating/Feeding: Independent Grooming: Contact guard assist, Standing Upper Body Bathing: Minimal assistance, Sitting Lower Body Bathing: Maximal assistance, Sitting/lateral leans, Sit to/from stand Upper Body Dressing : Minimal assistance, Sitting Lower Body Dressing: Maximal assistance, Sit to/from stand, Sitting/lateral leans Toilet Transfer: Minimal assistance, +2 for safety/equipment, Ambulation, Regular Toilet, Moderate assistance, Rolling walker (2 wheels) Toilet Transfer Details (indicate cue type and reason): Min A x 2 for safety to ambulate to/from bathroom with some cues/assist needed for RW negotiation. Pt required Mod A x 2 for safety to stand from lower height toilet w/ use of grab bar Toileting- Clothing Manipulation and Hygiene: Sitting/lateral lean, Sit to/from stand, Moderate assistance Toileting - Clothing Manipulation Details (indicate cue type and reason): assist for posterior hygiene in standing. able to perform anterior hygiene  seated on toilet Functional mobility during ADLs: Minimal assistance, +2 for safety/equipment, Rolling walker (2 wheels)  Cognition: Cognition  Orientation Level: Oriented to person, Oriented to place, Disoriented to time, Disoriented to situation Cognition Arousal: Alert Behavior During Therapy: Flat affect   ROS: +fatigue and dizziness Past Medical History:  Diagnosis Date   Anemia    few yrs ago none recent   Arthritis    Constipation    Coronary artery disease 2005   S/P PCI of RCA   Depression    Diabetes mellitus without complication (HCC)    type 2   Edema, lower extremity    History of MI (myocardial infarction)    Hyperlipidemia    Hypertension    Leg cramps    Lower back pain    Obesity    PONV (postoperative nausea and vomiting)    Shoulder pain    Thyroid  nodule    S/P bx 8/13 c/w goiter   Vitamin D deficiency    Weakness    Past Surgical History:  Procedure Laterality Date   appendectomy Right    BACK SURGERY     lower back   CHOLECYSTECTOMY N/A 07/02/2017   Procedure: LAPAROSCOPIC CHOLECYSTECTOMY WITH INTRAOPERATIVE CHOLANGIOGRAM;  Surgeon: Mikell Katz, MD;  Location: [facility] ORS;  Service: General;  Laterality: N/A;   COLONOSCOPY WITH PROPOFOL  N/A 05/17/2016   Procedure: COLONOSCOPY WITH PROPOFOL ;  Surgeon: Gladis MARLA Louder, MD;  Location: [facility] ENDOSCOPY;  Service: Endoscopy;  Laterality: N/A;   Family History  Problem Relation Age of Onset   Alzheimer's disease Mother    Hypertension Mother    Dementia Father    Hypertension Father    Alcoholism Father    Social History:  reports that she has never smoked. She has never used smokeless tobacco. She reports current alcohol use. She reports that she does not use drugs. Allergies:  Allergies  Allergen Reactions   Crestor [Rosuvastatin]     constipation   Morphine  Sulfate Other (See Comments)   Penicillins Itching   Medications Prior to Admission  Medication Sig Dispense Refill   atorvastatin   (LIPITOR) 80 MG tablet TAKE ONE TABLET BY MOUTH EVERY MORNING 30 tablet 0   ezetimibe  (ZETIA ) 10 MG tablet Take 10 mg by mouth daily.     potassium chloride  (KLOR-CON ) 10 MEQ tablet TAKE ONE TABLET BY MOUTH every morning 15 tablet 0   valsartan (DIOVAN) 80 MG tablet Take 80 mg by mouth daily.       Blood pressure (!) 145/77, pulse 77, temperature 98.5 F (36.9 C), resp. rate 19, height 5' 4 (1.626 m), weight 93.8 kg, SpO2 95%. Physical Exam Gen: no distress, normal appearing HEENT: oral mucosa pink and moist, NCAT Cardio: Reg rate Chest: normal effort, normal rate of breathing Abd: soft, non-distended Ext: no edema Psych: pleasant, normal affect Skin: intact Neuro: Alert and oriented x1, no focal deficits  No results found for this or any previous visit (from the past 24 hours). No results found.  Assessment/Plan: Diagnosis: Encephalopathy Does the need for close, 24 hr/day medical supervision in concert with the patient's rehab needs make it unreasonable for this patient to be served in a less intensive setting? Yes Co-Morbidities requiring supervision/potential complications:  1) acute cystitis: continue OV ceftriaxone 2) recurrent falls: would benefit from intensive PT and OT 3) Neck mass: ENT consulted 4) HTN: continue irbesartan 5) HLD: continue atorvastatin  Due to bladder management, bowel management, safety, skin/wound care, disease management, medication administration, pain management, and patient education, does the patient require 24 hr/day rehab nursing? Yes Does the patient require coordinated care of a physician, rehab nurse,  therapy disciplines of PT, OT, SLP to address physical and functional deficits in the context of the above medical diagnosis(es)? Yes Addressing deficits in the following areas: balance, endurance, locomotion, strength, transferring, bowel/bladder control, bathing, dressing, feeding, grooming, cognition, and psychosocial support Can the patient  actively participate in an intensive therapy program of at least 3 hrs of therapy per day at least 5 days per week? Yes The potential for patient to make measurable gains while on inpatient rehab is excellent Anticipated functional outcomes upon discharge from inpatient rehab are supervision  with PT, supervision with OT, supervision with SLP. Estimated rehab length of stay to reach the above functional goals is: 10-14 days Anticipated discharge destination: Home Overall Rehab/Functional Prognosis: excellent  POST ACUTE RECOMMENDATIONS: This patient's condition is appropriate for continued rehabilitative care in the following setting: CIR Patient has agreed to participate in recommended program. Yes Note that insurance prior authorization may be required for reimbursement for recommended care.    I have personally performed a face to face diagnostic evaluation of this patient. Additionally, I have examined the patient's medical record including any pertinent labs and radiographic images.    Thanks,  Sven SHAUNNA Elks, MD 12/13/2023

## 2023-12-13 NOTE — TOC Initial Note (Addendum)
 Transition of Care [facility]) - Initial/Assessment Note    Patient Details  Name: Angela Morrison MRN: [id number] Date of Birth: [birth year]  Transition of Care [facility]) CM/SW Contact:    Sherline Clack, LCSWA Phone Number: 12/13/2023, 3:16 PM  Clinical Narrative:                  Update 3:33 PM: patient's daughter would like patient to be faxed out to SNF facilities. CSW will fill out FL2 and fax out to facilities in the hub.   CSW met with patient at bedside to discuss anticipated discharge plan. Patient is not currently interested in SNF and is being evaluated by CIR. CSW will continue to follow in case discharge plan changes to SNF.        Patient Goals and CMS Choice            Expected Discharge Plan and Services                                              Prior Living Arrangements/Services                       Activities of Daily Living   ADL Screening (condition at time of admission) Independently performs ADLs?: Yes (appropriate for developmental age) Is the patient deaf or have difficulty hearing?: Yes Does the patient have difficulty seeing, even when wearing glasses/contacts?: Yes Does the patient have difficulty concentrating, remembering, or making decisions?: Yes  Permission Sought/Granted                  Emotional Assessment              Admission diagnosis:  Neck mass [R22.1] Weakness [R53.1] Acute encephalopathy [G93.40] Falls [R29.6] Altered mental status, unspecified altered mental status type [R41.82] Fatigue, unspecified type [R53.83] Patient Active Problem List   Diagnosis Date Noted   Neck mass 12/10/2023   Acute encephalopathy 12/09/2023   Type 2 diabetes mellitus with complication, without long-term current use of insulin  (HCC) 05/20/2020   Class 3 severe obesity with serious comorbidity and body mass index (BMI) of 45.0 to 49.9 in adult (HCC) 06/10/2019   Snoring 06/10/2019   Excessive  daytime sleepiness 06/10/2019   Hyperlipidemia 12/19/2017   Cholelithiasis and acute cholecystitis without obstruction 07/02/2017   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 ([address]), [place] - [address] [place] ([place]) [place] [address] Phone: [phone] Fax: [phone]     Social Drivers of Health (SDOH) Social History: SDOH Screenings   Depression (PHQ2-9): Medium Risk (05/23/2019)  Tobacco Use: Low Risk  (12/09/2023)   SDOH Interventions:     Readmission Risk Interventions     No data to display

## 2023-12-13 NOTE — Progress Notes (Signed)
 Occupational Therapy Treatment Patient Details Name: Angela Morrison MRN: [id number] DOB: [birth year] Today's Date: 12/13/2023   History of present illness 72 year old female presenting to ED 11/01 with weakness and at least 4 falls in the last 24 hours, as well as inability to care for herself in the home. Labs notable for WBC 12.2. UA w/ pyuria and bacteruira. CT cervical spine without contrast showed a nonspecific neck mass. 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    PMH: HTN, HLD, CAD, obesity, DM2, depression, arthritis, vitamin D deficiency, vascular dementia   OT comments  Patient demonstrating good gains with OT treatment. Patient able to get to EOB with mod assist but required tactile cues to use LUE with bed rail. Patient was mod assist to stand from EOB and to transfer to recliner. Patient taken to sink for grooming tasks and was able to stand with use of recliner arm rest with min assist.  LB dressing performed with patient able to doff socks and required mod assist to donn.  Patient will benefit from intensive inpatient follow-up therapy, >3 hours/day.  Acute OT to continue to follow to address established goals to facilitate DC to next venue of care.        If plan is discharge home, recommend the following:  A lot of help with walking and/or transfers;A lot of help with bathing/dressing/bathroom;Assistance with cooking/housework;Direct supervision/assist for medications management;Direct supervision/assist for financial management;Assist for transportation   Equipment Recommendations  Other (comment);BSC/3in1 (RW; TBD)    Recommendations for Other Services      Precautions / Restrictions Precautions Precautions: Fall Precaution/Restrictions Comments: multiple falls immediately prior to hospitalization Restrictions Weight Bearing Restrictions  Per Provider Order: No       Mobility Bed Mobility Overal bed mobility: Needs Assistance Bed Mobility: Supine to Sit     Supine to sit: Mod assist, HOB elevated, Used rails     General bed mobility comments: verbal cues to use rail and tactile cues    Transfers Overall transfer level: Needs assistance Equipment used: Rolling walker (2 wheels) Transfers: Sit to/from Stand, Bed to chair/wheelchair/BSC Sit to Stand: Mod assist, From elevated surface     Step pivot transfers: Mod assist     General transfer comment: cues for hand placement and mod assist to stand and pivot to recliner. Patient stood at sink for grooming with min assist     Balance Overall balance assessment: Needs assistance Sitting-balance support: Feet supported Sitting balance-Leahy Scale: Fair     Standing balance support: Single extremity supported, Bilateral upper extremity supported, During functional activity Standing balance-Leahy Scale: Poor Standing balance comment: reliant on RW or sink for support                           ADL either performed or assessed with clinical judgement   ADL Overall ADL's : Needs assistance/impaired     Grooming: Wash/dry hands;Wash/dry face;Contact guard assist;Standing Grooming Details (indicate cue type and reason): at sink         Upper Body Dressing : Minimal assistance;Sitting Upper Body Dressing Details (indicate cue type and reason): gown change Lower Body Dressing: Moderate assistance;Sitting/lateral leans Lower Body Dressing Details (indicate cue type and reason): to change socks Toilet Transfer: Moderate assistance;Rolling walker (2 wheels) Toilet Transfer Details (indicate cue type and reason): simulated to recliner  Extremity/Trunk Assessment              Vision       Restaurant Manager, [employer] Communication: No apparent difficulties   Cognition Arousal: Alert Behavior  During Therapy: Flat affect Cognition: Cognition impaired   Orientation impairments: Time, Situation Awareness: Intellectual awareness intact, Online awareness impaired Memory impairment (select all impairments): Working memory Attention impairment (select first level of impairment): Selective attention Executive functioning impairment (select all impairments): Organization, Problem solving OT - Cognition Comments: hx of dementia                 Following commands: Impaired Following commands impaired: Follows one step commands inconsistently, Follows one step commands with increased time      Cueing   Cueing Techniques: Verbal cues, Gestural cues  Exercises      Shoulder Instructions       General Comments VSS on RA    Pertinent Vitals/ Pain       Pain Assessment Pain Assessment: No/denies pain  Home Living                                          Prior Functioning/Environment              Frequency  Min 2X/week        Progress Toward Goals  OT Goals(current goals can now be found in the care plan section)  Progress towards OT goals: Progressing toward goals  Acute Rehab OT Goals Patient Stated Goal: none stated OT Goal Formulation: With patient Time For Goal Achievement: 12/25/23 Potential to Achieve Goals: Good ADL Goals Pt Will Perform Lower Body Bathing: with contact guard assist;sitting/lateral leans;sit to/from stand Pt Will Perform Lower Body Dressing: with contact guard assist;sit to/from stand;sitting/lateral leans Pt Will Transfer to Toilet: with contact guard assist;ambulating Additional ADL Goal #1: Pt to increase standing tolerance > 6 min during ADLs/mobility without need for seated rest break  Plan      Co-evaluation                 AM-PAC OT 6 Clicks Daily Activity     Outcome Measure   Help from another person eating meals?: None Help from another person taking care of personal grooming?: A  Little Help from another person toileting, which includes using toliet, bedpan, or urinal?: A Lot Help from another person bathing (including washing, rinsing, drying)?: A Lot Help from another person to put on and taking off regular upper body clothing?: A Little Help from another person to put on and taking off regular lower body clothing?: A Lot 6 Click Score: 16    End of Session Equipment Utilized During Treatment: Gait belt;Rolling walker (2 wheels)  OT Visit Diagnosis: Unsteadiness on feet (R26.81);Other abnormalities of gait and mobility (R26.89);Muscle weakness (generalized) (M62.81);Other symptoms and signs involving cognitive function   Activity Tolerance Patient tolerated treatment well   Patient Left in chair;with call bell/phone within reach;with chair alarm set   Nurse Communication Mobility status        Time: 9042-8980 OT Time Calculation (min): 22 min  Charges: OT General Charges $OT Visit: 1 Visit OT Treatments $Self Care/Home Management : 8-22 mins  Dick Laine, OTA Acute Rehabilitation Services  Office [phone]   Jeb LITTIE Laine 12/13/2023, 2:10 PM

## 2023-12-13 NOTE — Care Management Important Message (Signed)
 Important Message  Patient Details  Name: BRISA AUTH MRN: [id number] Date of Birth: 10-13-1950   Important Message Given:  Yes - Medicare IM     Claretta Deed 12/13/2023, 3:12 PM

## 2023-12-13 NOTE — Progress Notes (Signed)
 Transition of Care [facility]) - Inpatient Brief Assessment   Patient Details  Name: Angela Morrison MRN: [id number] Date of Birth: 27-Jul-1950  Transition [facility] Care [facility]) CM/SW Contact:    Rosaline JONELLE Joe, RN Phone Number: 12/13/2023, 3:42 PM   Clinical Narrative: CM met with the patient at the bedside alone with the patient's daughter, [name] and daughter states that patient's spouse is unable to take care of the patient at home since he just had a total knee arthroplasty last week and is currently being cared for at home by two step brothers.  The daughter states that she would prefer SNF placement and not CIr since the patient will not have 24 hour care and assistance.  Butler, CM with CIR and MD were both updated.  The patient has vascular dementia and is unable to assist with care decisions.  The daughter states that she will speak with her father and update him regarding patient's need for SNF placement.  Patient has no DME at home.  FL2 will be completed and patient faxed out for bed offers.  Sherline, MSW to follow up with the daughter for bed offers tomorrow.  I asked attending MD to call the daughter for a medical update.   Transition of Care Asessment: Insurance and Status: Insurance coverage has been reviewed Patient has primary care physician: Yes Home environment has been reviewed: from home with spouse Prior level of function:: family assistance Prior/Current Home Services: No current home services Social Drivers of Health Review: SDOH reviewed needs interventions Readmission risk has been reviewed: Yes Transition of care needs: transition of care needs identified, TOC will continue to follow

## 2023-12-13 NOTE — Progress Notes (Signed)
 PROGRESS NOTE    Angela Morrison  FMW:[id number] DOB: [birth year] DOA: 12/09/2023 PCP: Aisha Harvey, MD   Brief Narrative: This 73 years old female with PMH significant for hypertension, COPD, dementia presented in the ED for further evaluation of altered mental status related to her baseline dementia for 2 to 3 days.  Patient presented here with worsening confusion and multiple falls without loss of consciousness, although family notes that few of these falls have been unwitnessed.  Family also reports patient has exhibited evidence of left lower extremity weakness resulting in some dragging of left leg for greater than a month.  In the ED UA consistent with pyuria and bacteriuria.  CT head showed no evidence of acute intracranial abnormality.  CT cervical spine showed nonspecific neck mass. Patient was admitted for further evaluation and ENT is consulted for neck mass.  Assessment & Plan:   Principal Problem:   Acute encephalopathy Active Problems:   Neck mass  Acute encephalopathy:  > Improved. Likely Acute cystitis: Initiated on IV ceftriaxone. Urine cultures contaminated.  May DC antibiotics after 3 days Discontinue IV hydration.   Recurrent falls: High suspicion for deconditioning; No obvious focal deficits on exam. 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   Neck mass: ENT consulted (Dr. Penne Croak); apprec eval/recs. Outpatient follow-up recommended.   HTN: Continue Irbesartan 75mg  daily (in lieu pta valsartan 80mg  daily).   Hyperlipidemia:  -PTA atorvastatin .   COPD: -Duonebs prn  DVT prophylaxis: SCDs Code Status: Full code Family Communication: No family at bed side. Disposition Plan:    Status is: Inpatient Remains inpatient appropriate because: Patient is medically clear,  awaiting placement in acute inpatient rehab.  Consultants:  None  Procedures:  MRI Brain.  Antimicrobials:  Anti-infectives (From  admission, onward)    Start     Dose/Rate Route Frequency Ordered Stop   12/10/23 1100  cefTRIAXone (ROCEPHIN) 1 g in sodium chloride  0.9 % 100 mL IVPB        1 g 200 mL/hr over 30 Minutes Intravenous Every 24 hours 12/10/23 1009 12/15/23 1059      Subjective: Patient was seen and examined at bedside.  Overnight events noted. Patient seems much improved,  following commands.  Patient reports she is awaiting placement in a rehab facility.  Objective: Vitals:   12/13/23 0333 12/13/23 0417 12/13/23 0500 12/13/23 0810  BP: (!) 151/98 133/75  (!) 145/77  Pulse: (!) 34 84  77  Resp:    19  Temp: 98.8 F (37.1 C) 98.4 F (36.9 C)  98.5 F (36.9 C)  TempSrc:      SpO2: 100% 98%  95%  Weight:   93.8 kg   Height:        Intake/Output Summary (Last 24 hours) at 12/13/2023 1121 Last data filed at 12/12/2023 2100 Gross per 24 hour  Intake 0 ml  Output --  Net 0 ml   Filed Weights   12/11/23 0500 12/12/23 0500 12/13/23 0500  Weight: 91.9 kg 91.1 kg 93.8 kg    Examination:  General exam: Appears calm and comfortable, deconditioned , not in any acute distress. Respiratory system: CTA Bilaterally . Respiratory effort normal.  RR 13 Cardiovascular system: S1 & S2 heard, RRR. No JVD, murmurs, rubs, gallops or clicks.  Gastrointestinal system: Abdomen is non distended, soft and non tender.  Normal bowel sounds heard. Central nervous system: Alert and oriented x 2. No focal neurological deficits. Extremities: No edema, no cyanosis, no clubbing. Skin:  No rashes, lesions or ulcers Psychiatry: Judgement and insight appear normal. Mood & affect appropriate.   Data Reviewed: I have personally reviewed following labs and imaging studies  CBC: Recent Labs  Lab 12/09/23 1546  WBC 12.2*  NEUTROABS 9.1*  HGB 12.4  HCT 38.6  MCV 89.8  PLT 221   Basic Metabolic Panel: Recent Labs  Lab 12/09/23 1546  NA 141  K 4.4  CL 104  CO2 27  GLUCOSE 133*  BUN 18  CREATININE 1.03*   CALCIUM  10.7*  MG 1.8   GFR: Estimated Creatinine Clearance: 54 mL/min (A) (by C-G formula based on SCr of 1.03 mg/dL (H)). Liver Function Tests: Recent Labs  Lab 12/09/23 1546  AST 40  ALT 16  ALKPHOS 162*  BILITOT 0.9  PROT 7.8  ALBUMIN 4.0   No results for input(s): LIPASE, AMYLASE in the last 168 hours. No results for input(s): AMMONIA in the last 168 hours. Coagulation Profile: No results for input(s): INR, PROTIME in the last 168 hours. Cardiac Enzymes: No results for input(s): CKTOTAL, CKMB, CKMBINDEX, TROPONINI in the last 168 hours. BNP (last 3 results) No results for input(s): PROBNP in the last 8760 hours. HbA1C: No results for input(s): HGBA1C in the last 72 hours. CBG: Recent Labs  Lab 12/09/23 1552  GLUCAP 128*   Lipid Profile: No results for input(s): CHOL, HDL, LDLCALC, TRIG, CHOLHDL, LDLDIRECT in the last 72 hours. Thyroid  Function Tests: No results for input(s): TSH, T4TOTAL, FREET4, T3FREE, THYROIDAB in the last 72 hours.  Anemia Panel: No results for input(s): VITAMINB12, FOLATE, FERRITIN, TIBC, IRON, RETICCTPCT in the last 72 hours. Sepsis Labs: No results for input(s): PROCALCITON, LATICACIDVEN in the last 168 hours.  No results found for this or any previous visit (from the past 240 hours).   Radiology Studies: No results found.   Scheduled Meds:  atorvastatin   80 mg Oral q morning   ezetimibe   10 mg Oral Daily   irbesartan  75 mg Oral Daily   Continuous Infusions:  cefTRIAXone (ROCEPHIN)  IV 1 g (12/12/23 1025)     LOS: 3 days    Time spent: 35 mins    Darcel Dawley, MD [facility]   If 7PM-7AM, please contact night-coverage

## 2023-12-14 DIAGNOSIS — G9340 Encephalopathy, unspecified: Secondary | ICD-10-CM | POA: Diagnosis not present

## 2023-12-14 MED ORDER — POLYETHYLENE GLYCOL 3350 17 G PO PACK
17.0000 g | PACK | Freq: Every day | ORAL | Status: DC
  Administered 2023-12-14 – 2023-12-24 (×10): 17 g via ORAL
  Filled 2023-12-14 (×9): qty 1

## 2023-12-14 NOTE — Progress Notes (Signed)
 Inpatient Rehab Admissions Coordinator:   Notified by [facility] yesterday that pt's daughter does not feel pt's spouse can care for pt given recent TKA and having his brothers home to help him.  This was not relayed to me by patient or spouse yesterday.  Daughter prefers SNF at this time and pt is agreeable.  CIR will sign off at this time.  Please call me with questions.   Reche Lowers, PT, DPT Admissions Coordinator [phone] 12/14/23 10:37 AM

## 2023-12-14 NOTE — [place] FL2 (Addendum)
 [place]  MEDICAID FL2 LEVEL OF CARE FORM     IDENTIFICATION  Patient Name: Angela Morrison Birthdate: [birth year] Sex: female Admission Date (Current Location): 12/09/2023  [facility] and [place] Number:  Producer, Television/film/video and Address:  [facility], [address], [place], [place] [address]      Provider Number: [id number]  Attending Physician Name and Address:  Leotis Bogus, MD  Relative Name and Phone Number:       Current Level of Care: Hospital Recommended Level of Care: Skilled Nursing Facility Prior Approval Number:   Date Approved/Denied:   PASRR Number:  [id number]  Discharge Plan: SNF    Current Diagnoses: Patient Active Problem List   Diagnosis Date Noted   Neck mass 12/10/2023   Acute encephalopathy 12/09/2023   Type 2 diabetes mellitus with complication, without long-term current use of insulin  (HCC) 05/20/2020   Class 3 severe obesity with serious comorbidity and body mass index (BMI) of 45.0 to 49.9 in adult [facility]) 06/10/2019   Snoring 06/10/2019   Excessive daytime sleepiness 06/10/2019   Hyperlipidemia 12/19/2017   Cholelithiasis and acute cholecystitis without obstruction 07/02/2017   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   Coronary atherosclerosis of native coronary artery 06/21/2013   Essential hypertension, benign 06/21/2013   Encounter for long-term (current) use of other medications 06/21/2013    Orientation RESPIRATION BLADDER Height & Weight     Self, Place  Normal Continent, External catheter Weight: 206 lb 12.7 oz (93.8 kg) Height:  5' 4 (162.6 cm)  BEHAVIORAL SYMPTOMS/MOOD NEUROLOGICAL BOWEL NUTRITION STATUS      Incontinent Diet (See dc summary)  AMBULATORY STATUS COMMUNICATION OF NEEDS Skin   Limited Assist Verbally Normal                       Personal Care Assistance Level of Assistance  Bathing, Feeding, Dressing Bathing Assistance: Limited assistance Feeding assistance: Limited  assistance Dressing Assistance: Maximum assistance     Functional Limitations Info  Sight, Hearing, Speech Sight Info: Impaired (glasses) Hearing Info: Impaired Speech Info: Adequate    SPECIAL CARE FACTORS FREQUENCY  PT (By licensed PT), OT (By licensed OT)     PT Frequency: 5x/week OT Frequency: 5x/week            Contractures Contractures Info: Not present    Additional Factors Info  Code Status, Allergies Code Status Info: Full Allergies Info: Crestor (Rosuvastatin), Morphine  Sulfate, Penicillins           Current Medications (12/14/2023):  This is the current hospital active medication list Current Facility-Administered Medications  Medication Dose Route Frequency Provider Last Rate Last Admin   acetaminophen  (TYLENOL ) tablet 650 mg  650 mg Oral Q6H PRN Howerter, Justin B, DO   650 mg at 12/12/23 1215   Or   acetaminophen  (TYLENOL ) suppository 650 mg  650 mg Rectal Q6H PRN Howerter, Justin B, DO       atorvastatin  (LIPITOR) tablet 80 mg  80 mg Oral q morning Georgina Basket, MD   80 mg at 12/14/23 0941   cefTRIAXone (ROCEPHIN) 1 g in sodium chloride  0.9 % 100 mL IVPB  1 g Intravenous Q24H Moore, Willie, MD 200 mL/hr at 12/14/23 0944 1 g at 12/14/23 0944   ezetimibe  (ZETIA ) tablet 10 mg  10 mg Oral Daily Georgina Basket, MD   10 mg at 12/14/23 0941   ipratropium-albuterol (DUONEB) 0.5-2.5 (3) MG/3ML nebulizer solution 3 mL  3 mL Nebulization Q6H PRN  Georgina Basket, MD       irbesartan BAXTER) tablet 75 mg  75 mg Oral Daily Georgina Basket, MD   75 mg at 12/14/23 0941   LORazepam (ATIVAN) tablet 0.5 mg  0.5 mg Oral PRN Jerrol Agent, MD       ondansetron  (ZOFRAN ) injection 4 mg  4 mg Intravenous Q6H PRN Leotis Bogus, MD   4 mg at 12/12/23 1718   ondansetron  (ZOFRAN ) tablet 4 mg  4 mg Oral Q6H PRN Leotis Bogus, MD       senna-docusate (Senokot-S) tablet 1 tablet  1 tablet Oral BID Leotis Bogus, MD   1 tablet at 12/13/23 1743     Discharge Medications: Please  see discharge summary for a list of discharge medications.  Relevant Imaging Results:  Relevant Lab Results:   Additional Information SSN    [id number]  Sherline Clack, [place]

## 2023-12-14 NOTE — Plan of Care (Signed)

## 2023-12-14 NOTE — Progress Notes (Signed)
 Mobility Specialist: Progress Note   12/14/23 1600  Mobility  Activity Pivoted/transferred from chair to bed  Level of Assistance Contact guard assist, steadying assist  Assistive Device Front wheel walker  Activity Response Tolerated well  Mobility Referral Yes  Mobility visit 1 Mobility  Mobility Specialist Start Time (ACUTE ONLY) 1200  Mobility Specialist Stop Time (ACUTE ONLY) 1212  Mobility Specialist Time Calculation (min) (ACUTE ONLY) 12 min    Pt received in chair, requesting assistance back to bed. CGA for stand pivot to bed. No complaints. MinA for sit>supine to assist LE. Left in bed with all needs met, call bell in reach. Bed alarm on.  Ileana Lute Mobility Specialist Please contact via SecureChat or Rehab office at [phone]

## 2023-12-14 NOTE — Progress Notes (Signed)
 Mobility Specialist: Progress Note   12/14/23 1000  Mobility  Activity Ambulated with assistance  Level of Assistance Contact guard assist, steadying assist  Assistive Device Front wheel walker  Distance Ambulated (ft) 10 ft  Activity Response Tolerated well  Mobility Referral Yes  Mobility visit 1 Mobility  Mobility Specialist Start Time (ACUTE ONLY) 1025  Mobility Specialist Stop Time (ACUTE ONLY) 1038  Mobility Specialist Time Calculation (min) (ACUTE ONLY) 13 min    Pt received in bed, agreeable to mobility session. ModA for bed mobility to assist LLE off the bed and assist with scooting her hips. Heavy minA for STS with min cues for hand placement. Ambulated 10' and then utilized chair follow. Declined further ambulation after a seated break. Rolled pt back in the recliner to bedside. Agreed to sit up in the chair for awhile. Left in chair with all needs met, call bell in reach. Chair alarm on.   Angela Morrison Mobility Specialist Please contact via SecureChat or Rehab office at [phone]

## 2023-12-14 NOTE — Progress Notes (Signed)
 Patient resting comfortably during the night. No complaints reported.

## 2023-12-14 NOTE — Progress Notes (Signed)
 PROGRESS NOTE    Angela Morrison  FMW:[id number] DOB: September 30, 1950 DOA: 12/09/2023 PCP: Aisha Harvey, MD   Brief Narrative: This 73 years old female with PMH significant for hypertension, COPD, dementia presented in the ED for further evaluation of altered mental status related to her baseline dementia for 2 to 3 days.  Patient presented here with worsening confusion and multiple falls without loss of consciousness, although family notes that few of these falls have been unwitnessed.  Family also reports patient has exhibited evidence of left lower extremity weakness resulting in some dragging of left leg for greater than a month.  In the ED UA consistent with pyuria and bacteriuria.  CT head showed no evidence of acute intracranial abnormality.  CT cervical spine showed nonspecific neck mass. Patient was admitted for further evaluation and ENT is consulted for neck mass.  Assessment & Plan:   Principal Problem:   Acute encephalopathy Active Problems:   Neck mass  Acute encephalopathy: > Improved. Likely Acute cystitis: Initiated on IV ceftriaxone. Urine cultures contaminated.  May DC antibiotics after 3 days. Discontinue IV hydration. Back to her baseline mental status.   Recurrent falls: High suspicion for deconditioning; No obvious focal deficits on exam. 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   Neck mass: ENT consulted (Dr. Penne Croak); apprec eval/recs. Outpatient follow-up recommended.   Essential HTN: Continue Irbesartan 75mg  daily (in lieu pta valsartan 80mg  daily).   Hyperlipidemia:  Continue atorvastatin .   COPD: Continue Duonebs prn  DVT prophylaxis: SCDs Code Status: Full code Family Communication: No family at bed side. Disposition Plan:    Status is: Inpatient Remains inpatient appropriate because: Patient is medically clear,  awaiting placement in acute inpatient rehab/ SNF  Consultants:   None  Procedures:  MRI Brain.  Antimicrobials:  Anti-infectives (From admission, onward)    Start     Dose/Rate Route Frequency Ordered Stop   12/10/23 1100  cefTRIAXone (ROCEPHIN) 1 g in sodium chloride  0.9 % 100 mL IVPB        1 g 200 mL/hr over 30 Minutes Intravenous Every 24 hours 12/10/23 1009 12/14/23 1014      Subjective: Patient was seen and examined at bedside.Overnight events noted. Patient seems much better , following commands.  Back to her baseline. Patient reports she is awaiting placement in a rehab facility.  Objective: Vitals:   12/14/23 0120 12/14/23 0414 12/14/23 0821 12/14/23 1126  BP:  136/76 136/77 (!) 133/92  Pulse:  82 78 83  Resp:   18 18  Temp:  98.6 F (37 C) 98.5 F (36.9 C) 98.6 F (37 C)  TempSrc:      SpO2:  97% 94% 97%  Weight: 93.8 kg     Height:        Intake/Output Summary (Last 24 hours) at 12/14/2023 1212 Last data filed at 12/13/2023 2000 Gross per 24 hour  Intake --  Output 100 ml  Net -100 ml   Filed Weights   12/12/23 0500 12/13/23 0500 12/14/23 0120  Weight: 91.1 kg 93.8 kg 93.8 kg    Examination:  General exam: Appears calm and comfortable, deconditioned , not in any acute distress. Respiratory system: CTA Bilaterally . Respiratory effort normal.  RR 14 Cardiovascular system: S1 & S2 heard, RRR. No JVD, murmurs, rubs, gallops or clicks.  Gastrointestinal system: Abdomen is non distended, soft and non tender.  Normal bowel sounds heard. Central nervous system: Alert and oriented x 2. No focal neurological deficits.  Extremities: No edema, no cyanosis, no clubbing. Skin: No rashes, lesions or ulcers Psychiatry: Judgement and insight appear normal. Mood & affect appropriate.   Data Reviewed: I have personally reviewed following labs and imaging studies  CBC: Recent Labs  Lab 12/09/23 1546  WBC 12.2*  NEUTROABS 9.1*  HGB 12.4  HCT 38.6  MCV 89.8  PLT 221   Basic Metabolic Panel: Recent Labs  Lab  12/09/23 1546  NA 141  K 4.4  CL 104  CO2 27  GLUCOSE 133*  BUN 18  CREATININE 1.03*  CALCIUM  10.7*  MG 1.8   GFR: Estimated Creatinine Clearance: 54 mL/min (A) (by C-G formula based on SCr of 1.03 mg/dL (H)). Liver Function Tests: Recent Labs  Lab 12/09/23 1546  AST 40  ALT 16  ALKPHOS 162*  BILITOT 0.9  PROT 7.8  ALBUMIN 4.0   No results for input(s): LIPASE, AMYLASE in the last 168 hours. No results for input(s): AMMONIA in the last 168 hours. Coagulation Profile: No results for input(s): INR, PROTIME in the last 168 hours. Cardiac Enzymes: No results for input(s): CKTOTAL, CKMB, CKMBINDEX, TROPONINI in the last 168 hours. BNP (last 3 results) No results for input(s): PROBNP in the last 8760 hours. HbA1C: No results for input(s): HGBA1C in the last 72 hours. CBG: Recent Labs  Lab 12/09/23 1552  GLUCAP 128*   Lipid Profile: No results for input(s): CHOL, HDL, LDLCALC, TRIG, CHOLHDL, LDLDIRECT in the last 72 hours. Thyroid  Function Tests: No results for input(s): TSH, T4TOTAL, FREET4, T3FREE, THYROIDAB in the last 72 hours.  Anemia Panel: No results for input(s): VITAMINB12, FOLATE, FERRITIN, TIBC, IRON, RETICCTPCT in the last 72 hours. Sepsis Labs: No results for input(s): PROCALCITON, LATICACIDVEN in the last 168 hours.  No results found for this or any previous visit (from the past 240 hours).   Radiology Studies: No results found.   Scheduled Meds:  atorvastatin   80 mg Oral q morning   ezetimibe   10 mg Oral Daily   irbesartan  75 mg Oral Daily   senna-docusate  1 tablet Oral BID   Continuous Infusions:     LOS: 4 days    Time spent: 35 mins    Darcel Dawley, MD [facility]   If 7PM-7AM, please contact night-coverage

## 2023-12-15 DIAGNOSIS — G9340 Encephalopathy, unspecified: Secondary | ICD-10-CM | POA: Diagnosis not present

## 2023-12-15 LAB — CBC
HCT: 38.2 % (ref 36.0–46.0)
Hemoglobin: 12.6 g/dL (ref 12.0–15.0)
MCH: 29.4 pg (ref 26.0–34.0)
MCHC: 33 g/dL (ref 30.0–36.0)
MCV: 89 fL (ref 80.0–100.0)
Platelets: 229 K/uL (ref 150–400)
RBC: 4.29 MIL/uL (ref 3.87–5.11)
RDW: 14.8 % (ref 11.5–15.5)
WBC: 9.8 K/uL (ref 4.0–10.5)
nRBC: 0 % (ref 0.0–0.2)

## 2023-12-15 LAB — BASIC METABOLIC PANEL WITH GFR
Anion gap: 11 (ref 5–15)
BUN: 12 mg/dL (ref 8–23)
CO2: 25 mmol/L (ref 22–32)
Calcium: 9.5 mg/dL (ref 8.9–10.3)
Chloride: 102 mmol/L (ref 98–111)
Creatinine, Ser: 0.95 mg/dL (ref 0.44–1.00)
GFR, Estimated: >60 mL/min
Glucose, Bld: 94 mg/dL (ref 70–99)
Potassium: 4.2 mmol/L (ref 3.5–5.1)
Sodium: 138 mmol/L (ref 135–145)

## 2023-12-15 LAB — MAGNESIUM: Magnesium: 1.6 mg/dL — ABNORMAL LOW (ref 1.7–2.4)

## 2023-12-15 LAB — PHOSPHORUS: Phosphorus: 2.5 mg/dL (ref 2.5–4.6)

## 2023-12-15 MED ORDER — MAGNESIUM SULFATE 2 GM/50ML IV SOLN
2.0000 g | Freq: Once | INTRAVENOUS | Status: AC
  Administered 2023-12-15: 2 g via INTRAVENOUS
  Filled 2023-12-15: qty 50

## 2023-12-15 NOTE — Progress Notes (Addendum)
 PROGRESS NOTE    Angela Morrison  FMW:[id number] DOB: [birth year] DOA: 12/09/2023 PCP: Aisha Harvey, MD   Brief Narrative: This 73 years old female with PMH significant for hypertension, COPD, dementia presented in the ED for further evaluation of altered mental status related to her baseline dementia for 2 to 3 days.  Patient presented here with worsening confusion and multiple falls without loss of consciousness, although family notes that few of these falls have been unwitnessed.  Family also reports patient has exhibited evidence of left lower extremity weakness resulting in some dragging of left leg for greater than a month.  In the ED UA consistent with pyuria and bacteriuria.  CT head showed no evidence of acute intracranial abnormality.  CT cervical spine showed nonspecific neck mass. Patient was admitted for further evaluation and ENT is consulted for neck mass.  Assessment & Plan:   Principal Problem:   Acute encephalopathy Active Problems:   Neck mass  Acute encephalopathy: > Improved. Likely Acute cystitis: Initiated on IV ceftriaxone. Urine cultures contaminated.  May DC antibiotics after 3 days. Discontinue IV hydration. Back to her baseline mental status.   Recurrent falls: High suspicion for deconditioning; No obvious focal deficits on exam. 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   Neck mass: ENT consulted (Dr. Penne Croak); apprec eval/recs. Outpatient follow-up recommended.   Essential HTN: Continue Irbesartan 75mg  daily (in lieu pta valsartan 80mg  daily).   Hyperlipidemia:  Continue atorvastatin .   COPD: Continue Duonebs prn.  Hypomagnesemia: Replaced.  Continue to monitor.  DVT prophylaxis: SCDs Code Status: Full code Family Communication: Daughter at bed side Disposition Plan:    Status is: Inpatient Remains inpatient appropriate because: Patient is medically clear,  awaiting placement in acute  inpatient rehab/ SNF  Consultants:  None  Procedures:  MRI Brain.  Antimicrobials:  Anti-infectives (From admission, onward)    Start     Dose/Rate Route Frequency Ordered Stop   12/10/23 1100  cefTRIAXone (ROCEPHIN) 1 g in sodium chloride  0.9 % 100 mL IVPB        1 g 200 mL/hr over 30 Minutes Intravenous Every 24 hours 12/10/23 1009 12/14/23 1014      Subjective: Patient was seen and examined at bedside.Overnight events noted. Patient seems much better,  back to her baseline mental status.  She is following commands. Patient reports she is awaiting placement in a rehab facility.  Objective: Vitals:   12/14/23 1648 12/14/23 2015 12/15/23 0103 12/15/23 0522  BP: (!) 152/92 (!) 140/80  (!) 161/83  Pulse: 75 79  76  Resp:  19  18  Temp: 98.5 F (36.9 C) 98.3 F (36.8 C)  98.2 F (36.8 C)  TempSrc:      SpO2: 98% 100%  97%  Weight:   93.8 kg   Height:        Intake/Output Summary (Last 24 hours) at 12/15/2023 1111 Last data filed at 12/14/2023 2300 Gross per 24 hour  Intake 3 ml  Output 600 ml  Net -597 ml   Filed Weights   12/13/23 0500 12/14/23 0120 12/15/23 0103  Weight: 93.8 kg 93.8 kg 93.8 kg    Examination:  General exam: Appears calm and comfortable, deconditioned , not in any acute distress. Respiratory system: CTA Bilaterally . Respiratory effort normal.  RR 15 Cardiovascular system: S1 & S2 heard, RRR. No JVD, murmurs, rubs, gallops or clicks.  Gastrointestinal system: Abdomen is non distended, soft and non tender.  Normal bowel sounds  heard. Central nervous system: Alert and oriented x 2. No focal neurological deficits. Extremities: No edema, no cyanosis, no clubbing. Skin: No rashes, lesions or ulcers Psychiatry: Judgement and insight appear normal. Mood & affect appropriate.   Data Reviewed: I have personally reviewed following labs and imaging studies  CBC: Recent Labs  Lab 12/09/23 1546 12/15/23 0520  WBC 12.2* 9.8  NEUTROABS 9.1*  --    HGB 12.4 12.6  HCT 38.6 38.2  MCV 89.8 89.0  PLT 221 229   Basic Metabolic Panel: Recent Labs  Lab 12/09/23 1546 12/15/23 0520  NA 141 138  K 4.4 4.2  CL 104 102  CO2 27 25  GLUCOSE 133* 94  BUN 18 12  CREATININE 1.03* 0.95  CALCIUM  10.7* 9.5  MG 1.8 1.6*  PHOS  --  2.5   GFR: Estimated Creatinine Clearance: 58.5 mL/min (by C-G formula based on SCr of 0.95 mg/dL). Liver Function Tests: Recent Labs  Lab 12/09/23 1546  AST 40  ALT 16  ALKPHOS 162*  BILITOT 0.9  PROT 7.8  ALBUMIN 4.0   No results for input(s): LIPASE, AMYLASE in the last 168 hours. No results for input(s): AMMONIA in the last 168 hours. Coagulation Profile: No results for input(s): INR, PROTIME in the last 168 hours. Cardiac Enzymes: No results for input(s): CKTOTAL, CKMB, CKMBINDEX, TROPONINI in the last 168 hours. BNP (last 3 results) No results for input(s): PROBNP in the last 8760 hours. HbA1C: No results for input(s): HGBA1C in the last 72 hours. CBG: Recent Labs  Lab 12/09/23 1552  GLUCAP 128*   Lipid Profile: No results for input(s): CHOL, HDL, LDLCALC, TRIG, CHOLHDL, LDLDIRECT in the last 72 hours. Thyroid  Function Tests: No results for input(s): TSH, T4TOTAL, FREET4, T3FREE, THYROIDAB in the last 72 hours.  Anemia Panel: No results for input(s): VITAMINB12, FOLATE, FERRITIN, TIBC, IRON, RETICCTPCT in the last 72 hours. Sepsis Labs: No results for input(s): PROCALCITON, LATICACIDVEN in the last 168 hours.  No results found for this or any previous visit (from the past 240 hours).   Radiology Studies: No results found.   Scheduled Meds:  atorvastatin   80 mg Oral q morning   ezetimibe   10 mg Oral Daily   irbesartan  75 mg Oral Daily   polyethylene glycol  17 g Oral Daily   senna-docusate  1 tablet Oral BID   Continuous Infusions:     LOS: 5 days    Time spent: 35 mins    Darcel Dawley, MD [facility]   If 7PM-7AM, please contact night-coverage

## 2023-12-15 NOTE — Progress Notes (Signed)
 Mobility Specialist: Progress Note   12/15/23 1500  Mobility  Activity Ambulated with assistance  Level of Assistance Contact guard assist, steadying assist  Assistive Device Front wheel walker  Distance Ambulated (ft) 10 ft  Activity Response Tolerated well  Mobility Referral Yes  Mobility visit 1 Mobility  Mobility Specialist Start Time (ACUTE ONLY) 1101  Mobility Specialist Stop Time (ACUTE ONLY) 1119  Mobility Specialist Time Calculation (min) (ACUTE ONLY) 18 min    Pt received in bed, agreeable to mobility session. ModA for bed mobility to assist scooting EOB. MinA for STS. CGA for ambulation. Distance limited d/t LE fatigue. Agreed to sit up in the chair for awhile. Left in chair with all needs met, call bell in reach. Chair alarm on.   Ileana Lute Mobility Specialist Please contact via SecureChat or Rehab office at [phone]

## 2023-12-15 NOTE — TOC Progression Note (Signed)
 Transition of Care [facility]) - Progression Note    Patient Details  Name: Angela Morrison MRN: [id number] Date of Birth: 03-09-1950  Transition of [facility]) CM/SW Contact  Sherline Clack, [place] Phone Number: 12/15/2023, 11:09 AM  Clinical Narrative:     CSW spoke with patient's daughter, [name], over the phone regarding bed offers. CSW sent facility names and Medicare ratings to [name] via text for her to review. [name] requested CSW reach out to [name] about a bed for patient. CSW will reach out to the facility to see if they require more information before offering a bed. [name] plans to visit patient in the hospital before 4:30 pm and endorses reaching out to CSW to talk in person. CSW will continue to follow and update dc plan.    Expected Discharge Plan: Skilled Nursing Facility Barriers to Discharge: SNF Pending bed offer               Expected Discharge Plan and Services                                               Social Drivers of Health (SDOH) Interventions SDOH Screenings   Food Insecurity: No Food Insecurity (12/13/2023)  Housing: Low Risk  (12/13/2023)  Transportation Needs: Patient Unable To Answer (12/13/2023)  Utilities: Patient Unable To Answer (12/13/2023)  Depression (PHQ2-9): Medium Risk (05/23/2019)  Social Connections: Patient Unable To Answer (12/13/2023)  Tobacco Use: Low Risk  (12/09/2023)    Readmission Risk Interventions    12/13/2023    3:40 PM  Readmission Risk Prevention Plan  Post Dischage Appt Complete  Medication Screening Complete  Transportation Screening Complete

## 2023-12-15 NOTE — Plan of Care (Signed)
   Problem: Health Behavior/Discharge Planning: Goal: Ability to manage health-related needs will improve Outcome: Progressing   Problem: Clinical Measurements: Goal: Will remain free from infection Outcome: Progressing   Problem: Activity: Goal: Risk for activity intolerance will decrease Outcome: Progressing

## 2023-12-15 NOTE — Plan of Care (Signed)

## 2023-12-15 NOTE — Progress Notes (Signed)
 Physical Therapy Treatment Patient Details Name: Angela Morrison MRN: [id number] DOB: [birth year] Today's Date: 12/15/2023   History of Present Illness 73 year old female presenting to ED 11/01 with weakness and at least 4 falls in the last 24 hours, as well as inability to care for herself in the home. Labs notable for WBC 12.2. UA w/ pyuria and bacteruira. CT cervical spine without contrast showed a nonspecific neck mass. 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    PMH: HTN, HLD, CAD, obesity, DM2, depression, arthritis, vitamin D deficiency, vascular dementia    PT Comments  Pt declines attempts at ambulating other than returning to bed, seems to have been up in the recliner for multiple hours this afternoon. Pt requires cues for hand placement as well as physical assistance during transfers. Pt remains at an increased risk for falls due to LE weakness and poor endurance. Patient will benefit from continued inpatient follow up therapy, <3 hours/day.    If plan is discharge home, recommend the following: A lot of help with walking and/or transfers;A lot of help with bathing/dressing/bathroom;Assistance with cooking/housework;Direct supervision/assist for medications management;Direct supervision/assist for financial management;Assist for transportation;Help with stairs or ramp for entrance;Supervision due to cognitive status   Can travel by private vehicle     No  Equipment Recommendations  Rolling walker (2 wheels)    Recommendations for Other Services       Precautions / Restrictions Precautions Precautions: Fall Recall of Precautions/Restrictions: Intact Precaution/Restrictions Comments: multiple falls immediately prior to hospitalization Restrictions Weight Bearing Restrictions Per Provider Order: No     Mobility  Bed Mobility Overal bed  mobility: Needs Assistance Bed Mobility: Sit to Supine       Sit to supine: Mod assist        Transfers Overall transfer level: Needs assistance Equipment used: Rolling walker (2 wheels) Transfers: Sit to/from Stand Sit to Stand: Min assist                Ambulation/Gait Ambulation/Gait assistance: Min assist Gait Distance (Feet): 5 Feet Assistive device: Rolling walker (2 wheels) Gait Pattern/deviations: Step-to pattern Gait velocity: reduced Gait velocity interpretation: <1.31 ft/sec, indicative of household ambulator   General Gait Details: slowed step-to gait with downward gaze   Stairs             Wheelchair Mobility     Tilt Bed    Modified Rankin (Stroke Patients Only)       Balance Overall balance assessment: Needs assistance Sitting-balance support: No upper extremity supported, Feet supported Sitting balance-Leahy Scale: Fair     Standing balance support: Bilateral upper extremity supported, Reliant on assistive device for balance Standing balance-Leahy Scale: Poor                              Communication Communication Communication: No apparent difficulties  Cognition Arousal: Alert Behavior During Therapy: Flat affect   PT - Cognitive impairments: History of cognitive impairments                       PT - Cognition Comments: does not remember what caused her falls Following commands: Impaired Following commands impaired: Follows one step commands with increased time    Cueing Cueing Techniques: Verbal cues  Exercises      General Comments General comments (skin integrity,  edema, etc.): pt in NAD      Pertinent Vitals/Pain Pain Assessment Pain Assessment: Faces Faces Pain Scale: Hurts little more Pain Location: buttocks Pain Descriptors / Indicators: Aching Pain Intervention(s): Monitored during session    Home Living                          Prior Function            PT  Goals (current goals can now be found in the care plan section) Acute Rehab PT Goals Patient Stated Goal: pt does not state, PT goal to reduce falls risk Progress towards PT goals: Progressing toward goals    Frequency    Min 2X/week      PT Plan      Co-evaluation              AM-PAC PT 6 Clicks Mobility   Outcome Measure  Help needed turning from your back to your side while in a flat bed without using bedrails?: A Little Help needed moving from lying on your back to sitting on the side of a flat bed without using bedrails?: A Lot Help needed moving to and from a bed to a chair (including a wheelchair)?: A Little Help needed standing up from a chair using your arms (e.g., wheelchair or bedside chair)?: A Little Help needed to walk in hospital room?: Total Help needed climbing 3-5 steps with a railing? : Total 6 Click Score: 13    End of Session Equipment Utilized During Treatment: Gait belt Activity Tolerance: Patient tolerated treatment well Patient left: in bed;with call bell/phone within reach;with bed alarm set Nurse Communication: Mobility status PT Visit Diagnosis: Other abnormalities of gait and mobility (R26.89);History of falling (Z91.81);Dizziness and giddiness (R42);Other symptoms and signs involving the nervous system (R29.898)     Time: 1655-1710 PT Time Calculation (min) (ACUTE ONLY): 15 min  Charges:    $Therapeutic Activity: 8-22 mins PT General Charges $$ ACUTE PT VISIT: 1 Visit                     Bernardino JINNY Ruth, PT, DPT Acute Rehabilitation Office [phone]    Bernardino JINNY Ruth 12/15/2023, 5:21 PM

## 2023-12-16 DIAGNOSIS — G9340 Encephalopathy, unspecified: Secondary | ICD-10-CM | POA: Diagnosis not present

## 2023-12-16 NOTE — TOC Progression Note (Signed)
 Transition of Care [facility]) - Initial/Assessment Note    Patient Details  Name: Angela Morrison MRN: [id number] Date of Birth: 1950-12-01  Transition of [facility]) CM/SW Contact:    Britt JULIANNA Bennetts, LCSW Phone Number: 12/16/2023, 10:31 AM  Clinical Narrative:                 CSW contacted pt's daughter to provide update and receive family's SNF choice.  There was no answer.  CSW LM requesting a returned call.  TOC will continue to follow.  Expected Discharge Plan: Skilled Nursing Facility Barriers to Discharge: SNF Pending bed offer   Patient Goals and CMS Choice     Choice offered to / list presented to : Patient, Adult Children      Expected Discharge Plan and Services                                              Prior Living Arrangements/Services   Lives with:: Spouse Patient language and need for interpreter reviewed:: Yes                 Activities of Daily Living   ADL Screening (condition at time of admission) Independently performs ADLs?: Yes (appropriate for developmental age) Is the patient deaf or have difficulty hearing?: Yes Does the patient have difficulty seeing, even when wearing glasses/contacts?: Yes Does the patient have difficulty concentrating, remembering, or making decisions?: Yes  Permission Sought/Granted                  Emotional Assessment   Attitude/Demeanor/Rapport: Unable to Assess Affect (typically observed): Unable to Assess Orientation: : Oriented to Self, Oriented to Place, Oriented to  Time      Admission diagnosis:  Neck mass [R22.1] Weakness [R53.1] Acute encephalopathy [G93.40] Falls [R29.6] Altered mental status, unspecified altered mental status type [R41.82] Fatigue, unspecified type [R53.83] Patient Active Problem List   Diagnosis Date Noted   Neck mass 12/10/2023   Acute encephalopathy 12/09/2023   Type 2 diabetes mellitus with complication, without long-term current use of insulin  (HCC)  05/20/2020   Class 3 severe obesity with serious comorbidity and body mass index (BMI) of 45.0 to 49.9 in adult (HCC) 06/10/2019   Snoring 06/10/2019   Excessive daytime sleepiness 06/10/2019   Hyperlipidemia 12/19/2017   Cholelithiasis and acute cholecystitis without obstruction 07/02/2017   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 [place] ([place]) [place] [address] Phone: [phone] Fax: [phone]     Social Drivers of Health (SDOH) Social History: SDOH Screenings   Food Insecurity: No Food Insecurity (12/13/2023)  Housing: Low Risk  (12/13/2023)  Transportation Needs: Patient Unable To Answer (12/13/2023)  Utilities: Patient Unable To Answer (12/13/2023)  Depression (PHQ2-9): Medium Risk (05/23/2019)  Social Connections: Patient Unable To Answer (12/13/2023)  Tobacco Use: Low Risk  (12/09/2023)   SDOH Interventions:     Readmission Risk Interventions    12/13/2023    3:40 PM  Readmission Risk Prevention Plan  Post Dischage Appt Complete  Medication Screening Complete  Transportation Screening Complete

## 2023-12-16 NOTE — Progress Notes (Signed)
 PROGRESS NOTE    Angela Morrison  FMW:[id number] DOB: 1950/10/26 DOA: 12/09/2023 PCP: Aisha Harvey, MD   Brief Narrative: This 73 years old female with PMH significant for hypertension, COPD, dementia presented in the ED for further evaluation of altered mental status related to her baseline dementia for 2 to 3 days.  Patient presented here with worsening confusion and multiple falls without loss of consciousness, although family notes that few of these falls have been unwitnessed.  Family also reports patient has exhibited evidence of left lower extremity weakness resulting in some dragging of left leg for greater than a month.  In the ED UA consistent with pyuria and bacteriuria.  CT head showed no evidence of acute intracranial abnormality.  CT cervical spine showed nonspecific neck mass. Patient was admitted for further evaluation and ENT is consulted for neck mass.  Assessment & Plan:   Principal Problem:   Acute encephalopathy Active Problems:   Neck mass  Acute encephalopathy: > Improved. Likely Acute cystitis: Initiated on IV ceftriaxone. Urine cultures contaminated.  Antibiotics discontinued after 3 days. Discontinue IV hydration. Back to her baseline mental status.   Recurrent falls: High suspicion for deconditioning: No obvious focal deficits on exam. 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   Neck mass: ENT consulted (Dr. Penne Croak); apprec eval/recs. Outpatient follow-up recommended.   Essential HTN: Continue Irbesartan 75mg  daily (in lieu pta valsartan 80mg  daily).   Hyperlipidemia:  Continue atorvastatin .   COPD: Continue Duonebs prn.  Hypomagnesemia: Replaced.  Continue to monitor.  DVT prophylaxis: SCDs Code Status: Full code Family Communication: No family at Bed side. Disposition Plan:    Status is: Inpatient Remains inpatient appropriate because: Patient is medically clear,  awaiting placement in  acute inpatient rehab/ SNF  Consultants:  None  Procedures:  MRI Brain.  Antimicrobials:  Anti-infectives (From admission, onward)    Start     Dose/Rate Route Frequency Ordered Stop   12/10/23 1100  cefTRIAXone (ROCEPHIN) 1 g in sodium chloride  0.9 % 100 mL IVPB        1 g 200 mL/hr over 30 Minutes Intravenous Every 24 hours 12/10/23 1009 12/14/23 1014      Subjective: Patient was seen and examined at bedside. Overnight events noted. Patient appears much better,  back to her baseline mental status. Patient reports she is awaiting placement in a rehab facility.  Objective: Vitals:   12/15/23 2145 12/16/23 0200 12/16/23 0429 12/16/23 0827  BP: (!) 145/84 (!) 164/88 132/77 (!) 141/82  Pulse: 89 80 87 88  Resp:  18 18 20   Temp: 98.9 F (37.2 C) 98.8 F (37.1 C) 98.3 F (36.8 C) 99.4 F (37.4 C)  TempSrc:  Oral Oral   SpO2: 100% 95% 99% 98%  Weight:      Height:        Intake/Output Summary (Last 24 hours) at 12/16/2023 1124 Last data filed at 12/15/2023 1500 Gross per 24 hour  Intake 490 ml  Output --  Net 490 ml   Filed Weights   12/13/23 0500 12/14/23 0120 12/15/23 0103  Weight: 93.8 kg 93.8 kg 93.8 kg    Examination:  General exam: Appears calm and comfortable, deconditioned , not in any acute distress. Respiratory system: CTA Bilaterally. Respiratory effort normal.  RR 13 Cardiovascular system: S1 & S2 heard, RRR. No JVD, murmurs, rubs, gallops or clicks.  Gastrointestinal system: Abdomen is non distended, soft and non tender.  Normal bowel sounds heard. Central nervous system:  Alert and oriented x 2. No focal neurological deficits. Extremities: No edema, no cyanosis, no clubbing. Skin: No rashes, lesions or ulcers Psychiatry: Judgement and insight appear normal. Mood & affect appropriate.   Data Reviewed: I have personally reviewed following labs and imaging studies  CBC: Recent Labs  Lab 12/09/23 1546 12/15/23 0520  WBC 12.2* 9.8  NEUTROABS 9.1*   --   HGB 12.4 12.6  HCT 38.6 38.2  MCV 89.8 89.0  PLT 221 229   Basic Metabolic Panel: Recent Labs  Lab 12/09/23 1546 12/15/23 0520  NA 141 138  K 4.4 4.2  CL 104 102  CO2 27 25  GLUCOSE 133* 94  BUN 18 12  CREATININE 1.03* 0.95  CALCIUM  10.7* 9.5  MG 1.8 1.6*  PHOS  --  2.5   GFR: Estimated Creatinine Clearance: 58.5 mL/min (by C-G formula based on SCr of 0.95 mg/dL). Liver Function Tests: Recent Labs  Lab 12/09/23 1546  AST 40  ALT 16  ALKPHOS 162*  BILITOT 0.9  PROT 7.8  ALBUMIN 4.0   No results for input(s): LIPASE, AMYLASE in the last 168 hours. No results for input(s): AMMONIA in the last 168 hours. Coagulation Profile: No results for input(s): INR, PROTIME in the last 168 hours. Cardiac Enzymes: No results for input(s): CKTOTAL, CKMB, CKMBINDEX, TROPONINI in the last 168 hours. BNP (last 3 results) No results for input(s): PROBNP in the last 8760 hours. HbA1C: No results for input(s): HGBA1C in the last 72 hours. CBG: Recent Labs  Lab 12/09/23 1552  GLUCAP 128*   Lipid Profile: No results for input(s): CHOL, HDL, LDLCALC, TRIG, CHOLHDL, LDLDIRECT in the last 72 hours. Thyroid  Function Tests: No results for input(s): TSH, T4TOTAL, FREET4, T3FREE, THYROIDAB in the last 72 hours.  Anemia Panel: No results for input(s): VITAMINB12, FOLATE, FERRITIN, TIBC, IRON, RETICCTPCT in the last 72 hours. Sepsis Labs: No results for input(s): PROCALCITON, LATICACIDVEN in the last 168 hours.  No results found for this or any previous visit (from the past 240 hours).   Radiology Studies: No results found.   Scheduled Meds:  atorvastatin   80 mg Oral q morning   ezetimibe   10 mg Oral Daily   irbesartan  75 mg Oral Daily   polyethylene glycol  17 g Oral Daily   senna-docusate  1 tablet Oral BID   Continuous Infusions:     LOS: 6 days    Time spent: 35 mins    Darcel Dawley, MD [facility]   If 7PM-7AM, please contact night-coverage

## 2023-12-16 NOTE — Plan of Care (Signed)

## 2023-12-17 DIAGNOSIS — G9340 Encephalopathy, unspecified: Secondary | ICD-10-CM | POA: Diagnosis not present

## 2023-12-17 MED ORDER — SODIUM CHLORIDE 0.9 % IV SOLN: INTRAVENOUS | Status: AC

## 2023-12-17 NOTE — Progress Notes (Signed)
 PROGRESS NOTE    Angela Morrison  FMW:[id number] DOB: 1950/04/11 DOA: 12/09/2023 PCP: Aisha Harvey, MD   Brief Narrative: This 73 years old female with PMH significant for hypertension, COPD, dementia presented in the ED for further evaluation of altered mental status related to her baseline dementia for 2 to 3 days.  Patient presented here with worsening confusion and multiple falls without loss of consciousness, although family notes that few of these falls have been unwitnessed.  Family also reports patient has exhibited evidence of left lower extremity weakness resulting in some dragging of left leg for greater than a month.  In the ED UA consistent with pyuria and bacteriuria.  CT head showed no evidence of acute intracranial abnormality.  CT cervical spine showed nonspecific neck mass. Patient was admitted for further evaluation and ENT is consulted for neck mass.  Assessment & Plan:   Principal Problem:   Acute encephalopathy Active Problems:   Neck mass  Acute encephalopathy: > Improved. Likely Acute cystitis: Initiated on IV ceftriaxone. Urine cultures contaminated.  Antibiotics discontinued after 3 days. Discontinue IV hydration. Back to her baseline mental status.   Recurrent falls: High suspicion for deconditioning: No obvious focal deficits on exam. 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   Neck mass: ENT consulted (Dr. Penne Croak); apprec eval/recs. Outpatient follow-up recommended.   Essential HTN: Continue Irbesartan 75mg  daily (in lieu pta valsartan 80mg  daily).   Hyperlipidemia:  Continue atorvastatin .   COPD: Continue Duonebs prn.  Hypomagnesemia: Replaced.  Continue to monitor.  DVT prophylaxis: SCDs Code Status: Full code Family Communication: No family at Bed side. Disposition Plan:    Status is: Inpatient Remains inpatient appropriate because: Patient is medically clear,  awaiting placement in  acute inpatient rehab/ SNF  Consultants:  None  Procedures:  MRI Brain.  Antimicrobials:  Anti-infectives (From admission, onward)    Start     Dose/Rate Route Frequency Ordered Stop   12/10/23 1100  cefTRIAXone (ROCEPHIN) 1 g in sodium chloride  0.9 % 100 mL IVPB        1 g 200 mL/hr over 30 Minutes Intravenous Every 24 hours 12/10/23 1009 12/14/23 1014      Subjective: Patient was seen and examined at bedside. Overnight events noted. Patient appears much better,  back to her baseline mental status. She is awaiting placement in rehab facility pending insurance authorization.  Objective: Vitals:   12/16/23 2001 12/17/23 0500 12/17/23 0507 12/17/23 0839  BP: 136/75  (!) 133/93 127/81  Pulse:   (!) 103 (!) 101  Resp: 16  18 20   Temp: 98.3 F (36.8 C)  98.5 F (36.9 C) 98.9 F (37.2 C)  TempSrc:      SpO2: 92%  (!) 87% 93%  Weight:  94.1 kg    Height:        Intake/Output Summary (Last 24 hours) at 12/17/2023 1150 Last data filed at 12/17/2023 0900 Gross per 24 hour  Intake 0 ml  Output --  Net 0 ml   Filed Weights   12/14/23 0120 12/15/23 0103 12/17/23 0500  Weight: 93.8 kg 93.8 kg 94.1 kg    Examination:  General exam: Appears calm and comfortable, deconditioned , not in any acute distress. Respiratory system: CTA Bilaterally. Respiratory effort normal.  RR 14 Cardiovascular system: S1 & S2 heard, RRR. No JVD, murmurs, rubs, gallops or clicks.  Gastrointestinal system: Abdomen is non distended, soft and non tender.  Normal bowel sounds heard. Central nervous system: Alert  and oriented x 2. No focal neurological deficits. Extremities: No edema, no cyanosis, no clubbing. Skin: No rashes, lesions or ulcers Psychiatry: Judgement and insight appear normal. Mood & affect appropriate.   Data Reviewed: I have personally reviewed following labs and imaging studies  CBC: Recent Labs  Lab 12/15/23 0520  WBC 9.8  HGB 12.6  HCT 38.2  MCV 89.0  PLT 229   Basic  Metabolic Panel: Recent Labs  Lab 12/15/23 0520  NA 138  K 4.2  CL 102  CO2 25  GLUCOSE 94  BUN 12  CREATININE 0.95  CALCIUM  9.5  MG 1.6*  PHOS 2.5   GFR: Estimated Creatinine Clearance: 58.7 mL/min (by C-G formula based on SCr of 0.95 mg/dL). Liver Function Tests: No results for input(s): AST, ALT, ALKPHOS, BILITOT, PROT, ALBUMIN in the last 168 hours.  No results for input(s): LIPASE, AMYLASE in the last 168 hours. No results for input(s): AMMONIA in the last 168 hours. Coagulation Profile: No results for input(s): INR, PROTIME in the last 168 hours. Cardiac Enzymes: No results for input(s): CKTOTAL, CKMB, CKMBINDEX, TROPONINI in the last 168 hours. BNP (last 3 results) No results for input(s): PROBNP in the last 8760 hours. HbA1C: No results for input(s): HGBA1C in the last 72 hours. CBG: No results for input(s): GLUCAP in the last 168 hours.  Lipid Profile: No results for input(s): CHOL, HDL, LDLCALC, TRIG, CHOLHDL, LDLDIRECT in the last 72 hours. Thyroid  Function Tests: No results for input(s): TSH, T4TOTAL, FREET4, T3FREE, THYROIDAB in the last 72 hours.  Anemia Panel: No results for input(s): VITAMINB12, FOLATE, FERRITIN, TIBC, IRON, RETICCTPCT in the last 72 hours. Sepsis Labs: No results for input(s): PROCALCITON, LATICACIDVEN in the last 168 hours.  No results found for this or any previous visit (from the past 240 hours).   Radiology Studies: No results found.   Scheduled Meds:  atorvastatin   80 mg Oral q morning   ezetimibe   10 mg Oral Daily   irbesartan  75 mg Oral Daily   polyethylene glycol  17 g Oral Daily   senna-docusate  1 tablet Oral BID   Continuous Infusions:     LOS: 7 days    Time spent: 35 mins    Darcel Dawley, MD [facility]   If 7PM-7AM, please contact night-coverage

## 2023-12-17 NOTE — Plan of Care (Signed)
  Problem: Clinical Measurements: Goal: Ability to maintain clinical measurements within normal limits will improve Outcome: Progressing Goal: Will remain free from infection Outcome: Progressing Goal: Diagnostic test results will improve Outcome: Progressing Goal: Respiratory complications will improve Outcome: Progressing Goal: Cardiovascular complication will be avoided Outcome: Progressing   Problem: Activity: Goal: Risk for activity intolerance will decrease Outcome: Progressing   Problem: Coping: Goal: Level of anxiety will decrease Outcome: Progressing   Problem: Elimination: Goal: Will not experience complications related to bowel motility Outcome: Progressing Goal: Will not experience complications related to urinary retention Outcome: Progressing   Problem: Pain Managment: Goal: General experience of comfort will improve and/or be controlled Outcome: Progressing   Problem: Safety: Goal: Ability to remain free from injury will improve Outcome: Progressing   Problem: Skin Integrity: Goal: Risk for impaired skin integrity will decrease Outcome: Progressing   Problem: Education: Goal: Knowledge of General Education information will improve Description: Including pain rating scale, medication(s)/side effects and non-pharmacologic comfort measures Outcome: Not Progressing   Problem: Health Behavior/Discharge Planning: Goal: Ability to manage health-related needs will improve Outcome: Not Progressing   Problem: Nutrition: Goal: Adequate nutrition will be maintained Outcome: Not Progressing

## 2023-12-17 NOTE — Plan of Care (Signed)

## 2023-12-18 DIAGNOSIS — G9340 Encephalopathy, unspecified: Secondary | ICD-10-CM | POA: Diagnosis not present

## 2023-12-18 LAB — BASIC METABOLIC PANEL WITH GFR
Anion gap: 14 (ref 5–15)
BUN: 28 mg/dL — ABNORMAL HIGH (ref 8–23)
CO2: 21 mmol/L — ABNORMAL LOW (ref 22–32)
Calcium: 9.6 mg/dL (ref 8.9–10.3)
Chloride: 103 mmol/L (ref 98–111)
Creatinine, Ser: 1.16 mg/dL — ABNORMAL HIGH (ref 0.44–1.00)
GFR, Estimated: 50 mL/min — ABNORMAL LOW (ref >60)
Glucose, Bld: 130 mg/dL — ABNORMAL HIGH (ref 70–99)
Potassium: 4.4 mmol/L (ref 3.5–5.1)
Sodium: 138 mmol/L (ref 135–145)

## 2023-12-18 LAB — MAGNESIUM: Magnesium: 1.8 mg/dL (ref 1.7–2.4)

## 2023-12-18 LAB — CBC
HCT: 36.9 % (ref 36.0–46.0)
Hemoglobin: 12.1 g/dL (ref 12.0–15.0)
MCH: 29 pg (ref 26.0–34.0)
MCHC: 32.8 g/dL (ref 30.0–36.0)
MCV: 88.5 fL (ref 80.0–100.0)
Platelets: 261 K/uL (ref 150–400)
RBC: 4.17 MIL/uL (ref 3.87–5.11)
RDW: 15 % (ref 11.5–15.5)
WBC: 13.7 K/uL — ABNORMAL HIGH (ref 4.0–10.5)
nRBC: 0 % (ref 0.0–0.2)

## 2023-12-18 LAB — PHOSPHORUS: Phosphorus: 3.1 mg/dL (ref 2.5–4.6)

## 2023-12-18 NOTE — Progress Notes (Signed)
 PROGRESS NOTE    Angela Morrison  FMW:[id number] DOB: [birth year] DOA: 12/09/2023 PCP: Aisha Harvey, MD   Brief Narrative: This 73 years old female with PMH significant for hypertension, COPD, dementia presented in the ED for further evaluation of altered mental status related to her baseline dementia for 2 to 3 days.  Patient presented here with worsening confusion and multiple falls without loss of consciousness, although family notes that few of these falls have been unwitnessed.  Family also reports patient has exhibited evidence of left lower extremity weakness resulting in some dragging of left leg for greater than a month.  In the ED UA consistent with pyuria and bacteriuria.  CT head showed no evidence of acute intracranial abnormality.  CT cervical spine showed nonspecific neck mass. Patient was admitted for further evaluation and ENT is consulted for neck mass.  Assessment & Plan:   Principal Problem:   Acute encephalopathy Active Problems:   Neck mass  Acute encephalopathy: > Improved. Likely Acute cystitis: Initiated on IV ceftriaxone. Urine cultures contaminated.  Antibiotics discontinued after 3 days. Back to her baseline mental status.   Recurrent falls: High suspicion for deconditioning: No obvious focal deficits on exam. 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   Neck mass: ENT consulted (Dr. Penne Croak); apprec eval/recs. Outpatient follow-up recommended.   Essential HTN: Continue Irbesartan 75mg  daily (in lieu pta valsartan 80mg  daily).   Hyperlipidemia:  Continue atorvastatin .   COPD: Continue Duonebs prn.  Hypomagnesemia: Replaced.  Continue to monitor.  DVT prophylaxis: SCDs Code Status: Full code Family Communication: No family at Bed side. Disposition Plan:    Status is: Inpatient Remains inpatient appropriate because: Patient is medically clear,  awaiting placement in acute inpatient rehab/  SNF  Consultants:  None  Procedures:  MRI Brain.  Antimicrobials:  Anti-infectives (From admission, onward)    Start     Dose/Rate Route Frequency Ordered Stop   12/10/23 1100  cefTRIAXone (ROCEPHIN) 1 g in sodium chloride  0.9 % 100 mL IVPB        1 g 200 mL/hr over 30 Minutes Intravenous Every 24 hours 12/10/23 1009 12/14/23 1014      Subjective: Patient was seen and examined at bedside. Overnight events noted. Patient is back to her baseline mental status. RN reports having poor appetite. She is awaiting placement in rehab facility pending insurance authorization.  Objective: Vitals:   12/17/23 2357 12/18/23 0447 12/18/23 0500 12/18/23 0751  BP: 104/74 121/75  110/64  Pulse: (!) 102 98  94  Resp:    16  Temp: 98.8 F (37.1 C) 99.7 F (37.6 C)  98.5 F (36.9 C)  TempSrc:      SpO2: 97% 96%  95%  Weight:   93 kg   Height:        Intake/Output Summary (Last 24 hours) at 12/18/2023 1102 Last data filed at 12/18/2023 0500 Gross per 24 hour  Intake 602.8 ml  Output 300 ml  Net 302.8 ml   Filed Weights   12/15/23 0103 12/17/23 0500 12/18/23 0500  Weight: 93.8 kg 94.1 kg 93 kg    Examination:  General exam: Appears calm and comfortable, deconditioned , not in any acute distress. Respiratory system: CTA Bilaterally. Respiratory effort normal.  RR 15 Cardiovascular system: S1 & S2 heard, RRR. No JVD, murmurs, rubs, gallops or clicks.  Gastrointestinal system: Abdomen is non distended, soft and non tender.  Normal bowel sounds heard. Central nervous system: Alert and oriented x  2. No focal neurological deficits. Extremities: No edema, no cyanosis, no clubbing. Skin: No rashes, lesions or ulcers Psychiatry: Judgement and insight appear normal. Mood & affect appropriate.   Data Reviewed: I have personally reviewed following labs and imaging studies  CBC: Recent Labs  Lab 12/15/23 0520 12/18/23 1004  WBC 9.8 13.7*  HGB 12.6 12.1  HCT 38.2 36.9  MCV 89.0 88.5   PLT 229 261   Basic Metabolic Panel: Recent Labs  Lab 12/15/23 0520  NA 138  K 4.2  CL 102  CO2 25  GLUCOSE 94  BUN 12  CREATININE 0.95  CALCIUM  9.5  MG 1.6*  PHOS 2.5   GFR: Estimated Creatinine Clearance: 58.3 mL/min (by C-G formula based on SCr of 0.95 mg/dL). Liver Function Tests: No results for input(s): AST, ALT, ALKPHOS, BILITOT, PROT, ALBUMIN in the last 168 hours.  No results for input(s): LIPASE, AMYLASE in the last 168 hours. No results for input(s): AMMONIA in the last 168 hours. Coagulation Profile: No results for input(s): INR, PROTIME in the last 168 hours. Cardiac Enzymes: No results for input(s): CKTOTAL, CKMB, CKMBINDEX, TROPONINI in the last 168 hours. BNP (last 3 results) No results for input(s): PROBNP in the last 8760 hours. HbA1C: No results for input(s): HGBA1C in the last 72 hours. CBG: No results for input(s): GLUCAP in the last 168 hours.  Lipid Profile: No results for input(s): CHOL, HDL, LDLCALC, TRIG, CHOLHDL, LDLDIRECT in the last 72 hours. Thyroid  Function Tests: No results for input(s): TSH, T4TOTAL, FREET4, T3FREE, THYROIDAB in the last 72 hours.  Anemia Panel: No results for input(s): VITAMINB12, FOLATE, FERRITIN, TIBC, IRON, RETICCTPCT in the last 72 hours. Sepsis Labs: No results for input(s): PROCALCITON, LATICACIDVEN in the last 168 hours.  No results found for this or any previous visit (from the past 240 hours).   Radiology Studies: No results found.   Scheduled Meds:  atorvastatin   80 mg Oral q morning   ezetimibe   10 mg Oral Daily   irbesartan  75 mg Oral Daily   polyethylene glycol  17 g Oral Daily   senna-docusate  1 tablet Oral BID   Continuous Infusions:  sodium chloride  75 mL/hr at 12/17/23 2057      LOS: 8 days    Time spent: 35 mins    Darcel Dawley, MD [facility]   If 7PM-7AM, please contact night-coverage

## 2023-12-18 NOTE — Plan of Care (Signed)

## 2023-12-18 NOTE — Progress Notes (Signed)
 Occupational Therapy Treatment Patient Details Name: Angela Morrison MRN: [id number] DOB: 11/04/1950 Today's Date: 12/18/2023   History of present illness 73 year old female presenting to ED 11/01 with weakness and at least 4 falls in the last 24 hours, as well as inability to care for herself in the home. Labs notable for WBC 12.2. UA w/ pyuria and bacteruira. CT cervical spine without contrast showed a nonspecific neck mass. 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    PMH: HTN, HLD, CAD, obesity, DM2, depression, arthritis, vitamin D deficiency, vascular dementia   OT comments  Pt with slower progress towards OT goals today due to poor sitting balance EOB and impairments in initiation, sequencing and awareness. Pt required Mod-Max A for bed mobility today and intermittent CGA-Min A to correct EOB balance d/t R lateral and posterior bias. Pt able to assist with UB ADLs EOB with overall Min A, declined standing or transfer OOB despite encouragement. Noted family unable to provide 24/7 assist upon DC so will need to look into lower intensity postacute rehab options.       If plan is discharge home, recommend the following:  A lot of help with walking and/or transfers;A lot of help with bathing/dressing/bathroom;Direct supervision/assist for financial management;Direct supervision/assist for medications management   Equipment Recommendations  BSC/3in1;Other (comment) (RW; TBD)    Recommendations for Other Services      Precautions / Restrictions Precautions Precautions: Fall Recall of Precautions/Restrictions: Intact Precaution/Restrictions Comments: multiple falls immediately prior to hospitalization Restrictions Weight Bearing Restrictions Per Provider Order: No       Mobility Bed Mobility Overal bed mobility: Needs Assistance Bed Mobility: Sit to  Supine, Supine to Sit     Supine to sit: Mod assist, HOB elevated, Used rails Sit to supine: Max assist   General bed mobility comments: cues to initiate and sequence LE to EOB, cues for bedrail use and assist to lift trunk. Max A to return to bed with minimal muscle activation to lift LE to EOB of guide trunk to supine    Transfers                   General transfer comment: encouraged to stand with RW to improve ability to reposition in bed as pt declined OOB transfer to chair though pt declined. Attempted to cue for lateral scooting with minimal muscle activation and Total A needed     Balance Overall balance assessment: Needs assistance Sitting-balance support: No upper extremity supported, Feet supported, Single extremity supported Sitting balance-Leahy Scale: Poor Sitting balance - Comments: kept RUE on bedrail for most of session, frequent CGA to Min A to correct posterior LOB Postural control: Posterior lean                                 ADL either performed or assessed with clinical judgement   ADL Overall ADL's : Needs assistance/impaired     Grooming: Contact guard assist;Sitting;Wash/dry face Grooming Details (indicate cue type and reason): CGA for sitting balance, cues to correct posterior bias during task Upper Body Bathing: Minimal assistance;Sitting Upper Body Bathing Details (indicate cue type and reason): assist for back  Extremity/Trunk Assessment Upper Extremity Assessment Upper Extremity Assessment: Generalized weakness   Lower Extremity Assessment Lower Extremity Assessment: Defer to PT evaluation        Vision   Vision Assessment?: No apparent visual deficits   Perception     Praxis     Communication Communication Communication: No apparent difficulties   Cognition Arousal: Alert Behavior During Therapy: Flat affect Cognition: Cognition impaired   Orientation impairments:  Time, Situation Awareness: Intellectual awareness impaired, Online awareness impaired Memory impairment (select all impairments): Working civil service fast streamer, Conservation officer, historic buildings, Short-term memory Attention impairment (select first level of impairment): Selective attention Executive functioning impairment (select all impairments): Organization, Problem solving OT - Cognition Comments: hx of dementia, flat affect. cues for awareness of posterior bias sitting EOB. frequent cues for sequencing and direction following                 Following commands: Impaired Following commands impaired: Follows one step commands inconsistently, Follows one step commands with increased time      Cueing   Cueing Techniques: Verbal cues, Gestural cues  Exercises Exercises: Other exercises Other Exercises Other Exercises: WB to L elbow in attempts to correct posterior/R lateral lean    Shoulder Instructions       General Comments      Pertinent Vitals/ Pain       Pain Assessment Pain Assessment: Faces Faces Pain Scale: Hurts a little bit Pain Location: R wrist Pain Descriptors / Indicators: Sore Pain Intervention(s): Monitored during session, Heat applied, Other (comment) (encouraged gentle ROM)  Home Living                                          Prior Functioning/Environment              Frequency  Min 2X/week        Progress Toward Goals  OT Goals(current goals can now be found in the care plan section)  Progress towards OT goals: Not progressing toward goals - comment  Acute Rehab OT Goals Patient Stated Goal: get back to bed OT Goal Formulation: With patient Time For Goal Achievement: 12/25/23 Potential to Achieve Goals: Good ADL Goals Pt Will Perform Lower Body Bathing: with contact guard assist;sitting/lateral leans;sit to/from stand Pt Will Perform Lower Body Dressing: with contact guard assist;sit to/from stand;sitting/lateral leans Pt Will  Transfer to Toilet: with contact guard assist;ambulating Additional ADL Goal #1: Pt to increase standing tolerance > 6 min during ADLs/mobility without need for seated rest break  Plan      Co-evaluation                 AM-PAC OT 6 Clicks Daily Activity     Outcome Measure   Help from another person eating meals?: None Help from another person taking care of personal grooming?: A Little Help from another person toileting, which includes using toliet, bedpan, or urinal?: A Lot Help from another person bathing (including washing, rinsing, drying)?: A Lot Help from another person to put on and taking off regular upper body clothing?: A Little Help from another person to put on and taking off regular lower body clothing?: A Lot 6 Click Score: 16    End of Session    OT Visit Diagnosis: Unsteadiness on feet (R26.81);Other abnormalities of gait and mobility (R26.89);Muscle weakness (generalized) (M62.81);Other symptoms and signs involving cognitive function   Activity Tolerance Patient  limited by fatigue   Patient Left in bed;with bed alarm set;with call bell/phone within reach   Nurse Communication          Time: [phone] OT Time Calculation (min): 23 min  Charges: OT General Charges $OT Visit: 1 Visit OT Treatments $Self Care/Home Management : 8-22 mins  Mliss NOVAK, OTR/L Acute Rehab Services Office: [phone]   Mliss Fish 12/18/2023, 8:50 AM

## 2023-12-18 NOTE — Progress Notes (Signed)
 Physical Therapy Treatment Patient Details Name: Angela Morrison MRN: [id number] DOB: 1950-03-25 Today's Date: 12/18/2023   History of Present Illness 73 year old female presenting to ED 11/01 with weakness and at least 4 falls in the last 24 hours, as well as inability to care for herself in the home. Labs notable for WBC 12.2. UA w/ pyuria and bacteruira. CT cervical spine without contrast showed a nonspecific neck mass. 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    PMH: HTN, HLD, CAD, obesity, DM2, depression, arthritis, vitamin D deficiency, vascular dementia    PT Comments  Pt is currently Max A for bed mobility, with Max to Mod A intermittently to maintain mid line sitting balance on EOB. Pt unable to progress to standing this session. Currently pt seems to have declined from initial evaluation; Medical team was updated on findings. Due to pt current functional status, home set up and available assistance at home recommending skilled physical therapy services < 3 hours/day in order to address strength, balance and functional mobility to decrease risk for falls, injury, immobility, skin break down and re-hospitalization.      If plan is discharge home, recommend the following: A lot of help with walking and/or transfers;A lot of help with bathing/dressing/bathroom;Assistance with cooking/housework;Direct supervision/assist for medications management;Direct supervision/assist for financial management;Assist for transportation;Help with stairs or ramp for entrance;Supervision due to cognitive status   Can travel by private vehicle     No  Equipment Recommendations  Wheelchair cushion (measurements PT);Wheelchair (measurements PT);Hoyer lift;Hospital bed       Precautions / Restrictions Precautions Precautions: Fall Recall of Precautions/Restrictions:  Impaired Precaution/Restrictions Comments: multiple falls immediately prior to hospitalization Restrictions Weight Bearing Restrictions Per Provider Order: No     Mobility  Bed Mobility Overal bed mobility: Needs Assistance Bed Mobility: Sit to Supine, Supine to Sit, Rolling Rolling: Max assist   Supine to sit: Max assist Sit to supine: Max assist   General bed mobility comments: Max A to get to EOB and to get back to bed for trunk and LE. Pt with difficulty maintaining mid line in sitting stating she is dizzy in supine and sitting. Max A for rolling R/L with heavy cues for sequencing to decrease need for assist.     Modified Rankin (Stroke Patients Only) Modified Rankin (Stroke Patients Only) Pre-Morbid Rankin Score: Moderate disability Modified Rankin: Severe disability     Balance Overall balance assessment: Needs assistance Sitting-balance support: No upper extremity supported, Feet supported, Single extremity supported Sitting balance-Leahy Scale: Zero Sitting balance - Comments: Pt with leaning both directions, heavy Mod to Max A to get to mid line.       Standing balance comment: unable to get to standing.      Communication Communication Communication: No apparent difficulties  Cognition Arousal: Lethargic, Alert Behavior During Therapy: Flat affect   PT - Cognitive impairments: History of cognitive impairments     PT - Cognition Comments: able to report she was falling, knows she is in the hospital, unsure which hospital, knows [facility] is in [place], says it is November and unable to guess at a year. Following commands: Impaired Following commands impaired: Follows one step commands inconsistently, Follows one step commands with increased time    Cueing Cueing Techniques: Verbal cues, Gestural cues, Tactile cues, Visual cues     General Comments General comments (skin integrity, edema, etc.): no sign/symptoms  of cardiac/respiratory distress during session.  Pt assisted with cleaning up BM      Pertinent Vitals/Pain Pain Assessment Pain Assessment: Faces Faces Pain Scale: Hurts little more Breathing: normal Negative Vocalization: occasional moan/groan, low speech, negative/disapproving quality Facial Expression: smiling or inexpressive Body Language: relaxed Consolability: no need to console PAINAD Score: 1 Pain Location: L hand, R elbow, R shoulder and R knee Pain Descriptors / Indicators: Sore, Grimacing, Guarding Pain Intervention(s): Monitored during session, Limited activity within patient's tolerance, Repositioned     PT Goals (current goals can now be found in the care plan section) Acute Rehab PT Goals Patient Stated Goal: pt does not state, PT goal to reduce falls risk PT Goal Formulation: With patient Time For Goal Achievement: 12/25/23 Potential to Achieve Goals: Fair Progress towards PT goals: Not progressing toward goals - comment (pt very fatigued; seems she is getting weaker)    Frequency    Min 2X/week      PT Plan  Continue with current POC        AM-PAC PT 6 Clicks Mobility   Outcome Measure  Help needed turning from your back to your side while in a flat bed without using bedrails?: A Lot Help needed moving from lying on your back to sitting on the side of a flat bed without using bedrails?: A Lot Help needed moving to and from a bed to a chair (including a wheelchair)?: Total Help needed standing up from a chair using your arms (e.g., wheelchair or bedside chair)?: Total Help needed to walk in hospital room?: Total Help needed climbing 3-5 steps with a railing? : Total 6 Click Score: 8    End of Session   Activity Tolerance: Patient limited by fatigue Patient left: in bed;with call bell/phone within reach;with bed alarm set Nurse Communication: Mobility status PT Visit Diagnosis: Other abnormalities of gait and mobility (R26.89);History of falling (Z91.81);Dizziness and giddiness (R42);Other  symptoms and signs involving the nervous system (R29.898)     Time: [id number] PT Time Calculation (min) (ACUTE ONLY): 28 min  Charges:    $Therapeutic Activity: 23-37 mins PT General Charges $$ ACUTE PT VISIT: 1 Visit                    Dorothyann Maier, DPT, CLT  Acute Rehabilitation Services Office: [phone] (Secure chat preferred)    Dorothyann VEAR Maier 12/18/2023, 3:28 PM

## 2023-12-19 ENCOUNTER — Inpatient Hospital Stay (HOSPITAL_COMMUNITY)

## 2023-12-19 DIAGNOSIS — G9340 Encephalopathy, unspecified: Secondary | ICD-10-CM | POA: Diagnosis not present

## 2023-12-19 NOTE — Progress Notes (Addendum)
 PROGRESS NOTE    Angela Morrison  FMW:[id number] DOB: Mar 27, 1950 DOA: 12/09/2023 PCP: Aisha Harvey, MD   Brief Narrative: This 73 years old female with PMH significant for hypertension, COPD, dementia presented in the ED for further evaluation of altered mental status related to her baseline dementia for 2 to 3 days.  Patient presented here with worsening confusion and multiple falls without loss of consciousness, although family notes that few of these falls have been unwitnessed.  Family also reports patient has exhibited evidence of left lower extremity weakness resulting in some dragging of left leg for greater than a month.  In the ED UA consistent with pyuria and bacteriuria.  CT head showed no evidence of acute intracranial abnormality.  CT cervical spine showed nonspecific neck mass. Patient was admitted for further evaluation and ENT was consulted for neck mass.  Assessment & Plan:   Principal Problem:   Acute encephalopathy Active Problems:   Neck mass  Acute encephalopathy: > Improved. Likely Acute cystitis: Initiated on IV ceftriaxone. Urine cultures contaminated.  Antibiotics discontinued after 3 days. 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 Back to her baseline mental status.   Recurrent falls: High suspicion for deconditioning: No obvious focal deficits on exam. PT/OT consulted; apprec eval/recs > SNF pending insurance auth.  Neck mass: ENT consulted (Dr. Penne Croak); apprec eval/recs. 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 Outpatient follow-up recommended.   Essential HTN: Continue Irbesartan 75mg  daily (in lieu pta valsartan 80mg  daily).   Hyperlipidemia:  Continue atorvastatin .   COPD: Continue Duonebs prn.  Hypomagnesemia: Replaced.  Continue to monitor.  Leukocytosis: Obtain portable chest x-ray and UA to rule out any infection.   DVT prophylaxis: SCDs Code Status: Full  code Family Communication: No family at Bed side. Disposition Plan:    Status is: Inpatient Remains inpatient appropriate because: Patient is medically clear,  awaiting placement in acute inpatient rehab/ SNF  Consultants:  None  Procedures:  MRI Brain.  Antimicrobials:  Anti-infectives (From admission, onward)    Start     Dose/Rate Route Frequency Ordered Stop   12/10/23 1100  cefTRIAXone (ROCEPHIN) 1 g in sodium chloride  0.9 % 100 mL IVPB        1 g 200 mL/hr over 30 Minutes Intravenous Every 24 hours 12/10/23 1009 12/14/23 1014      Subjective: Patient was seen and examined at bedside. Overnight events noted. Patient is back to her baseline mental status. RN reports having poor appetite. She is awaiting placement in rehab facility,  pending insurance authorization.  Objective: Vitals:   12/18/23 2359 12/19/23 0421 12/19/23 0831 12/19/23 1102  BP: (!) 149/73 137/66 125/75 130/77  Pulse: 94 94 83 84  Resp:   18   Temp: 99.4 F (37.4 C) 99.8 F (37.7 C) 98.2 F (36.8 C)   TempSrc:      SpO2: 100% 98% 100%   Weight:      Height:        Intake/Output Summary (Last 24 hours) at 12/19/2023 1607 Last data filed at 12/19/2023 0730 Gross per 24 hour  Intake --  Output 725 ml  Net -725 ml   Filed Weights   12/15/23 0103 12/17/23 0500 12/18/23 0500  Weight: 93.8 kg 94.1 kg 93 kg    Examination:  General exam: Appears calm and comfortable, deconditioned , not in any acute distress. Respiratory system: CTA Bilaterally. Respiratory effort normal.  RR 14 Cardiovascular system: S1 & S2 heard,  RRR. No JVD, murmurs, rubs, gallops or clicks.  Gastrointestinal system: Abdomen is non distended, soft and non tender.  Normal bowel sounds heard. Central nervous system: Alert and oriented x 2. No focal neurological deficits. Extremities: No edema, no cyanosis, no clubbing. Skin: No rashes, lesions or ulcers Psychiatry: Judgement and insight appear normal. Mood & affect  appropriate.   Data Reviewed: I have personally reviewed following labs and imaging studies  CBC: Recent Labs  Lab 12/15/23 0520 12/18/23 1004  WBC 9.8 13.7*  HGB 12.6 12.1  HCT 38.2 36.9  MCV 89.0 88.5  PLT 229 261   Basic Metabolic Panel: Recent Labs  Lab 12/15/23 0520 12/18/23 1004  NA 138 138  K 4.2 4.4  CL 102 103  CO2 25 21*  GLUCOSE 94 130*  BUN 12 28*  CREATININE 0.95 1.16*  CALCIUM  9.5 9.6  MG 1.6* 1.8  PHOS 2.5 3.1   GFR: Estimated Creatinine Clearance: 47.7 mL/min (A) (by C-G formula based on SCr of 1.16 mg/dL (H)). Liver Function Tests: No results for input(s): AST, ALT, ALKPHOS, BILITOT, PROT, ALBUMIN in the last 168 hours.  No results for input(s): LIPASE, AMYLASE in the last 168 hours. No results for input(s): AMMONIA in the last 168 hours. Coagulation Profile: No results for input(s): INR, PROTIME in the last 168 hours. Cardiac Enzymes: No results for input(s): CKTOTAL, CKMB, CKMBINDEX, TROPONINI in the last 168 hours. BNP (last 3 results) No results for input(s): PROBNP in the last 8760 hours. HbA1C: No results for input(s): HGBA1C in the last 72 hours. CBG: No results for input(s): GLUCAP in the last 168 hours.  Lipid Profile: No results for input(s): CHOL, HDL, LDLCALC, TRIG, CHOLHDL, LDLDIRECT in the last 72 hours. Thyroid  Function Tests: No results for input(s): TSH, T4TOTAL, FREET4, T3FREE, THYROIDAB in the last 72 hours.  Anemia Panel: No results for input(s): VITAMINB12, FOLATE, FERRITIN, TIBC, IRON, RETICCTPCT in the last 72 hours. Sepsis Labs: No results for input(s): PROCALCITON, LATICACIDVEN in the last 168 hours.  No results found for this or any previous visit (from the past 240 hours).   Radiology Studies: DG CHEST PORT 1 VIEW Result Date: 12/19/2023 EXAM: 1 VIEW(S) XRAY OF THE CHEST 12/19/2023 09:00:00 AM COMPARISON: Comparison radiographs  12/09/2023. CLINICAL HISTORY: Aspiration pneumonia. FINDINGS: LUNGS AND PLEURA: Low lung volumes. The patient is rotated to the right. Minimal streaky right greater than left basilar opacities could reflect bronchovascular crowding, atelectasis, or developing infiltrate. No pulmonary edema. No pleural effusion. No pneumothorax. HEART AND MEDIASTINUM: No acute abnormality of the cardiac and mediastinal silhouettes. BONES AND SOFT TISSUES: No acute osseous abnormality. IMPRESSION: 1. Low lung volumes. Minimal streaky right greater than left basilar opacities could reflect bronchovascular crowding, atelectasis, or developing infiltrate. Electronically signed by: Shahmeer Marcelino MD 12/19/2023 01:43 PM EST RP Workstation: [id number]     Scheduled Meds:  atorvastatin   80 mg Oral q morning   ezetimibe   10 mg Oral Daily   irbesartan  75 mg Oral Daily   polyethylene glycol  17 g Oral Daily   senna-docusate  1 tablet Oral BID   Continuous Infusions:      LOS: 9 days    Time spent: 35 mins    Darcel Dawley, MD [facility]   If 7PM-7AM, please contact night-coverage

## 2023-12-19 NOTE — TOC Progression Note (Signed)
 Transition of Care [facility]) - Progression Note    Patient Details  Name: Angela Morrison MRN: [id number] Date of Birth: [birth year]  [facility] CM/SW Contact  Sherline Clack, [place] Phone Number: 12/19/2023, 4:45 PM  Clinical Narrative:     CSW attempted to call patient's daughter, [name], twice with no answer. CSW left a VM with [name] and sent her a text message requesting to hear back about SNF choice. [name] attempted to call CSW while CSW was in a patient room and was not able to pick up the phone when CSW attempted to call back. CSW will reach out again tomorrow.   Expected Discharge Plan: Skilled Nursing Facility Barriers to Discharge: SNF Pending bed offer               Expected Discharge Plan and Services                                               Social Drivers of Health (SDOH) Interventions SDOH Screenings   Food Insecurity: No Food Insecurity (12/13/2023)  Housing: Low Risk  (12/13/2023)  Transportation Needs: Patient Unable To Answer (12/13/2023)  Utilities: Patient Unable To Answer (12/13/2023)  Depression (PHQ2-9): Medium Risk (05/23/2019)  Social Connections: Patient Unable To Answer (12/13/2023)  Tobacco Use: Low Risk  (12/09/2023)    Readmission Risk Interventions    12/13/2023    3:40 PM  Readmission Risk Prevention Plan  Post Dischage Appt Complete  Medication Screening Complete  Transportation Screening Complete

## 2023-12-20 ENCOUNTER — Inpatient Hospital Stay (HOSPITAL_COMMUNITY)

## 2023-12-20 DIAGNOSIS — I739 Peripheral vascular disease, unspecified: Secondary | ICD-10-CM

## 2023-12-20 DIAGNOSIS — R5383 Other fatigue: Secondary | ICD-10-CM | POA: Diagnosis not present

## 2023-12-20 DIAGNOSIS — R4182 Altered mental status, unspecified: Secondary | ICD-10-CM

## 2023-12-20 DIAGNOSIS — I6340 Cerebral infarction due to embolism of unspecified cerebral artery: Secondary | ICD-10-CM

## 2023-12-20 DIAGNOSIS — R531 Weakness: Secondary | ICD-10-CM

## 2023-12-20 DIAGNOSIS — G9340 Encephalopathy, unspecified: Secondary | ICD-10-CM | POA: Diagnosis not present

## 2023-12-20 DIAGNOSIS — F0390 Unspecified dementia without behavioral disturbance: Secondary | ICD-10-CM

## 2023-12-20 DIAGNOSIS — R296 Repeated falls: Secondary | ICD-10-CM | POA: Diagnosis not present

## 2023-12-20 DIAGNOSIS — R29706 NIHSS score 6: Secondary | ICD-10-CM

## 2023-12-20 LAB — URINALYSIS, ROUTINE W REFLEX MICROSCOPIC
Bilirubin Urine: NEGATIVE
Glucose, UA: NEGATIVE mg/dL
Hgb urine dipstick: NEGATIVE
Ketones, ur: NEGATIVE mg/dL
Nitrite: NEGATIVE
Protein, ur: NEGATIVE mg/dL
Specific Gravity, Urine: 1.02 (ref 1.005–1.030)
pH: 5 (ref 5.0–8.0)

## 2023-12-20 LAB — BASIC METABOLIC PANEL WITH GFR
Anion gap: 12 (ref 5–15)
BUN: 19 mg/dL (ref 8–23)
CO2: 24 mmol/L (ref 22–32)
Calcium: 9.8 mg/dL (ref 8.9–10.3)
Chloride: 104 mmol/L (ref 98–111)
Creatinine, Ser: 0.9 mg/dL (ref 0.44–1.00)
GFR, Estimated: >60 mL/min (ref >60)
Glucose, Bld: 110 mg/dL — ABNORMAL HIGH (ref 70–99)
Potassium: 4.5 mmol/L (ref 3.5–5.1)
Sodium: 140 mmol/L (ref 135–145)

## 2023-12-20 LAB — MAGNESIUM: Magnesium: 1.9 mg/dL (ref 1.7–2.4)

## 2023-12-20 LAB — CBC
HCT: 36.9 % (ref 36.0–46.0)
Hemoglobin: 12.3 g/dL (ref 12.0–15.0)
MCH: 29.8 pg (ref 26.0–34.0)
MCHC: 33.3 g/dL (ref 30.0–36.0)
MCV: 89.3 fL (ref 80.0–100.0)
Platelets: 269 K/uL (ref 150–400)
RBC: 4.13 MIL/uL (ref 3.87–5.11)
RDW: 14.9 % (ref 11.5–15.5)
WBC: 14.2 K/uL — ABNORMAL HIGH (ref 4.0–10.5)
nRBC: 0 % (ref 0.0–0.2)

## 2023-12-20 LAB — LIPID PANEL
Cholesterol: 144 mg/dL (ref 0–200)
HDL: 37 mg/dL — ABNORMAL LOW (ref >40)
LDL Cholesterol: 95 mg/dL (ref 0–99)
Total CHOL/HDL Ratio: 3.9 ratio
Triglycerides: 62 mg/dL (ref <150)
VLDL: 12 mg/dL (ref 0–40)

## 2023-12-20 LAB — HEMOGLOBIN A1C
Hgb A1c MFr Bld: 5.8 % — ABNORMAL HIGH (ref 4.8–5.6)
Mean Plasma Glucose: 119.76 mg/dL

## 2023-12-20 LAB — PHOSPHORUS: Phosphorus: 2.5 mg/dL (ref 2.5–4.6)

## 2023-12-20 MED ORDER — DOXYCYCLINE HYCLATE 100 MG PO TABS
100.0000 mg | ORAL_TABLET | Freq: Two times a day (BID) | ORAL | Status: AC
  Administered 2023-12-20 – 2023-12-24 (×10): 100 mg via ORAL
  Filled 2023-12-20 (×10): qty 1

## 2023-12-20 MED ORDER — STROKE: EARLY STAGES OF RECOVERY BOOK
Freq: Once | Status: AC
  Filled 2023-12-20: qty 1

## 2023-12-20 MED ORDER — ASPIRIN 81 MG PO TBEC
81.0000 mg | DELAYED_RELEASE_TABLET | Freq: Every day | ORAL | Status: DC
  Administered 2023-12-20 – 2023-12-25 (×6): 81 mg via ORAL
  Filled 2023-12-20 (×6): qty 1

## 2023-12-20 MED ORDER — AMOXICILLIN-POT CLAVULANATE 875-125 MG PO TABS
1.0000 | ORAL_TABLET | Freq: Two times a day (BID) | ORAL | Status: AC
  Administered 2023-12-20 – 2023-12-24 (×10): 1 via ORAL
  Filled 2023-12-20 (×10): qty 1

## 2023-12-20 NOTE — Progress Notes (Signed)
 Physical Therapy Treatment Patient Details Name: Angela Morrison MRN: [id number] DOB: [birth year] Today's Date: 12/20/2023   History of Present Illness 73 year old female presenting to ED 11/01 with weakness and at least 4 falls in the last 24 hours, as well as inability to care for herself in the home. Labs notable for WBC 12.2. UA w/ pyuria and bacteruira. CT cervical spine without contrast showed a nonspecific neck mass. 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    PMH: HTN, HLD, CAD, obesity, DM2, depression, arthritis, vitamin D deficiency, vascular dementia    PT Comments  Patient with R lateral lean seated EOB mod A for balance and over time slight improvement.  She stood x 5 to RW with +2 A for safety due to imbalance and not achieving full upright posture staying flexed and with anterior weight shift.  She declined OOB and was not standing long enough to encourage lateral weight shifts or stepping.  Patient fatigued so assisted back to supine.  Concern for stroke extension from prior session when she needed only min A to stand and could take steps with RW.  PT will continue to follow.    If plan is discharge home, recommend the following: A lot of help with walking and/or transfers;A lot of help with bathing/dressing/bathroom;Assistance with cooking/housework;Direct supervision/assist for medications management;Direct supervision/assist for financial management;Assist for transportation;Help with stairs or ramp for entrance;Supervision due to cognitive status   Can travel by private vehicle     No  Equipment Recommendations  Wheelchair cushion (measurements PT);Wheelchair (measurements PT);Hoyer lift;Hospital bed    Recommendations for Other Services       Precautions / Restrictions Precautions Precautions: Fall Recall of Precautions/Restrictions:  Impaired     Mobility  Bed Mobility Overal bed mobility: Needs Assistance Bed Mobility: Supine to Sit, Sit to Supine     Supine to sit: Max assist Sit to supine: Max assist, +2 for physical assistance   General bed mobility comments: up to EOB with cues and increased time to reach to rail with L UE (pt holding rail with R UE throughout) assist for legs off EOB (R needing more assist)    Transfers Overall transfer level: Needs assistance Equipment used: Rolling walker (2 wheels) Transfers: Sit to/from Stand Sit to Stand: Mod assist, +2 physical assistance           General transfer comment: stood only x 5 from EOB due to pt flexed, with R lateral and anterior lean and not able to take steps    Ambulation/Gait                   Stairs             Wheelchair Mobility     Tilt Bed    Modified Rankin (Stroke Patients Only) Modified Rankin (Stroke Patients Only) Pre-Morbid Rankin Score: Moderate disability Modified Rankin: Severe disability     Balance Overall balance assessment: Needs assistance   Sitting balance-Leahy Scale: Poor Sitting balance - Comments: R lateral lean mod A for balance progressing to min A at times Postural control: Right lateral lean Standing balance support: Bilateral upper extremity supported Standing balance-Leahy Scale: Zero Standing balance comment: +2 for standing today                            Communication Communication Communication:  No apparent difficulties  Cognition Arousal: Alert Behavior During Therapy: Flat affect   PT - Cognitive impairments: History of cognitive impairments                       PT - Cognition Comments: multimodal cues and increased time for following commands Following commands: Impaired Following commands impaired: Follows one step commands with increased time, Follows one step commands inconsistently    Cueing Cueing Techniques: Verbal cues, Gestural cues,  Visual cues, Tactile cues  Exercises      General Comments General comments (skin integrity, edema, etc.): no c/o dizziness though due to R lateral lean continued to encourage focal point ahead of her for visual compensatory and orientation techniques      Pertinent Vitals/Pain Pain Assessment Faces Pain Scale: Hurts little more Pain Location: R LE with movement Pain Descriptors / Indicators: Sore, Grimacing, Guarding Pain Intervention(s): Monitored during session, Repositioned    Home Living                          Prior Function            PT Goals (current goals can now be found in the care plan section) Progress towards PT goals: Progressing toward goals    Frequency    Min 2X/week      PT Plan      Co-evaluation              AM-PAC PT 6 Clicks Mobility   Outcome Measure  Help needed turning from your back to your side while in a flat bed without using bedrails?: A Lot Help needed moving from lying on your back to sitting on the side of a flat bed without using bedrails?: Total Help needed moving to and from a bed to a chair (including a wheelchair)?: Total Help needed standing up from a chair using your arms (e.g., wheelchair or bedside chair)?: Total Help needed to walk in hospital room?: Total Help needed climbing 3-5 steps with a railing? : Total 6 Click Score: 7    End of Session Equipment Utilized During Treatment: Gait belt Activity Tolerance: Patient limited by fatigue Patient left: in bed;with call bell/phone within reach;with bed alarm set   PT Visit Diagnosis: Other abnormalities of gait and mobility (R26.89);History of falling (Z91.81);Dizziness and giddiness (R42);Other symptoms and signs involving the nervous system (R29.898)     Time: 8482-8459 PT Time Calculation (min) (ACUTE ONLY): 23 min  Charges:    $Therapeutic Activity: 23-37 mins PT General Charges $$ ACUTE PT VISIT: 1 Visit                     [name],  PT Acute Rehabilitation Services [phone] 12/20/2023    Angela Morrison 12/20/2023, 5:01 PM

## 2023-12-20 NOTE — Hospital Course (Signed)
 73 years old female with PMH significant for hypertension, COPD, dementia presented in the ED for further evaluation of altered mental status related to her baseline dementia for 2 to 3 days.  Patient presented here with worsening confusion and multiple falls without loss of consciousness, although family notes that few of these falls have been unwitnessed.  Family also reports patient has exhibited evidence of left lower extremity weakness resulting in some dragging of left leg for greater than a month.  In the ED UA consistent with pyuria and bacteriuria.  CT head showed no evidence of acute intracranial abnormality.  CT cervical spine showed nonspecific neck mass. Patient was admitted for further evaluation and ENT was consulted for neck mass.

## 2023-12-20 NOTE — TOC Progression Note (Addendum)
 Transition of Care [facility]) - Progression Note    Patient Details  Name: Angela Morrison MRN: [id number] Date of Birth: 1951-01-02  [facility]) CM/SW Contact  Sherline Clack, [place] Phone Number: 12/20/2023, 10:29 AM  Clinical Narrative:     Update 11:14 AM: Patient's daughter identified [facility] as their choice facility. CSW will start auth with [facility] today.   CSW spoke with daughter, [name], this morning regarding facility choice for SNF. [name] would like to go to [place] and [place] personally to see if the facilities are a good fit. CSW requested [name] let CSW know of her choice before the end of the day to be able to start auth. [name] agreed and will reach out to CSW by 4:30 pm today. CSW will continue to follow.   Expected Discharge Plan: Skilled Nursing Facility Barriers to Discharge: SNF Pending bed offer               Expected Discharge Plan and Services                                               Social Drivers of Health (SDOH) Interventions SDOH Screenings   Food Insecurity: No Food Insecurity (12/13/2023)  Housing: Low Risk  (12/13/2023)  Transportation Needs: Patient Unable To Answer (12/13/2023)  Utilities: Patient Unable To Answer (12/13/2023)  Depression (PHQ2-9): Medium Risk (05/23/2019)  Social Connections: Patient Unable To Answer (12/13/2023)  Tobacco Use: Low Risk  (12/09/2023)    Readmission Risk Interventions    12/13/2023    3:40 PM  Readmission Risk Prevention Plan  Post Dischage Appt Complete  Medication Screening Complete  Transportation Screening Complete

## 2023-12-20 NOTE — Progress Notes (Signed)
 Call received from Dr Garnette Pelt, MD, with concerns for infarct seen on admission MRI, no follow-ups. Patient had been treated for UTI during hospitalization. Dr Pelt requested STAT neuro checks. Patient approached and is RIGHT SIDED leaning in bed as had been through shift and previous day. Patient is alert and disoriented to year only, but correctly states [facility] and multiple falls prior to admission. Patient denies numbness/tingling to either side of body. Grip noted to be weak to the LEFT side, and LEFT leg unable to lift against resistance. When attempting further neuro work up, patient repeatedly stating no and refusing to engage in exam. Dr Pelt on unit shortly after and patient more engaging with MD in room. Dr Pelt stated he was awaiting call from neurology and would place additional orders. Dr. Pelt then messaged RN with neuro response: echocardiogram, bloodwork, STAT ASA 81, STAT CTH, neuro checks q4h.

## 2023-12-20 NOTE — Progress Notes (Incomplete)
 Please see orders for neuro checks to start today.

## 2023-12-20 NOTE — TOC Progression Note (Signed)
 Transition of Care [facility]) - Progression Note    Patient Details  Name: Angela Morrison MRN: [id number] Date of Birth: March 16, 1950  Transition of Care [facility]) CM/SW Contact  Sherline Clack, [place] Phone Number: 12/20/2023, 3:35 PM  Clinical Narrative:     CSW submitted insurance auth for patient. Currently pending, auth ID: [id number]. CSW will continue to follow.  Expected Discharge Plan: Skilled Nursing Facility Barriers to Discharge: SNF Pending bed offer               Expected Discharge Plan and Services                                               Social Drivers of Health (SDOH) Interventions SDOH Screenings   Food Insecurity: No Food Insecurity (12/13/2023)  Housing: Low Risk  (12/13/2023)  Transportation Needs: Patient Unable To Answer (12/13/2023)  Utilities: Patient Unable To Answer (12/13/2023)  Depression (PHQ2-9): Medium Risk (05/23/2019)  Social Connections: Patient Unable To Answer (12/13/2023)  Tobacco Use: Low Risk  (12/09/2023)    Readmission Risk Interventions    12/13/2023    3:40 PM  Readmission Risk Prevention Plan  Post Dischage Appt Complete  Medication Screening Complete  Transportation Screening Complete

## 2023-12-20 NOTE — Progress Notes (Signed)
  Progress Note   Patient: Angela Morrison FMW:[id number] DOB: Oct 29, 1950 DOA: 12/09/2023     10 DOS: the patient was seen and examined on 12/20/2023   Brief hospital course: 73 years old female with PMH significant for hypertension, COPD, dementia presented in the ED for further evaluation of altered mental status related to her baseline dementia for 2 to 3 days.  Patient presented here with worsening confusion and multiple falls without loss of consciousness, although family notes that few of these falls have been unwitnessed.  Family also reports patient has exhibited evidence of left lower extremity weakness resulting in some dragging of left leg for greater than a month.  In the ED UA consistent with pyuria and bacteriuria.  CT head showed no evidence of acute intracranial abnormality.  CT cervical spine showed nonspecific neck mass. Patient was admitted for further evaluation and ENT was consulted for neck mass.   Assessment and Plan: Acute subcentimeter infarct in post limb of R internal capsule, likely subacute infarcts in L external capsule and L occipital white matter -Noted on 11/2 MRI -PT reports pt was initially +2 min assist and ambulating, generally weak on L, however more weak on R with difficulty sitting at edge of bed -Currently not on ASA or plavix -Discussed case with Neurology who will see pt -Will give ASA 81mg  now. Ordered STAT head CT w/o contrast  -Have ordered lipid panel, A1c, 2d echo -Will order q4h neuro checks x 24h  Acute encephalopathy: > Improved. Likely Acute cystitis: Completed 3 days abx. Mentation does seem improved   Neck mass: ENT consulted (Dr. Penne Croak); apprec eval/recs. 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 Outpatient follow-up recommended.   Essential HTN: Continue Irbesartan 75mg  daily (in lieu pta valsartan 80mg  daily).   Hyperlipidemia:  Continued on atorvastatin .   COPD: Continue Duonebs prn.    Hypomagnesemia: Replaced.   Leukocytosis: WBC trending up to 14.2 today UA clear CXR with possible infiltrate. Start empiric doxy with augmentin       Subjective: Without complaints  Physical Exam: Vitals:   12/20/23 0500 12/20/23 0745 12/20/23 1158 12/20/23 1701  BP: 134/86 131/77 (!) 150/88 138/86  Pulse: 91 91 90 96  Resp:  18 18 18   Temp: 98.7 F (37.1 C) 99.1 F (37.3 C) 98.7 F (37.1 C) 98 F (36.7 C)  TempSrc:      SpO2: 100% 93% 98% 100%  Weight:      Height:       General exam: Awake, laying in bed, in nad Respiratory system: Normal respiratory effort, no wheezing Cardiovascular system: regular rate, s1, s2 Gastrointestinal system: Soft, nondistended, positive BS Central nervous system: CN2-12 grossly intact, general weakness on L Extremities: Perfused, no clubbing Skin: Normal skin turgor, no notable skin lesions seen Psychiatry: Mood normal // affect seems normal  Data Reviewed:  Labs reviewed: Na 140, K 4.5, Cr 0.90, WBC 14.2, Hgb 12.3, Plts 269  Family Communication: Pt in room, family not at bedside  Disposition: Status is: Inpatient Remains inpatient appropriate because: severity of illness  Planned Discharge Destination: Skilled nursing facility    Author: Garnette Pelt, MD 12/20/2023 5:11 PM  For on call review [email or web address].

## 2023-12-20 NOTE — Consult Note (Addendum)
 NEUROLOGY CONSULT NOTE   Date of service: December 20, 2023 Patient Name: Angela Morrison MRN:  [id number] DOB:  27-Aug-1950 Chief Complaint: strokes on MRI Brain Requesting Provider: Cindy Garnette POUR, MD  History of Present Illness  Angela Morrison is a 73 y.o. female with hx of CAD, HTN, HLD, hypothroidism, COPD, dementia p/w confusion in the setting of her hx of dementia along with left lower ext weakness.  She was treated with IV ceftriaxone for cystitis with improvement and MRI Brain demonstrated embolic appearing infarcts vs concurrent small vessel disease.  Neurology consulted for further eval and workup.  Pt reports that she has been falling a lot. This has been getting worse over the last year. She is now falling about once a day. She trips over things and feels like her knees give out. She is a poor historian and does not volunteer history and requires direct questions to get her to provide any history.  Spoke with her husband who resports progressive weakness but was able to walk 2 flights of stairs up althou her left leg was weaker than right and she did struggle. She infact was able to walk up on Friday night 12/08/23. On the day they came here, she slid from recliner and unable to getup. Had to call other family members to come and get her up. She had urinated on herself that day too.  She eats a lot of fast food and eats sweet stuff. Not very keen on taking her medications.  LKW: 12/08/23. Modified rankin score: 3-Moderate disability-requires help but walks WITHOUT assistance IV Thrombolysis: not offered, outside window EVT: not offered, outside window and low suspicion for LVO  NIHSS components Score: Comment  1a Level of Conscious 0[x]  1[]  2[]  3[]      1b LOC Questions 0[x]  1[]  2[]       1c LOC Commands 0[x]  1[]  2[]       2 Best Gaze 0[x]  1[]  2[]       3 Visual 0[x]  1[]  2[]  3[]      4 Facial Palsy 0[x]  1[]  2[]  3[]      5a Motor Arm - left 0[x]  1[]  2[]  3[]  4[]  UN[]    5b  Motor Arm - Right 0[x]  1[]  2[]  3[]  4[]  UN[]    6a Motor Leg - Left 0[]  1[]  2[]  3[x]  4[]  UN[]    6b Motor Leg - Right 0[]  1[]  2[]  3[x]  4[]  UN[]    7 Limb Ataxia 0[x]  1[]  2[]  UN[]      8 Sensory 0[x]  1[]  2[]  UN[]      9 Best Language 0[x]  1[]  2[]  3[]      10 Dysarthria 0[x]  1[]  2[]  UN[]      11 Extinct. and Inattention 0[x]  1[]  2[]       TOTAL: 6      ROS  Comprehensive ROS performed and pertinent positives documented in HPI   Past History   Past Medical History:  Diagnosis Date   Anemia    few yrs ago none recent   Arthritis    Constipation    Coronary artery disease 2005   S/P PCI of RCA   Depression    Diabetes mellitus without complication (HCC)    type 2   Edema, lower extremity    History of MI (myocardial infarction)    Hyperlipidemia    Hypertension    Leg cramps    Lower back pain    Obesity    PONV (postoperative nausea and vomiting)    Shoulder pain    Thyroid  nodule  S/P bx 8/13 c/w goiter   Vitamin D deficiency    Weakness     Past Surgical History:  Procedure Laterality Date   appendectomy Right    BACK SURGERY     lower back   CHOLECYSTECTOMY N/A 07/02/2017   Procedure: LAPAROSCOPIC CHOLECYSTECTOMY WITH INTRAOPERATIVE CHOLANGIOGRAM;  Surgeon: Mikell Katz, MD;  Location: [facility];  Service: General;  Laterality: N/A;   COLONOSCOPY WITH PROPOFOL  N/A 05/17/2016   Procedure: COLONOSCOPY WITH PROPOFOL ;  Surgeon: Gladis MARLA Louder, MD;  Location: [facility];  Service: Endoscopy;  Laterality: N/A;    Family History: Family History  Problem Relation Age of Onset   Alzheimer's disease Mother    Hypertension Mother    Dementia Father    Hypertension Father    Alcoholism Father     Social History  reports that she has never smoked. She has never used smokeless tobacco. She reports current alcohol use. She reports that she does not use drugs.  Allergies  Allergen Reactions   Crestor [Rosuvastatin]     constipation   Morphine  Sulfate Other (See  Comments)   Penicillins Itching    Medications   Current Facility-Administered Medications:    acetaminophen  (TYLENOL ) tablet 650 mg, 650 mg, Oral, Q6H PRN, 650 mg at 12/12/23 1215 **OR** acetaminophen  (TYLENOL ) suppository 650 mg, 650 mg, Rectal, Q6H PRN, Howerter, Justin B, DO   amoxicillin-clavulanate (AUGMENTIN) 875-125 MG per tablet 1 tablet, 1 tablet, Oral, Q12H, Cindy Garnette MARLA, MD, 1 tablet at 12/20/23 0945   aspirin  EC tablet 81 mg, 81 mg, Oral, Daily, Cindy Garnette MARLA, MD, 81 mg at 12/20/23 1858   atorvastatin  (LIPITOR) tablet 80 mg, 80 mg, Oral, q morning, Georgina Basket, MD, 80 mg at 12/20/23 0946   doxycycline (VIBRA-TABS) tablet 100 mg, 100 mg, Oral, Q12H, Cindy Garnette MARLA, MD, 100 mg at 12/20/23 0945   ezetimibe  (ZETIA ) tablet 10 mg, 10 mg, Oral, Daily, Georgina Basket, MD, 10 mg at 12/20/23 0945   ipratropium-albuterol (DUONEB) 0.5-2.5 (3) MG/3ML nebulizer solution 3 mL, 3 mL, Nebulization, Q6H PRN, Georgina Basket, MD   LORazepam (ATIVAN) tablet 0.5 mg, 0.5 mg, Oral, PRN, Jerrol Agent, MD   ondansetron  (ZOFRAN ) injection 4 mg, 4 mg, Intravenous, Q6H PRN, Leotis Bogus, MD, 4 mg at 12/12/23 1718   ondansetron  (ZOFRAN ) tablet 4 mg, 4 mg, Oral, Q6H PRN, Leotis, Pardeep, MD   polyethylene glycol (MIRALAX / GLYCOLAX) packet 17 g, 17 g, Oral, Daily, Leotis, Pardeep, MD, 17 g at 12/19/23 1103   senna-docusate (Senokot-S) tablet 1 tablet, 1 tablet, Oral, BID, Leotis Bogus, MD, 1 tablet at 12/19/23 1103  Vitals   Vitals:   12/20/23 0745 12/20/23 1158 12/20/23 1701 12/20/23 1956  BP: 131/77 (!) 150/88 138/86 131/76  Pulse: 91 90 96 90  Resp: 18 18 18    Temp: 99.1 F (37.3 C) 98.7 F (37.1 C) 98 F (36.7 C) 98.1 F (36.7 C)  TempSrc:      SpO2: 93% 98% 100% 100%  Weight:      Height:        Body mass index is 35.19 kg/m.   Physical Exam   General: Laying comfortably in bed; in no acute distress.  HENT: Normal oropharynx and mucosa. Normal external appearance of ears  and nose.  Neck: Supple, no pain or tenderness  CV: No JVD. No peripheral edema.  Pulmonary: Symmetric Chest rise. Normal respiratory effort.  Abdomen: Soft to touch, non-tender.  Ext: No cyanosis, edema, or deformity  Skin: No rash. Normal  palpation of skin.   Musculoskeletal: Normal digits and nails by inspection. No clubbing.   Neurologic Examination  Mental status/Cognition: Alert, oriented to self, place, month and year, good attention.  Speech/language: Fluent, comprehension intact, object naming intact, repetition intact.  Cranial nerves:   CN II Pupils equal and reactive to light, no VF deficits    CN III,IV,VI EOM intact, no gaze preference or deviation, no nystagmus    CN V normal sensation in V1, V2, and V3 segments bilaterally    CN VII no asymmetry, no nasolabial fold flattening    CN VIII normal hearing to speech   CN IX & X normal palatal elevation, no uvular deviation    CN XI 5/5 head turn and 5/5 shoulder shrug bilaterally    CN XII midline tongue protrusion    Motor:  Muscle bulk: normal, tone normal, pronator drift none tremor none Mvmt Root Nerve  Muscle Right Left Comments  SA C5/6 Ax Deltoid 5 5   EF C5/6 Mc Biceps 5 5   EE C6/7/8 Rad Triceps 5 5   WF C6/7 Med FCR     WE C7/8 PIN ECU     F Ab C8/T1 U ADM/FDI 5 5   HF L1/2/3 Fem Illopsoas 2 2   KE L2/3/4 Fem Quad 3 3   DF L4/5 D Peron Tib Ant 4 4   PF S1/2 Tibial Grc/Sol 4 4    Reflexes:  Right Left Comments  Pectoralis      Biceps (C5/6) 2 2   Brachioradialis (C5/6) 2 2    Triceps (C6/7) 2 2    Patellar (L3/4) 3 3 Cross adductors positive   Achilles (S1) 2 2    Hoffman      Plantar withdraws withdraws   Jaw jerk    Sensation:  Light touch Grossly intact throughout   Pin prick    Temperature    Vibration Intact in BL big toes.  Proprioception    Coordination/Complex Motor:  - Finger to Nose intact BL - Heel to shin unable to do - Rapid alternating movement are slowed throughout - Gait:  deferred for patient safety.  Labs/Imaging/Neurodiagnostic studies   CBC:  Recent Labs  Lab 01/10/24 1004 12/20/23 0202  WBC 13.7* 14.2*  HGB 12.1 12.3  HCT 36.9 36.9  MCV 88.5 89.3  PLT 261 269   Basic Metabolic Panel:  Lab Results  Component Value Date   NA 140 12/20/2023   K 4.5 12/20/2023   CO2 24 12/20/2023   GLUCOSE 110 (H) 12/20/2023   BUN 19 12/20/2023   CREATININE 0.90 12/20/2023   CALCIUM  9.8 12/20/2023   GFRNONAA >60 12/20/2023   GFRAA 105 07/24/2019   Lipid Panel:  Lab Results  Component Value Date   LDLCALC 95 12/20/2023   HgbA1c:  Lab Results  Component Value Date   HGBA1C 5.8 (H) 12/20/2023   Urine Drug Screen: No results found for: LABOPIA, COCAINSCRNUR, LABBENZ, AMPHETMU, THCU, LABBARB  Alcohol Level No results found for: [facility] INR  Lab Results  Component Value Date   INR 0.97 08/16/2011   APTT No results found for: APTT AED levels: No results found for: PHENYTOIN, ZONISAMIDE, LAMOTRIGINE, LEVETIRACETA  CT Head without contrast(Personally reviewed): CTH was negative for a large hypodensity concerning for a large territory infarct or hyperdensity concerning for an ICH  CT angio Head and Neck with contrast(Personally reviewed): pending  MR Neck soft tissue(Personally reviewed): No cord compression noted.  MRI Brain(Personally reviewed): 1. Acute subcentimeter  infarct in the posterior limb of the right internal capsule. 2. Likely subacute infarcts in the left external capsule and left occipital white matter. 3. Severe chronic small vessel ischemic disease.   ASSESSMENT   SAFAA STINGLEY is a 73 y.o. female with hx of CAD, HTN, HLD, hypothroidism, COPD, dementia p/w confusion in the setting of her hx of dementia along with left lower ext weakness.  She was treated with IV ceftriaxone for cystitis with improvement and MRI Brain demonstrated embolic appearing infarcts vs concurrent small vessel  disease.  Neurology consulted for further eval and workup.  Etiology suspect embolic vs concurrent small vessel disease.  Neuro exam with significant BL lower ext weakness. Weakness is out of proportion to what I would expect from strokes.  RECOMMENDATIONS  - Frequent Neuro checks per stroke unit protocol - Recommend Vascular imaging with CTA head and neck - Recommend obtaining TTE  - Recommend obtaining Lipid panel with LDL - Please start statin if LDL > 70 - Recommend HbA1c to evaluate for diabetes and how well it is controlled. - Antithrombotic - Aspirin  81mg  daily for now. - Recommend DVT ppx - SBP goal - permissive hypertension first 24 h < 220/110. Held home meds.  - Recommend Telemetry monitoring for arrythmia - Recommend bedside swallow screen prior to PO intake. - Stroke education booklet - Recommend PT/OT/SLP consult - Recommend Urine Tox screen. - B12, folate, TSH, thiamine, Copper, Ceruloplasmin, Zinc - MRI T and L spine without contrast given BL lower ext weakness with hyperreflexia at the patella.  ______________________________________________________________________  Plan discussed with patient's husband Mr. Angela Morrison over phone. Plan also discussed with patient at the bedside. Discussed briefly with Dr. Charlton with the Overnight Hospitalist team.  Husband is requesting that he will be at PT appointment starting 1230 PM tomorrow and requesting an update prior to that.  I personally spent a total of 75 minutes in the care of the patient today including preparing to see the patient, getting/reviewing separately obtained history, performing a medically appropriate exam/evaluation, counseling and educating, placing orders, referring and communicating with other health care professionals, documenting clinical information in the EHR, independently interpreting results, communicating results, and coordinating care.   Signed, Viann Nielson, MD [facility]

## 2023-12-20 NOTE — Plan of Care (Signed)

## 2023-12-21 ENCOUNTER — Inpatient Hospital Stay (HOSPITAL_COMMUNITY)

## 2023-12-21 DIAGNOSIS — I6389 Other cerebral infarction: Secondary | ICD-10-CM | POA: Diagnosis not present

## 2023-12-21 DIAGNOSIS — R5383 Other fatigue: Secondary | ICD-10-CM | POA: Diagnosis not present

## 2023-12-21 DIAGNOSIS — G9340 Encephalopathy, unspecified: Secondary | ICD-10-CM | POA: Diagnosis not present

## 2023-12-21 DIAGNOSIS — E785 Hyperlipidemia, unspecified: Secondary | ICD-10-CM

## 2023-12-21 DIAGNOSIS — I639 Cerebral infarction, unspecified: Secondary | ICD-10-CM

## 2023-12-21 DIAGNOSIS — I10 Essential (primary) hypertension: Secondary | ICD-10-CM

## 2023-12-21 DIAGNOSIS — R4182 Altered mental status, unspecified: Secondary | ICD-10-CM | POA: Diagnosis not present

## 2023-12-21 DIAGNOSIS — R296 Repeated falls: Secondary | ICD-10-CM | POA: Diagnosis not present

## 2023-12-21 LAB — COMPREHENSIVE METABOLIC PANEL WITH GFR
ALT: 64 U/L — ABNORMAL HIGH (ref 0–44)
AST: 42 U/L — ABNORMAL HIGH (ref 15–41)
Albumin: 2.6 g/dL — ABNORMAL LOW (ref 3.5–5.0)
Alkaline Phosphatase: 97 U/L (ref 38–126)
Anion gap: 10 (ref 5–15)
BUN: 17 mg/dL (ref 8–23)
CO2: 24 mmol/L (ref 22–32)
Calcium: 9.7 mg/dL (ref 8.9–10.3)
Chloride: 103 mmol/L (ref 98–111)
Creatinine, Ser: 0.94 mg/dL (ref 0.44–1.00)
GFR, Estimated: >60 mL/min (ref >60)
Glucose, Bld: 109 mg/dL — ABNORMAL HIGH (ref 70–99)
Potassium: 4.1 mmol/L (ref 3.5–5.1)
Sodium: 137 mmol/L (ref 135–145)
Total Bilirubin: 1.2 mg/dL (ref 0.0–1.2)
Total Protein: 7 g/dL (ref 6.5–8.1)

## 2023-12-21 LAB — FOLATE: Folate: 5.1 ng/mL — ABNORMAL LOW (ref >5.9)

## 2023-12-21 LAB — CBC
HCT: 36.5 % (ref 36.0–46.0)
Hemoglobin: 12.1 g/dL (ref 12.0–15.0)
MCH: 29.6 pg (ref 26.0–34.0)
MCHC: 33.2 g/dL (ref 30.0–36.0)
MCV: 89.2 fL (ref 80.0–100.0)
Platelets: 259 K/uL (ref 150–400)
RBC: 4.09 MIL/uL (ref 3.87–5.11)
RDW: 14.8 % (ref 11.5–15.5)
WBC: 11.3 K/uL — ABNORMAL HIGH (ref 4.0–10.5)
nRBC: 0 % (ref 0.0–0.2)

## 2023-12-21 LAB — VITAMIN B12: Vitamin B-12: 440 pg/mL (ref 180–914)

## 2023-12-21 LAB — ECHOCARDIOGRAM COMPLETE
Area-P 1/2: 3.31 cm2
Height: 64 in
MV VTI: 1.65 cm2
S' Lateral: 2.6 cm
Weight: 3280.44 [oz_av]

## 2023-12-21 LAB — CK: Total CK: 83 U/L (ref 38–234)

## 2023-12-21 LAB — TSH: TSH: 1.771 u[IU]/mL (ref 0.350–4.500)

## 2023-12-21 MED ORDER — ENOXAPARIN SODIUM 40 MG/0.4ML IJ SOSY
40.0000 mg | PREFILLED_SYRINGE | INTRAMUSCULAR | Status: DC
  Administered 2023-12-21 – 2023-12-24 (×4): 40 mg via SUBCUTANEOUS
  Filled 2023-12-21 (×5): qty 0.4

## 2023-12-21 MED ORDER — IOHEXOL 350 MG/ML SOLN
75.0000 mL | Freq: Once | INTRAVENOUS | Status: AC | PRN
  Administered 2023-12-21: 75 mL via INTRAVENOUS

## 2023-12-21 MED ORDER — CLOPIDOGREL BISULFATE 75 MG PO TABS
75.0000 mg | ORAL_TABLET | Freq: Every day | ORAL | Status: DC
  Administered 2023-12-21 – 2023-12-25 (×5): 75 mg via ORAL
  Filled 2023-12-21 (×5): qty 1

## 2023-12-21 NOTE — Progress Notes (Addendum)
 Progress Note   Patient: Angela Morrison FMW:[id number] DOB: 18-Oct-1950 DOA: 12/09/2023     11 DOS: the patient was seen and examined on 12/21/2023   Brief hospital course: 73 years old female with PMH significant for hypertension, COPD, dementia presented in the ED for further evaluation of altered mental status related to her baseline dementia for 2 to 3 days.  Patient presented here with worsening confusion and multiple falls without loss of consciousness, although family notes that few of these falls have been unwitnessed.  Family also reports patient has exhibited evidence of left lower extremity weakness resulting in some dragging of left leg for greater than a month.  In the ED UA consistent with pyuria and bacteriuria.  CT head showed no evidence of acute intracranial abnormality.  CT cervical spine showed nonspecific neck mass. Patient was admitted for further evaluation and ENT was consulted for neck mass.   Assessment and Plan: Acute subcentimeter infarct in post limb of R internal capsule, likely subacute infarcts in L external capsule and L occipital white matter -Noted on 11/2 MRI -PT reports pt was initially +2 min assist and ambulating, generally weak on L, however more weak on R with difficulty sitting at edge of bed -initially had not been on ASA or plavix -Now on ASA 81mg . CT head w/o contrast 11/12 reviewed, no acute change -lipid panel with LDL 95 -A1c 5.8 - 2d echo performed, pending results -LE dopplers neg for DVT -CTA head/neck reviewed with no large vessel occlusion or stenosis. Some mild-mod calcified plaques noted in carotid siphons, supraclinoid segments, and V4 segments B. Incidental L thyroid  nodule >2cm  -Neurology/stroke team is now following. Limited repeat MRI ordered by Neuro, pending  Acute encephalopathy Possible Acute cystitis: Already completed 3 days empiric abx.   Neck mass: ENT consulted (Dr. Penne Croak); apprec eval/recs. 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 Outpatient follow-up recommended.   Essential HTN: Irbesartan 75mg  daily held given concerns of recent decline in strength In the setting of stroke   Hyperlipidemia:  Continued on atorvastatin .   COPD: Continue Duonebs prn.   Hypomagnesemia: Replaced.   Leukocytosis secondary to likely PNA WBC now trending down to 11k UA clear CXR with possible infiltrate. Cont empiric doxy with augmentin -Seen by SLP, rec for dysphagia 2  Thyroid  nodule ->2cm thyroid  nodule incidentally seen on CTA head/neck -Will check thyroid  US   -TSH 1.771    Subjective: No complaints this AM. Reports feeling better  Physical Exam: Vitals:   12/20/23 1956 12/20/23 2344 12/21/23 0728 12/21/23 1132  BP: 131/76 129/77 121/86 (!) 145/84  Pulse: 90 88 88 84  Resp:   18 18  Temp: 98.1 F (36.7 C) 98.6 F (37 C) 98.2 F (36.8 C) (!) 97.3 F (36.3 C)  TempSrc:   Oral Oral  SpO2: 100% 100% 97% 99%  Weight:      Height:       General exam: Conversant, in no acute distress Respiratory system: normal chest rise, clear, no audible wheezing Cardiovascular system: regular rhythm, s1-s2 Gastrointestinal system: Nondistended, nontender, pos BS Central nervous system: No seizures, no tremors Extremities: No cyanosis, no joint deformities Skin: No rashes, no pallor Psychiatry: Affect normal // mood seems normal   Data Reviewed:  Labs reviewed: Na 137, K 4.1, Cr 0.94, LDL 95, A1c 5.8, WBC 11.3, Hgb 12.1  Family Communication: Discussed in detail with pt's husband over phone on the evening of 11/12 and again during mid-morning of 11/13  Disposition: Status  is: Inpatient Remains inpatient appropriate because: severity of illness  Planned Discharge Destination: Skilled nursing facility    Author: Garnette Pelt, MD 12/21/2023 2:52 PM  For on call review [email or web address].

## 2023-12-21 NOTE — Progress Notes (Signed)
 Bilateral lower extremity venous duplex has been completed. Preliminary results can be found in CV Proc through chart review.   12/21/23 10:28 AM Cathlyn Collet RVT

## 2023-12-21 NOTE — Plan of Care (Signed)

## 2023-12-21 NOTE — Progress Notes (Signed)
 Echocardiogram Attempted exam at 955am - Vascular about to scan patient, will return afterwards.  Angela Morrison 12/21/2023, 9:57 AM

## 2023-12-21 NOTE — TOC Progression Note (Signed)
 Transition of Care [facility]) - Progression Note    Patient Details  Name: Angela Morrison MRN: [id number] Date of Birth: 07-Feb-1951  [facility] CM/SW Contact  Rosaline JONELLE Joe, RN Phone Number: 12/21/2023, 12:09 PM  Clinical Narrative:    Cm spoke with Dr. Cindy this morning and he states that the patient is not medically stable to discharge at this time.  MSW received insurance authorization back on the patient today but patient is not stable to discharge.  MD spoke with the patient's husband by phone for an update.  I called and left a message with the patient's daughter and she is aware that patient 's transfer to [facility] is on hold until patient is medically stable.   Expected Discharge Plan: Skilled Nursing Facility Barriers to Discharge: SNF Pending bed offer               Expected Discharge Plan and Services                                               Social Drivers of Health (SDOH) Interventions SDOH Screenings   Food Insecurity: No Food Insecurity (12/13/2023)  Housing: Low Risk  (12/13/2023)  Transportation Needs: Patient Unable To Answer (12/13/2023)  Utilities: Patient Unable To Answer (12/13/2023)  Depression (PHQ2-9): Medium Risk (05/23/2019)  Social Connections: Patient Unable To Answer (12/13/2023)  Tobacco Use: Low Risk  (12/09/2023)    Readmission Risk Interventions    12/13/2023    3:40 PM  Readmission Risk Prevention Plan  Post Dischage Appt Complete  Medication Screening Complete  Transportation Screening Complete

## 2023-12-21 NOTE — Evaluation (Signed)
 Clinical/Bedside Swallow Evaluation Patient Details  Name: Angela Morrison MRN: [id number] Date of Birth: 1951/01/02  Today's Date: 12/21/2023 Time: SLP Start Time (ACUTE ONLY): 0931 SLP Stop Time (ACUTE ONLY): 0942 SLP Time Calculation (min) (ACUTE ONLY): 11 min  Past Medical History:  Past Medical History:  Diagnosis Date   Anemia    few yrs ago none recent   Arthritis    Constipation    Coronary artery disease 2005   S/P PCI of RCA   Depression    Diabetes mellitus without complication (HCC)    type 2   Edema, lower extremity    History of MI (myocardial infarction)    Hyperlipidemia    Hypertension    Leg cramps    Lower back pain    Obesity    PONV (postoperative nausea and vomiting)    Shoulder pain    Thyroid  nodule    S/P bx 8/13 c/w goiter   Vitamin D deficiency    Weakness    Past Surgical History:  Past Surgical History:  Procedure Laterality Date   appendectomy Right    BACK SURGERY     lower back   CHOLECYSTECTOMY N/A 07/02/2017   Procedure: LAPAROSCOPIC CHOLECYSTECTOMY WITH INTRAOPERATIVE CHOLANGIOGRAM;  Surgeon: Mikell Katz, MD;  Location: [facility];  Service: General;  Laterality: N/A;   COLONOSCOPY WITH PROPOFOL  N/A 05/17/2016   Procedure: COLONOSCOPY WITH PROPOFOL ;  Surgeon: Gladis MARLA Louder, MD;  Location: [facility];  Service: Endoscopy;  Laterality: N/A;   HPI:  73 year old female presenting to ED 11/01 with weakness and at least 4 falls in the last 24 hours, as well as inability to care for herself in the home. Labs notable for WBC 12.2. UA w/ pyuria and bacteruira. 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 MRI revealed acute subcentimeter infarct in the posterior limb of the right internal capsule and subacute infarcts in the left external capsule and  left occipital white matter. Pt with decline in function with PT 11/12 with concern for possible extension. CT Head negative. Initially passed a swallow screen 11/2; SLP evaluations ordered 11/12. PMH: HTN, HLD, CAD, obesity, DM2, depression, arthritis, vitamin D deficiency, vascular dementia    Assessment / Plan / Recommendation  Clinical Impression  Pt presents with oral more than pharyngeal signs of dysphagia, including cognitively-based symptoms such as oral holding. For this, she benefits from presentation of an empty spoon or straw or intermittent verbal cues to swallow. There are no overt s/s of aspiration. Recommend starting with Dys 2 (finely chopped) diet and thin liquids, with SLP f/u for potential to advance.   SLP Visit Diagnosis: Dysphagia, unspecified (R13.10)    Aspiration Risk       Diet Recommendation Dysphagia 2 (Fine chop);Thin liquid    Liquid Administration via: Cup;Straw Medication Administration: Crushed with puree Supervision: Staff to assist with self feeding;Full supervision/cueing for compensatory strategies Compensations: Slow rate;Small sips/bites;Minimize environmental distractions;Follow solids with liquid;Other (Comment) (cue to swallow PRN - may need to offer an empty spoon to help her initiate) Postural Changes: Seated upright at 90 degrees;Remain upright for at least 30 minutes after po intake    Other  Recommendations Oral Care Recommendations: Oral care BID     Assistance Recommended at Discharge    Functional Status Assessment    Frequency and Duration  min 2x/week  2 weeks       Prognosis Prognosis for improved oropharyngeal function: Good Barriers to Reach Goals: Cognitive deficits      Swallow Study   General HPI: 73 year old female presenting to ED 11/01 with weakness and at least 4 falls in the last 24 hours, as well as inability to care for herself in the home. Labs notable for WBC 12.2. UA w/ pyuria and bacteruira. CT cervical spine  without contrast showed a nonspecific neck mass (consistent with thyroglossal duct cyst, to f/u with ENT on an OP basis; laryngoscopy 11/2 with fullness on the L but good, bilateral movement of TVF and patent airway). 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 Pt with decline in function with PT 11/12 with concern for possible extension. CT Head negative. Initially passed a swallow screen 11/2; SLP evaluations ordered 11/12. PMH: HTN, HLD, CAD, obesity, DM2, depression, arthritis, vitamin D deficiency, vascular dementia Type of Study: Bedside Swallow Evaluation Previous Swallow Assessment: none in chart Diet Prior to this Study: NPO Temperature Spikes Noted: No Respiratory Status: Room air History of Recent Intubation: No Behavior/Cognition: Lethargic/Drowsy;Cooperative;Pleasant mood;Requires cueing Oral Cavity Assessment: Within Functional Limits Oral Care Completed by SLP: No Oral Cavity - Dentition: Edentulous Vision: Functional for self-feeding Self-Feeding Abilities: Total assist Patient Positioning: Upright in bed Baseline Vocal Quality: Normal Volitional Cough: Cognitively unable to elicit Volitional Swallow: Unable to elicit    Oral/Motor/Sensory Function     Ice Chips Ice chips: Not tested   Thin Liquid Thin Liquid: Impaired Presentation: Cup;Straw Oral Phase Functional Implications: Oral holding    Nectar Thick Nectar Thick Liquid: Not tested   Honey Thick Honey Thick Liquid: Not tested   Puree Puree: Impaired Presentation: Spoon Oral Phase Functional Implications: Oral holding   Solid     Solid: Impaired Oral Phase Functional Implications: Oral holding      Leita SAILOR., M.A. CCC-SLP Acute Rehabilitation Services Office: [phone]  Secure chat preferred  12/21/2023,10:14 AM

## 2023-12-21 NOTE — Evaluation (Signed)
 Occupational Therapy Evaluation Patient Details Name: Angela Morrison MRN: [id number] DOB: 10/23/1950 Today's Date: 12/21/2023   History of Present Illness   73 year old female presenting to ED 11/01 with weakness and at least 4 falls in the last 24 hours, as well as inability to care for herself in the home. UA w/ pyuria and bacteruira .Head CT clear  CT cervical spine without contrast showed a nonspecific neck mass. 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 Re-eval ordered 11/12 due to decline in function and stroke work up.   PMH: HTN, HLD, CAD, obesity, DM2, depression, arthritis, vitamin D deficiency, vascular dementia     Clinical Impressions Pt c/o weakness, no pain. Pt resting in bed, able to fully participate in therapy with frequent verbal cues for participation. Pt lives with husband, PLOF independent. Pt currently requires max A for bed mobility, LB ADLs, has R lateral lean with sitting/standing needing CGA-mod A to maintain balance. Able to stand with mod A using RW from elevated surface, R lateral lean but no overt LOB, able to take side steps at EOB. Pt would benefit from continued acute OT to maximize functional strength and participation in ADLs, DC to postacute rehab <3hrs/day recommended.      If plan is discharge home, recommend the following:   A lot of help with walking and/or transfers;A lot of help with bathing/dressing/bathroom;Assistance with cooking/housework;Assist for transportation;Help with stairs or ramp for entrance     Functional Status Assessment   Patient has had a recent decline in their functional status and demonstrates the ability to make significant improvements in function in a reasonable and predictable amount of time.     Equipment Recommendations   Other (comment) (defer)     Recommendations for Other  Services         Precautions/Restrictions   Precautions Precautions: Fall Recall of Precautions/Restrictions: Impaired Precaution/Restrictions Comments: multiple falls immediately prior to hospitalization Restrictions Weight Bearing Restrictions Per Provider Order: No     Mobility Bed Mobility Overal bed mobility: Needs Assistance Bed Mobility: Supine to Sit, Sit to Supine     Supine to sit: Max assist Sit to supine: Max assist   General bed mobility comments: max A in/out of bed for BLEs and torso    Transfers Overall transfer level: Needs assistance Equipment used: Rolling walker (2 wheels) Transfers: Sit to/from Stand, Bed to chair/wheelchair/BSC Sit to Stand: Mod assist, From elevated surface     Step pivot transfers: Mod assist     General transfer comment: mod A with RW, R lateral lean, no overt LOB      Balance Overall balance assessment: Needs assistance Sitting-balance support: Single extremity supported, Feet supported Sitting balance-Leahy Scale: Poor Sitting balance - Comments: R lateral lean CGA-mod A to maintain sitting balance Postural control: Right lateral lean Standing balance support: Bilateral upper extremity supported, During functional activity, Reliant on assistive device for balance Standing balance-Leahy Scale: Poor Standing balance comment: able to stand with mod A using RW, R lateral lean, no overt LOB                           ADL either performed or assessed with clinical judgement   ADL Overall ADL's : Needs assistance/impaired Eating/Feeding: NPO   Grooming: Minimal assistance;Cueing for sequencing;Sitting   Upper Body Bathing: Moderate assistance;Cueing for sequencing;Sitting  Lower Body Bathing: Maximal assistance;Cueing for sequencing;Sitting/lateral leans   Upper Body Dressing : Minimal assistance;Cueing for sequencing;Sitting   Lower Body Dressing: Maximal assistance;Cueing for sequencing;Sitting/lateral  leans;Sit to/from stand   Toilet Transfer: Moderate assistance;Stand-pivot;Rolling walker (2 wheels);BSC/3in1   Toileting- Clothing Manipulation and Hygiene: Maximal assistance;Cueing for sequencing;Sitting/lateral lean;Sit to/from stand         General ADL Comments: Pt with R lateral lean when sitting/standing, CGA to mod A to maintain balance. Pt able to stand with mod A using RW, able to take steps at bedside, mod a to maintain balance but no overt LOB. Pt able to don/doff socks with min A for balance and frequent verbal cues for continuation of task.     Vision         Perception         Praxis         Pertinent Vitals/Pain Pain Assessment Pain Assessment: No/denies pain     Extremity/Trunk Assessment Upper Extremity Assessment Upper Extremity Assessment: Generalized weakness;RUE deficits/detail;LUE deficits/detail RUE Deficits / Details: generalized weakness, B shoulder stiffness, AROM to ~90 degrees. Pt with weak grip, R stronger than L. Decreased FM skills likely due to decreased cognition and increased time for following commands. RUE Sensation: WNL RUE Coordination: decreased fine motor LUE Deficits / Details: generalized weakness, B shoulder stiffness, AROM to ~90 degrees. Pt with weak grip, R stronger than L. Decreased FM skills likely due to decreased cognition and increased time for following commands. LUE Sensation: WNL LUE Coordination: decreased fine motor           Communication Communication Communication: Impaired Factors Affecting Communication: Difficulty expressing self   Cognition Arousal: Alert Behavior During Therapy: Flat affect Cognition: Cognition impaired   Orientation impairments: Time         OT - Cognition Comments: hx of dementia, increased time and inconsistent with commands, not oriented to year or full situation                 Following commands: Impaired Following commands impaired: Follows one step commands with  increased time, Follows one step commands inconsistently     Cueing  General Comments   Cueing Techniques: Verbal cues;Gestural cues;Visual cues;Tactile cues      Exercises     Shoulder Instructions      Home Living Family/patient expects to be discharged to:: Private residence Living Arrangements: Spouse/significant other Available Help at Discharge: Family;Available 24 hours/day Type of Home: House Home Access: Stairs to enter [employer] of Steps: 3 Entrance Stairs-Rails: Can reach both Home Layout: Two level;Able to live on main level with bedroom/bathroom     Bathroom Shower/Tub: Chief Strategy Officer: Standard Bathroom Accessibility: No   Home Equipment: Grab bars - tub/shower;Hand held shower head   Additional Comments: Pt lives with husband who is available 24/7, bedroom on first floor      Prior Functioning/Environment Prior Level of Function : Independent/Modified Independent             Mobility Comments: ambulates community distances without AD, and drives ADLs Comments: independent with ADLs, laundry. husband cooks. pt reports managing her own meds    OT Problem List: Decreased strength;Decreased activity tolerance;Impaired vision/perception;Impaired balance (sitting and/or standing);Decreased cognition;Decreased safety awareness;Decreased knowledge of use of DME or AE;Decreased range of motion;Decreased coordination;Impaired UE functional use   OT Treatment/Interventions: Self-care/ADL training;Therapeutic exercise;Energy conservation;DME and/or AE instruction;Therapeutic activities;Patient/family education;Balance training      OT Goals(Current goals can be found in the care  plan section)   Acute Rehab OT Goals Patient Stated Goal: to improve functional strength OT Goal Formulation: With patient Time For Goal Achievement: 01/04/24 Potential to Achieve Goals: Good   OT Frequency:  Min 2X/week    Co-evaluation               AM-PAC OT 6 Clicks Daily Activity     Outcome Measure Help from another person eating meals?: A Little Help from another person taking care of personal grooming?: A Little Help from another person toileting, which includes using toliet, bedpan, or urinal?: A Lot Help from another person bathing (including washing, rinsing, drying)?: A Lot Help from another person to put on and taking off regular upper body clothing?: A Little Help from another person to put on and taking off regular lower body clothing?: A Lot 6 Click Score: 15   End of Session Equipment Utilized During Treatment: Gait belt;Rolling walker (2 wheels) Nurse Communication: Mobility status  Activity Tolerance: Patient tolerated treatment well Patient left: in bed;with call bell/phone within reach  OT Visit Diagnosis: Unsteadiness on feet (R26.81);Other abnormalities of gait and mobility (R26.89);Muscle weakness (generalized) (M62.81);Other symptoms and signs involving cognitive function;Repeated falls (R29.6)                Time: 8769-8741 OT Time Calculation (min): 28 min Charges:  OT General Charges $OT Visit: 1 Visit OT Evaluation $OT Re-eval: 1 Re-eval OT Treatments $Self Care/Home Management : 8-22 mins  Craig, OTR/L   Elouise JONELLE Bott 12/21/2023, 1:17 PM

## 2023-12-21 NOTE — Evaluation (Signed)
 Speech Language Pathology Evaluation Patient Details Name: Angela Morrison MRN: [id number] DOB: [birth year] Today's Date: 12/21/2023 Time: 9057-9046 SLP Time Calculation (min) (ACUTE ONLY): 11 min  Problem List:  Patient Active Problem List   Diagnosis Date Noted   Neck mass 12/10/2023   Acute encephalopathy 12/09/2023   Type 2 diabetes mellitus with complication, without long-term current use of insulin  (HCC) 05/20/2020   Class 3 severe obesity with serious comorbidity and body mass index (BMI) of 45.0 to 49.9 in adult [facility]) 06/10/2019   Snoring 06/10/2019   Excessive daytime sleepiness 06/10/2019   Hyperlipidemia 12/19/2017   Cholelithiasis and acute cholecystitis without obstruction 07/02/2017   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   Coronary atherosclerosis of native coronary artery 06/21/2013   Essential hypertension, benign 06/21/2013   Encounter for long-term (current) use of other medications 06/21/2013   Past Medical History:  Past Medical History:  Diagnosis Date   Anemia    few yrs ago none recent   Arthritis    Constipation    Coronary artery disease 2005   S/P PCI of RCA   Depression    Diabetes mellitus without complication (HCC)    type 2   Edema, lower extremity    History of MI (myocardial infarction)    Hyperlipidemia    Hypertension    Leg cramps    Lower back pain    Obesity    PONV (postoperative nausea and vomiting)    Shoulder pain    Thyroid  nodule    S/P bx 8/13 c/w goiter   Vitamin D deficiency    Weakness    Past Surgical History:  Past Surgical History:  Procedure Laterality Date   appendectomy Right    BACK SURGERY     lower back   CHOLECYSTECTOMY N/A 07/02/2017   Procedure: LAPAROSCOPIC CHOLECYSTECTOMY WITH INTRAOPERATIVE CHOLANGIOGRAM;  Surgeon: Mikell Katz, MD;  Location: [facility];  Service: General;  Laterality: N/A;   COLONOSCOPY WITH PROPOFOL  N/A 05/17/2016   Procedure: COLONOSCOPY WITH PROPOFOL ;  Surgeon:  Gladis MARLA Louder, MD;  Location: [facility];  Service: Endoscopy;  Laterality: N/A;   HPI:  73 year old female presenting to ED 11/01 with weakness and at least 4 falls in the last 24 hours, as well as inability to care for herself in the home. Labs notable for WBC 12.2. UA w/ pyuria and bacteruira. 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 Pt with decline in function with PT 11/12 with concern for possible extension. CT Head negative. Initially passed a swallow screen 11/2; SLP evaluations ordered 11/12. PMH: HTN, HLD, CAD, obesity, DM2, depression, arthritis, vitamin D deficiency, vascular dementia   Assessment / Plan / Recommendation Clinical Impression  Pt presents with cognitive and communicative deficits, although no family is currently present to assist in determining PLOF. Although communication was limited, language did appear to be fluent and speech was clear when she did respond. She will benefit from ongoing SLP f/u acutely and at next level of care as baseline is being determined.   Pt initially was very sleepy, haven been waken up by SLP. As the session progressed and alertness improved, she started to respond intermittently to  questions and commands. She was oriented to location but not situation. She initially stated her maiden name, but could select her married name when given options in a yes/no format. Accuracy overall with simple yes/no questions was ~70%. She would benefit from ongoing assessment of abilities during subsequent visits when more alert, and when family can provide more history.      SLP Assessment  SLP  Recommendation/Assessment: Patient needs continued Speech Language Pathology Services SLP Visit Diagnosis: Cognitive communication deficit (R41.841)     Assistance Recommended at Discharge  Frequent or constant Supervision/Assistance  Functional Status Assessment Patient has had a recent decline in their functional status and demonstrates the ability to make significant improvements in function in a reasonable and predictable amount of time.  Frequency and Duration min 2x/week  2 weeks      SLP Evaluation Cognition  Overall Cognitive Status: No family/caregiver present to determine baseline cognitive functioning Arousal/Alertness: Awake/alert Orientation Level: Oriented to place;Oriented to person;Disoriented to situation Attention: Sustained Sustained Attention: Impaired Sustained Attention Impairment: Verbal basic;Functional basic Memory: Impaired Memory Impairment: Decreased recall of new information Problem Solving: Impaired Problem Solving Impairment: Functional basic;Verbal basic       Comprehension  Auditory Comprehension Overall Auditory Comprehension: Impaired Yes/No Questions: Impaired Basic Biographical Questions: 76-100% accurate Basic Immediate Environment Questions: 50-74% accurate Commands: Impaired One Step Basic Commands: 25-49% accurate Conversation: Simple    Expression Expression Primary Mode of Expression: Verbal Verbal Expression Overall Verbal Expression: Appears within functional limits for tasks assessed   Oral / Motor  Oral Motor/Sensory Function Overall Oral Motor/Sensory Function:  (not following commands for direct assessment) Motor Speech Overall Motor Speech: Appears within functional limits for tasks assessed            Leita SAILOR., M.A. CCC-SLP Acute Rehabilitation Services Office: [phone]  Secure chat preferred  12/21/2023, 10:27 AM

## 2023-12-21 NOTE — Progress Notes (Signed)
 Echocardiogram 2D Echocardiogram has been performed.  Angela Morrison 12/21/2023, 2:43 PM

## 2023-12-21 NOTE — Progress Notes (Signed)
 STROKE TEAM PROGRESS NOTE   SUBJECTIVE (INTERVAL HISTORY) No family is at the bedside.  Overall her condition is stable.  Patient still has left facial droop and left leg weakness.  Not fully orientated likely baseline with dementia.  Poor historian.   OBJECTIVE Temp:  [97.3 F (36.3 C)-98.6 F (37 C)] 97.3 F (36.3 C) (11/13 1132) Pulse Rate:  [84-97] 84 (11/13 1132) Cardiac Rhythm: Normal sinus rhythm (11/13 0900) Resp:  [18] 18 (11/13 1132) BP: (121-145)/(76-86) 145/84 (11/13 1132) SpO2:  [97 %-100 %] 99 % (11/13 1132)  No results for input(s): GLUCAP in the last 168 hours. Recent Labs  Lab 12/15/23 0520 12/18/23 1004 12/20/23 0202  NA 138 138 140  K 4.2 4.4 4.5  CL 102 103 104  CO2 25 21* 24  GLUCOSE 94 130* 110*  BUN 12 28* 19  CREATININE 0.95 1.16* 0.90  CALCIUM  9.5 9.6 9.8  MG 1.6* 1.8 1.9  PHOS 2.5 3.1 2.5   No results for input(s): AST, ALT, ALKPHOS, BILITOT, PROT, ALBUMIN in the last 168 hours. Recent Labs  Lab 12/15/23 0520 12/18/23 1004 12/20/23 0202 12/21/23 1216  WBC 9.8 13.7* 14.2* 11.3*  HGB 12.6 12.1 12.3 12.1  HCT 38.2 36.9 36.9 36.5  MCV 89.0 88.5 89.3 89.2  PLT 229 261 269 259   Recent Labs  Lab 12/21/23 0104  CKTOTAL 83   No results for input(s): LABPROT, INR in the last 72 hours. Recent Labs    12/19/23 2345  COLORURINE YELLOW  LABSPEC 1.020  PHURINE 5.0  GLUCOSEU NEGATIVE  HGBUR NEGATIVE  BILIRUBINUR NEGATIVE  KETONESUR NEGATIVE  PROTEINUR NEGATIVE  NITRITE NEGATIVE  LEUKOCYTESUR LARGE*       Component Value Date/Time   CHOL 144 12/20/2023 1849   CHOL 160 04/13/2020 0900   TRIG 62 12/20/2023 1849   HDL 37 (L) 12/20/2023 1849   HDL 53 04/13/2020 0900   CHOLHDL 3.9 12/20/2023 1849   VLDL 12 12/20/2023 1849   LDLCALC 95 12/20/2023 1849   LDLCALC 94 04/13/2020 0900   Lab Results  Component Value Date   HGBA1C 5.8 (H) 12/20/2023   No results found for: LABOPIA, COCAINSCRNUR, LABBENZ,  AMPHETMU, THCU, LABBARB  No results for input(s): ETH in the last 168 hours.  I have personally reviewed the radiological images below and agree with the radiology interpretations.  VAS US  LOWER EXTREMITY VENOUS (DVT) Result Date: 12/21/2023  Lower Venous DVT Study Patient Name:  Angela Morrison  Date of Exam:   12/21/2023 Medical Rec #: [id number]          Accession #:    [id number] Date of Birth: February 15, 1950          Patient Gender: F Patient Age:   73 years Exam Location:  [facility] Procedure:      VAS US  LOWER EXTREMITY VENOUS (DVT) Referring Phys: ARY Chequita Mofield --------------------------------------------------------------------------------  Indications: Stroke.  Risk Factors: None identified. Limitations: Poor ultrasound/tissue interface and patient positioning. Comparison Study: No prior studies. Performing Technologist: Cordella Collet RVT  Examination Guidelines: A complete evaluation includes B-mode imaging, spectral Doppler, color Doppler, and power Doppler as needed of all accessible portions of each vessel. Bilateral testing is considered an integral part of a complete examination. Limited examinations for reoccurring indications may be performed as noted. The reflux portion of the exam is performed with the patient in reverse Trendelenburg.  +---------+---------------+---------+-----------+----------+--------------+ RIGHT    CompressibilityPhasicitySpontaneityPropertiesThrombus Aging +---------+---------------+---------+-----------+----------+--------------+ CFV      Full  Yes      Yes                                 +---------+---------------+---------+-----------+----------+--------------+ SFJ      Full                                                        +---------+---------------+---------+-----------+----------+--------------+ FV Prox  Full                                                         +---------+---------------+---------+-----------+----------+--------------+ FV Mid   Full                                                        +---------+---------------+---------+-----------+----------+--------------+ FV DistalFull                                                        +---------+---------------+---------+-----------+----------+--------------+ PFV      Full                                                        +---------+---------------+---------+-----------+----------+--------------+ POP      Full           Yes      Yes                                 +---------+---------------+---------+-----------+----------+--------------+ PTV      Full                                                        +---------+---------------+---------+-----------+----------+--------------+ PERO     Full                                                        +---------+---------------+---------+-----------+----------+--------------+   +---------+---------------+---------+-----------+----------+-------------------+ LEFT     CompressibilityPhasicitySpontaneityPropertiesThrombus Aging      +---------+---------------+---------+-----------+----------+-------------------+ CFV      Full           Yes      Yes                                      +---------+---------------+---------+-----------+----------+-------------------+  SFJ      Full                                                             +---------+---------------+---------+-----------+----------+-------------------+ FV Prox  Full                                                             +---------+---------------+---------+-----------+----------+-------------------+ FV Mid   Full                                                             +---------+---------------+---------+-----------+----------+-------------------+ FV Distal               Yes      Yes                                       +---------+---------------+---------+-----------+----------+-------------------+ PFV      Full                                                             +---------+---------------+---------+-----------+----------+-------------------+ POP      Full           Yes      Yes                                      +---------+---------------+---------+-----------+----------+-------------------+ PTV      Full                                                             +---------+---------------+---------+-----------+----------+-------------------+ PERO                                                  Not well visualized +---------+---------------+---------+-----------+----------+-------------------+     Summary: RIGHT: - There is no evidence of deep vein thrombosis in the lower extremity.  - No cystic structure found in the popliteal fossa.  LEFT: - There is no evidence of deep vein thrombosis in the lower extremity. However, portions of this examination were limited- see technologist comments above.  - No cystic structure found in the popliteal fossa.  *See table(s) above for measurements and observations. Electronically signed by Debby Robertson on 12/21/2023 at 12:10:03 PM.    Final  CT ANGIO HEAD NECK W WO CM Result Date: 12/21/2023 EXAM: CTA HEAD AND NECK WITHOUT AND WITH 12/21/2023 03:24:53 AM TECHNIQUE: CTA of the head and neck was performed without and with the administration of 75 mL of iohexol  (OMNIPAQUE ) 350 MG/ML injection. Multiplanar 2D and/or 3D reformatted images are provided for review. Automated exposure control, iterative reconstruction, and/or weight based adjustment of the mA/kV was utilized to reduce the radiation dose to as low as reasonably achievable. Stenosis of the internal carotid arteries measured using NASCET criteria. COMPARISON: MRI of the neck dated 12/10/2023. CLINICAL HISTORY: Neuro deficit, acute, stroke suspected. FINDINGS: CTA NECK: AORTIC  ARCH AND ARCH VESSELS: There is mild calcific plaque present within the aortic arch. No dissection or arterial injury. No significant stenosis of the brachiocephalic or subclavian arteries. CERVICAL CAROTID ARTERIES: Calcific plaque is present within the origins of the internal carotid arteries bilaterally, but there is no associated luminal stenosis. The cervical segments of the internal carotid arteries are tortuous, but normal in caliber. No dissection or arterial injury. No hemodynamically significant stenosis by NASCET criteria. CERVICAL VERTEBRAL ARTERIES: The vertebral arteries are codominant and normal in caliber. No dissection, arterial injury, or significant stenosis. LUNGS AND MEDIASTINUM: Unremarkable. SOFT TISSUES: A nodule is again demonstrated within the left lobe of the thyroid , which appears to measure greater than 2 cm. BONES: No acute abnormality. CTA HEAD: ANTERIOR CIRCULATION: There is mild-to-moderate calcific plaque within the carotid siphons and supraclinoid segments, with less than 20% luminal stenosis bilaterally. No significant stenosis of the anterior cerebral arteries. No significant stenosis of the middle cerebral arteries. No aneurysm. POSTERIOR CIRCULATION: There is mild-to-moderate calcific plaque present within the V4 segments bilaterally, but no flow-limiting stenosis. There is fetal type origin of the right posterior cerebral artery with a diminutive right P1 segment. No significant stenosis of the basilar artery. No aneurysm. OTHER: No dural venous sinus thrombosis on this non-dedicated study. IMPRESSION: 1. No large vessel occlusion or hemodynamically significant stenosis in the head or neck 2. Mild-to-moderate calcific plaque within the carotid siphons, supraclinoid segments, and V4 segments bilaterally, with less than 20% luminal stenosis in the carotid siphons and supraclinoid segments and no flow-limiting stenosis in the V4 segments 3. Fetal-type origin of the right  posterior cerebral artery with a diminutive right P1 segment 4. Incidental left thyroid  nodule measuring >2 cm, stable from 12/10/2023; patient age assumed 36 per guideline defaultrecommend non-emergent thyroid  ultrasound for further characterization as per ACR guidelines Electronically signed by: Evalene Coho MD 12/21/2023 04:46 AM EST RP Workstation: [id number]   MR LUMBAR SPINE WO CONTRAST Result Date: 12/21/2023 CLINICAL DATA:  Initial evaluation for acute myelopathy. EXAM: MRI LUMBAR SPINE WITHOUT CONTRAST TECHNIQUE: Multiplanar, multisequence MR imaging of the lumbar spine was performed. No intravenous contrast was administered. COMPARISON:  Prior MRI from 03/18/2009. FINDINGS: Segmentation: Standard. Lowest well-formed disc space labeled the L5-S1 level. Alignment: Trace 2 mm facet mediated anterolisthesis of L4 on L5. Alignment otherwise normal preservation of the normal lumbar lordosis. Vertebrae: Vertebral body height maintained without acute or chronic fracture. Bone marrow signal intensity within normal limits. Few small benign hemangiomata noted. No worrisome osseous lesions. No abnormal marrow edema. Conus medullaris and cauda equina: Conus extends to the L1-2 level. Conus and cauda equina appear normal. Paraspinal and other soft tissues: Paraspinous soft tissues within normal limits. Few small T2 hyperintense parapelvic cyst noted about the kidneys, benign in appearance, no follow-up imaging recommended. Disc levels: L1-2: Normal interspace. Mild bilateral facet spurring. No stenosis. L2-3: Minimal  disc bulge. Mild bilateral facet hypertrophy. No stenosis. L3-4: Mild diffuse disc bulge. Moderate bilateral facet hypertrophy. No significant spinal stenosis. Mild bilateral L3 foraminal narrowing. L4-5: Trace anterolisthesis. Disc desiccation with mild disc bulge. Moderate bilateral facet arthrosis. Resultant mild narrowing of the right lateral recess. Central canal remains adequately patent.  Mild right L4 foraminal stenosis. Left neural foramen remains patent. L5-S1: Advanced degenerative intervertebral disc space narrowing with disc desiccation and diffuse disc bulge. Reactive endplate change with marginal endplate osteophytic spurring. Sequelae remote left hemi laminectomy. Mild bile facet hypertrophy. No spinal stenosis. Mild to moderate bilateral L5 foraminal narrowing. IMPRESSION: 1. No acute abnormality within the lumbar spine. Normal MRI appearance of the conus and cauda equina. No high-grade spinal stenosis. 2. Advanced degenerative disc disease at L5-S1 with resultant mild to moderate bilateral L5 foraminal stenosis. 3. Disc bulge with facet hypertrophy at L4-5 with resultant mild right lateral recess and right L4 foraminal stenosis. 4. Disc bulge with facet hypertrophy at L3-4 with resultant mild bilateral L3 foraminal stenosis. Electronically Signed   By: Morene Hoard M.D.   On: 12/21/2023 02:47   MR THORACIC SPINE WO CONTRAST Result Date: 12/21/2023 CLINICAL DATA:  Initial evaluation for acute myelopathy. EXAM: MRI THORACIC SPINE WITHOUT CONTRAST TECHNIQUE: Multiplanar, multisequence MR imaging of the thoracic spine was performed. No intravenous contrast was administered. COMPARISON:  None available. FINDINGS: Alignment: Trace dextroscoliosis. Alignment otherwise normal preservation of the normal thoracic kyphosis. No listhesis. Vertebrae: Vertebral body height maintained without acute chronic fracture. Bone marrow signal intensity within normal limits. Few scattered benign hemangiomata noted. No worrisome osseous lesions or abnormal marrow edema. Cord:  Normal signal and morphology. Paraspinal and other soft tissues: Paraspinous soft tissues within normal limits. 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 This has been previously evaluated by thyroid  ultrasound and biopsy. Trace right pleural effusion noted. Disc levels: T10-11: Disc desiccation with mild disc  bulge. Mild bilateral facet hypertrophy. No spinal stenosis. Mild to moderate bilateral foraminal narrowing. Otherwise, no other significant disc pathology seen within the thoracic spine for age. No other significant facet disease. No spinal stenosis. Foramina remain otherwise patent. IMPRESSION: 1. Normal MRI appearance of the thoracic spinal cord. No cord signal changes to suggest myelopathy. 2. Mild degenerative disc bulging and facet hypertrophy at T10-11 with resultant mild to moderate bilateral foraminal stenosis. No significant spinal stenosis within the thoracic spine. 3. Trace right pleural effusion. Electronically Signed   By: Morene Hoard M.D.   On: 12/21/2023 02:41   CT HEAD WO CONTRAST ( ) Result Date: 12/20/2023 CLINICAL DATA:  Follow-up examination for stroke. EXAM: CT HEAD WITHOUT CONTRAST TECHNIQUE: Contiguous axial images were obtained from the base of the skull through the vertex without intravenous contrast. RADIATION DOSE REDUCTION: This exam was performed according to the departmental dose-optimization program which includes automated exposure control, adjustment of the mA and/or kV according to patient size and/or use of iterative reconstruction technique. COMPARISON:  Prior MRI from 12/10/2023. FINDINGS: Brain: Cerebral volume within normal limits. Severe chronic microvascular ischemic disease noted. Few scattered remote lacunar infarcts about the thalami noted. Chronic left cerebellar infarct. No acute intracranial hemorrhage. No visible acute large vessel territory infarct. Previously identified subcentimeter infarcts not visible by CT. No mass lesion or midline shift. No hydrocephalus or extra-axial fluid collection. Vascular: No abnormal hyperdense vessel. Calcified atherosclerosis present at skull base. Skull: Scalp soft tissues and calvarium demonstrate no new finding. Sinuses/Orbits: Globes orbital soft tissues within normal limits. Mild scattered mucosal thickening about  the sphenoid ethmoidal sinuses. No mastoid effusion. Other: None. IMPRESSION: 1. No acute intracranial abnormality. 2. Severe chronic microvascular ischemic disease with a few scattered remote lacunar infarcts about the thalami and left cerebellum. Recently identified subcentimeter infarcts not visible by CT. Electronically Signed   By: Morene Hoard M.D.   On: 12/20/2023 20:06   DG CHEST PORT 1 VIEW Result Date: 12/19/2023 EXAM: 1 VIEW(S) XRAY OF THE CHEST 12/19/2023 09:00:00 AM COMPARISON: Comparison radiographs 12/09/2023. CLINICAL HISTORY: Aspiration pneumonia. FINDINGS: LUNGS AND PLEURA: Low lung volumes. The patient is rotated to the right. Minimal streaky right greater than left basilar opacities could reflect bronchovascular crowding, atelectasis, or developing infiltrate. No pulmonary edema. No pleural effusion. No pneumothorax. HEART AND MEDIASTINUM: No acute abnormality of the cardiac and mediastinal silhouettes. BONES AND SOFT TISSUES: No acute osseous abnormality. IMPRESSION: 1. Low lung volumes. Minimal streaky right greater than left basilar opacities could reflect bronchovascular crowding, atelectasis, or developing infiltrate. Electronically signed by: Harrietta Sherry MD 12/19/2023 01:43 PM EST RP Workstation: [id number]   MR Neck Soft Tissue Only W or Wo Contrast Result Date: 12/10/2023 EXAM: MR NECK WITH AND WITHOUT INTRAVENOUS CONTRAST 12/10/2023 10:17:00 AM TECHNIQUE: Multiplanar multisequence MRI of the neck was performed with and without the administration of 9 mL gadobutrol (GADAVIST) 1 MMOL/ML injection. COMPARISON: CT cervical spine 12/09/2023. CLINICAL HISTORY: Neck mass, nonpulsatile. FINDINGS: LIMITATIONS: The examination is mildly to moderately motion degraded. PHARYNX AND LARYNX: As shown on today's CT, there is a mass deep to the hyoid bone which measures 3.1 x 1.8 x 1.9 cm. The mass demonstrates uniform mild to moderate T2 hyperintensity, intrinsic T1 hyperintensity,  and no convincing enhancement. The mass extends across the midline bilaterally but is eccentric to the left with mild mass effect on the supraglottic larynx. The midline component of the mass is intimately associated with the hyoid bone and extends between the hyoid bone and thyroid  cartilage. No associated bone or cartilage destruction is evident. No mucosal mass is identified. There is no parapharyngeal or retropharyngeal fluid collection or inflammation. SALIVARY GLANDS: The parotid and submandibular glands appear unremarkable. THYROID : Bilateral thyroid  nodules including a dominant left lower pole nodule measuring at least 2 cm in diameter, previously evaluated by ultrasound. LYMPH NODES: No cervical or supraclavicular lymphadenopathy is seen. SOFT TISSUES: No appreciable soft tissue swelling or other mass is seen. BRAIN, ORBITS AND SINUSES: Brain reported separately. The visualized portion of the orbits demonstrate no acute abnormality. Mild right ethmoid sinus mucosal thickening. Clear mastoid air cells. BONES: Mild cervical spondylosis. No suspicious marrow lesion. IMPRESSION: 1. 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 Electronically signed by: Dasie Hamburg MD 12/10/2023 10:44 AM EST RP Workstation: [id number]   MR BRAIN W WO CONTRAST Result Date: 12/10/2023 EXAM: MRI BRAIN WITH AND WITHOUT CONTRAST 12/10/2023 10:16:57 AM TECHNIQUE: Multiplanar multisequence MRI of the head/brain was performed with and without the administration of 9 mL gadobutrol (GADAVIST) 1 MMOL/ML injection. COMPARISON: Head CT 12/20/2023 and MRI 07/19/2020. CLINICAL HISTORY: Neuro deficit, acute, stroke suspected. Multiple recent falls. FINDINGS: The study is mildly motion degraded. BRAIN AND VENTRICLES: There is a subcentimeter acute infarct in the posterior limb of the right internal capsule. A subcentimeter focus of restricted diffusion and associated enhancement in the left external capsule  likely reflects a subacute infarct. There is also a punctate focus of mild trace diffusion weighted signal hyperintensity with normal ADC in the left occipital white matter which may also reflect a subacute  infarct. Chronic microhemorrhages are present in the left thalamus and cerebellum. Confluent T2 hyperintensities in the cerebral white matter bilaterally have progressed from the prior MRI and are nonspecific but compatible with severe chronic small vessel ischemic disease. Chronic lacunar infarcts are again seen in the basal ganglia, thalami, pons, and cerebellum. There is mild cerebral atrophy. A normal variant cavum septum pellucidum et verge is noted. No mass, midline shift, hydrocephalus, or extra axial fluid collection is identified. Major intracranial vascular flow voids are preserved. ORBITS: No acute abnormality. SINUSES: Moderate right ethmoid sinus mucosal thickening. Clear mastoid air cells. BONES AND SOFT TISSUES: Normal bone marrow signal and enhancement. Neck reported separately. IMPRESSION: 1. 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 2. Likely subacute infarcts in the left external capsule and left occipital white matter. 3. Severe chronic small vessel ischemic disease. Electronically signed by: Dasie Hamburg MD 12/10/2023 10:30 AM EST RP Workstation: [id number]   CT Head Wo Contrast Result Date: 12/09/2023 EXAM: CT HEAD WITHOUT CONTRAST 12/09/2023 04:47:28 PM TECHNIQUE: CT of the head was performed without the administration of intravenous contrast. Automated exposure control, iterative reconstruction, and/or weight based adjustment of the mA/kV was utilized to reduce the radiation dose to as low as reasonably achievable. COMPARISON: None available. CLINICAL HISTORY: Head trauma, minor (Age >= 65y). FINDINGS: BRAIN AND VENTRICLES: No acute hemorrhage. No evidence of acute infarct. 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 Left cerebellar infarct.  Extensive periventricular and subcortical white matter low-density changes compatible with chronic microvascular ischemic change. Mild diffuse cerebral volume loss. Cavum septum pellucidum et vergae. No hydrocephalus. No extra-axial collection. No mass effect or midline shift. ORBITS: No acute abnormality. SINUSES: No acute abnormality. SOFT TISSUES AND SKULL: No acute soft tissue abnormality. No skull fracture. Atherosclerotic calcifications in large vessels of skull base. IMPRESSION: 1. No acute intracranial abnormality. 2. 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 3. Extensive periventricular and subcortical white matter changes compatible with chronic microvascular ischemic change. Electronically signed by: Franky Stanford MD 12/09/2023 05:01 PM EDT RP Workstation: [id number]   CT Cervical Spine Wo Contrast Result Date: 12/09/2023 EXAM: CT CERVICAL SPINE WITHOUT CONTRAST 12/09/2023 04:47:28 PM TECHNIQUE: CT of the cervical spine was performed without the administration of intravenous contrast. Multiplanar reformatted images are provided for review. Automated exposure control, iterative reconstruction, and/or weight based adjustment of the mA/kV was utilized to reduce the radiation dose to as low as reasonably achievable. COMPARISON: None available. CLINICAL HISTORY: Neck trauma (Age >= 65y) FINDINGS: CERVICAL SPINE: BONES AND ALIGNMENT: No acute fracture or traumatic malalignment. DEGENERATIVE CHANGES: No significant degenerative changes. 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 This causes mild mass effect on the airway, but the airway remains widely patent. Non-erupted central maxillary incisors. Edentulous oral cavity. IMPRESSION: 1. Ovoid mass deep to the hyoid bone at the level of the glottic folds measuring 2.3 x 1.4 x 2.1 cm, causing mild mass effect on the airway, which remains  widely patent. MRI of the neck with and without contrast recommended for further evaluation. 2. No acute abnormality of the cervical spine. Electronically signed by: Franky Stanford MD 12/09/2023 05:00 PM EDT RP Workstation: [id number]   DG Chest Portable 1 View Result Date: 12/09/2023 CLINICAL DATA:  Weakness, multiple falls. EXAM: PORTABLE CHEST 1 VIEW, PORTABLE PELVIS COMPARISON:  08/16/2011. FINDINGS: Chest: The heart size and  mediastinal contours are within normal limits. Lung volumes are low. No consolidation, effusion, or pneumothorax is seen. Surgical clips are present in the right upper quadrant. No acute osseous abnormality. Pelvis: There is no evidence of acute fracture or dislocation. Mild degenerative changes are noted at the hips bilaterally. Vascular calcifications are seen in the pelvis and bilateral lower extremities. IMPRESSION: 1. No active disease. 2. No acute fracture or dislocation at the pelvis. Electronically Signed   By: Leita Birmingham M.D.   On: 12/09/2023 16:32   DG Pelvis Portable Result Date: 12/09/2023 CLINICAL DATA:  Weakness, multiple falls. EXAM: PORTABLE CHEST 1 VIEW, PORTABLE PELVIS COMPARISON:  08/16/2011. FINDINGS: Chest: The heart size and mediastinal contours are within normal limits. Lung volumes are low. No consolidation, effusion, or pneumothorax is seen. Surgical clips are present in the right upper quadrant. No acute osseous abnormality. Pelvis: There is no evidence of acute fracture or dislocation. Mild degenerative changes are noted at the hips bilaterally. Vascular calcifications are seen in the pelvis and bilateral lower extremities. IMPRESSION: 1. No active disease. 2. No acute fracture or dislocation at the pelvis. Electronically Signed   By: Leita Birmingham M.D.   On: 12/09/2023 16:32     PHYSICAL EXAM  Temp:  [97.3 F (36.3 C)-98.6 F (37 C)] 97.3 F (36.3 C) (11/13 1132) Pulse Rate:  [84-97] 84 (11/13 1132) Resp:  [18] 18 (11/13 1132) BP:  (121-145)/(76-86) 145/84 (11/13 1132) SpO2:  [97 %-100 %] 99 % (11/13 1132)  General - Well nourished, well developed, in no apparent distress.  Ophthalmologic - fundi not visualized due to noncooperation.  Cardiovascular - Regular rhythm and rate.  Neuro - awake, alert, eyes open, orientated to hospital and month, but not to age or year. Limited language output with only words, able to follow some simple commands. Able to name 2/3 and repeat simple sentences. No gaze palsy, tracking bilaterally, visual field full. Mild left facial droop. Tongue midline. Bilateral UEs at least 3/5 but not holding constantly against gravity as requested. RLE 3+/5 and LLE 2+/5. Sensation exam not corporative, b/l FTN not corporative, gait not tested.     ASSESSMENT/PLAN Ms. Angela Morrison is a 73 y.o. female with history of hypertension, hyperlipidemia, CAD, COPD, dementia admitted for confusion and left lower extremity weakness. No TNK given due to outside window.    Stroke:  bilateral BG and left temporal WM small infarcts, embolic vs. synchronized small vessel disease  CT head no acute finding.  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 CT head and neck right fetal PCA, atherosclerosis bilateral carotid siphon and bilateral V4 segments. MRI acute subcentimeter infarct in the posterior limb of the right internal capsule.  Subacute infarcts in the left external capsule and left occipital white matter. Limited MRI repeat pending 2D Echo EF 55 to 60% LE venous Doppler no DVT Recommend 30-day CardioNet monitoring as outpatient to rule out A-fib LDL 95 HgbA1c 5.8 Lovenox  for VTE prophylaxis No antithrombotic prior to admission, now on aspirin  81 mg daily and clopidogrel 75 mg daily DAPT for 3 weeks and then aspirin  alone. Ongoing aggressive stroke risk factor management Therapy recommendations:  SNF Disposition: Pending  Hypertension Stable Avoid low BP Long term BP goal  normotensive  Hyperlipidemia Home meds: Lipitor 80 and Zetia  10 LDL 95, goal < 70 Now on Lipitor 80 and Zetia  10 Continue statin at discharge  Other Stroke Risk Factors Advanced age CAD Obesity, Body mass index is 35.19 kg/m.   Other Active  Problems COPD Baseline dementia  Hospital day # 11    Ary Cummins, MD PhD Stroke Neurology 12/21/2023 1:08 PM    To contact Stroke Continuity provider, please refer to [email or web address]. After hours, contact General Neurology

## 2023-12-22 ENCOUNTER — Inpatient Hospital Stay (HOSPITAL_COMMUNITY)

## 2023-12-22 DIAGNOSIS — I639 Cerebral infarction, unspecified: Secondary | ICD-10-CM | POA: Diagnosis not present

## 2023-12-22 DIAGNOSIS — R4182 Altered mental status, unspecified: Secondary | ICD-10-CM | POA: Diagnosis not present

## 2023-12-22 DIAGNOSIS — R296 Repeated falls: Secondary | ICD-10-CM | POA: Diagnosis not present

## 2023-12-22 DIAGNOSIS — G9340 Encephalopathy, unspecified: Secondary | ICD-10-CM | POA: Diagnosis not present

## 2023-12-22 LAB — COMPREHENSIVE METABOLIC PANEL WITH GFR
ALT: 68 U/L — ABNORMAL HIGH (ref 0–44)
AST: 43 U/L — ABNORMAL HIGH (ref 15–41)
Albumin: 2.4 g/dL — ABNORMAL LOW (ref 3.5–5.0)
Alkaline Phosphatase: 92 U/L (ref 38–126)
Anion gap: 10 (ref 5–15)
BUN: 22 mg/dL (ref 8–23)
CO2: 25 mmol/L (ref 22–32)
Calcium: 9.7 mg/dL (ref 8.9–10.3)
Chloride: 101 mmol/L (ref 98–111)
Creatinine, Ser: 1.15 mg/dL — ABNORMAL HIGH (ref 0.44–1.00)
GFR, Estimated: 50 mL/min — ABNORMAL LOW (ref >60)
Glucose, Bld: 129 mg/dL — ABNORMAL HIGH (ref 70–99)
Potassium: 4.1 mmol/L (ref 3.5–5.1)
Sodium: 136 mmol/L (ref 135–145)
Total Bilirubin: 0.9 mg/dL (ref 0.0–1.2)
Total Protein: 6.3 g/dL — ABNORMAL LOW (ref 6.5–8.1)

## 2023-12-22 LAB — CBC
HCT: 35 % — ABNORMAL LOW (ref 36.0–46.0)
Hemoglobin: 11.5 g/dL — ABNORMAL LOW (ref 12.0–15.0)
MCH: 29.3 pg (ref 26.0–34.0)
MCHC: 32.9 g/dL (ref 30.0–36.0)
MCV: 89.1 fL (ref 80.0–100.0)
Platelets: 293 K/uL (ref 150–400)
RBC: 3.93 MIL/uL (ref 3.87–5.11)
RDW: 14.6 % (ref 11.5–15.5)
WBC: 11.7 K/uL — ABNORMAL HIGH (ref 4.0–10.5)
nRBC: 0 % (ref 0.0–0.2)

## 2023-12-22 NOTE — Progress Notes (Signed)
 STROKE TEAM PROGRESS NOTE   SUBJECTIVE (INTERVAL HISTORY) No family is at the bedside.  Pt is reclining in bed for icecream. She is able to feed herself icecream without difficulty. No ataxia or weakness observed. MRI repeat overnight showed additional bilateral small punctate infarcts, continue to suggest cardioembolic source. I talked with husband and he agreed with loop recorder, but by the time cardiac team talked with pt about loop and pt refused. Will do 30 day monitoring    OBJECTIVE Temp:  [97.9 F (36.6 C)-98.7 F (37.1 C)] 97.9 F (36.6 C) (11/14 0929) Pulse Rate:  [80-93] 86 (11/14 0929) Cardiac Rhythm: Normal sinus rhythm (11/14 0715) Resp:  [19] 19 (11/14 0929) BP: (107-155)/(55-81) 127/81 (11/14 0929) SpO2:  [100 %] 100 % (11/14 0929) Weight:  [90.5 kg] 90.5 kg (11/14 0500)  No results for input(s): GLUCAP in the last 168 hours. Recent Labs  Lab 12/18/23 1004 12/20/23 0202 12/21/23 1216 12/22/23 0440  NA 138 140 137 136  K 4.4 4.5 4.1 4.1  CL 103 104 103 101  CO2 21* 24 24 25   GLUCOSE 130* 110* 109* 129*  BUN 28* 19 17 22   CREATININE 1.16* 0.90 0.94 1.15*  CALCIUM  9.6 9.8 9.7 9.7  MG 1.8 1.9  --   --   PHOS 3.1 2.5  --   --    Recent Labs  Lab 12/21/23 1216 12/22/23 0440  AST 42* 43*  ALT 64* 68*  ALKPHOS 97 92  BILITOT 1.2 0.9  PROT 7.0 6.3*  ALBUMIN 2.6* 2.4*   Recent Labs  Lab 12/18/23 1004 12/20/23 0202 12/21/23 1216 12/22/23 0440  WBC 13.7* 14.2* 11.3* 11.7*  HGB 12.1 12.3 12.1 11.5*  HCT 36.9 36.9 36.5 35.0*  MCV 88.5 89.3 89.2 89.1  PLT 261 269 259 293   Recent Labs  Lab 12/21/23 0104  CKTOTAL 83   No results for input(s): LABPROT, INR in the last 72 hours. Recent Labs    12/19/23 2345  COLORURINE YELLOW  LABSPEC 1.020  PHURINE 5.0  GLUCOSEU NEGATIVE  HGBUR NEGATIVE  BILIRUBINUR NEGATIVE  KETONESUR NEGATIVE  PROTEINUR NEGATIVE  NITRITE NEGATIVE  LEUKOCYTESUR LARGE*       Component Value Date/Time   CHOL 144  12/20/2023 1849   CHOL 160 04/13/2020 0900   TRIG 62 12/20/2023 1849   HDL 37 (L) 12/20/2023 1849   HDL 53 04/13/2020 0900   CHOLHDL 3.9 12/20/2023 1849   VLDL 12 12/20/2023 1849   LDLCALC 95 12/20/2023 1849   LDLCALC 94 04/13/2020 0900   Lab Results  Component Value Date   HGBA1C 5.8 (H) 12/20/2023   No results found for: LABOPIA, COCAINSCRNUR, LABBENZ, AMPHETMU, THCU, LABBARB  No results for input(s): ETH in the last 168 hours.  I have personally reviewed the radiological images below and agree with the radiology interpretations.  MR BRAIN WO CONTRAST Result Date: 12/22/2023 CLINICAL DATA:  Follow-up examination for stroke. EXAM: MRI HEAD WITHOUT CONTRAST TECHNIQUE: Multiplanar, multiecho pulse sequences of the brain and surrounding structures were obtained without intravenous contrast. COMPARISON:  Prior MRI from 12/10/2023. FINDINGS: Brain: A limited MRI consisting of diffusion weighted imaging and SWI sequence only were performed. There has been interval evolution of previously identified small ischemic infarcts, now subacute and/or resolved in appearance. However, there are a few scattered new subcentimeter foci of diffusion signal abnormality seen involving both cerebral hemispheres (series 5, images 85, 76, 74, 72, 71). Additional subcentimeter focus now seen at the left cerebellum (series 5, image  63). These are new from prior, and are consistent with small acute to early subacute ischemic infarcts. No associated hemorrhage or mass effect. Otherwise, gray-white matter differentiation grossly maintained. No acute intracranial hemorrhage. Few scattered chronic micro hemorrhages noted, likely hypertensive in nature. No mass lesion or midline shift. No hydrocephalus or extra-axial fluid collection. Vascular: Not well assessed on this limited exam. Skull and upper cervical spine: Not well assessed on this limited exam. Sinuses/Orbits: Not well assessed on this limited exam.  Other: None. IMPRESSION: 1. Few scattered subcentimeter acute to early subacute ischemic infarcts involving both cerebral hemispheres and left cerebellum, new as compared to prior MRI from 12/10/2023. No associated hemorrhage or mass effect. 2. Interval evolution of previously identified small ischemic infarcts, now subacute and/or resolved in appearance. Electronically Signed   By: Morene Hoard M.D.   On: 12/22/2023 00:38   ECHOCARDIOGRAM COMPLETE Result Date: 12/21/2023    ECHOCARDIOGRAM REPORT   Patient Name:   Angela Morrison Date of Exam: 12/21/2023 Medical Rec #:  [id number]         Height:       64.0 in Accession #:    [id number]        Weight:       205.0 lb Date of Birth:  03-01-1950         BSA:          1.977 m Patient Age:    73 years          BP:           121/86 mmHg Patient Gender: F                 HR:           79 bpm. Exam Location:  Inpatient Procedure: 2D Echo, Cardiac Doppler and Color Doppler (Both Spectral and Color            Flow Doppler were utilized during procedure). Indications:    Stroke  History:        Patient has no prior history of Echocardiogram examinations. CAD                 and Previous Myocardial Infarction, Stroke,                 Signs/Symptoms:Edema; Risk Factors:Hypertension, Dyslipidemia                 and Diabetes.  Sonographer:    Juliene Rucks Referring Phys: [phone] STEPHEN K CHIU  Sonographer Comments: Suboptimal subcostal window and patient is obese. Image acquisition challenging due to patient body habitus. IMPRESSIONS  1. Left ventricular ejection fraction, by estimation, is 55 to 60%. The left ventricle has normal function. The left ventricle has no regional wall motion abnormalities. There is mild concentric left ventricular hypertrophy. Left ventricular diastolic parameters are consistent with Grade I diastolic dysfunction (impaired relaxation).  2. Right ventricular systolic function is normal. The right ventricular size is normal. Tricuspid  regurgitation signal is inadequate for assessing PA pressure.  3. The mitral valve is normal in structure. No evidence of mitral valve regurgitation. No evidence of mitral stenosis.  4. The aortic valve is normal in structure. Aortic valve regurgitation is not visualized. No aortic stenosis is present.  5. The inferior vena cava is normal in size with greater than 50% respiratory variability, suggesting right atrial pressure of 3 mmHg. FINDINGS  Left Ventricle: Left ventricular ejection fraction, by estimation, is 55 to 60%. The left ventricle  has normal function. The left ventricle has no regional wall motion abnormalities. The left ventricular internal cavity size was normal in size. There is  mild concentric left ventricular hypertrophy. Left ventricular diastolic parameters are consistent with Grade I diastolic dysfunction (impaired relaxation). Right Ventricle: The right ventricular size is normal. No increase in right ventricular wall thickness. Right ventricular systolic function is normal. Tricuspid regurgitation signal is inadequate for assessing PA pressure. Left Atrium: Left atrial size was normal in size. Right Atrium: Right atrial size was normal in size. Pericardium: There is no evidence of pericardial effusion. Mitral Valve: The mitral valve is normal in structure. No evidence of mitral valve regurgitation. No evidence of mitral valve stenosis. MV peak gradient, 5.1 mmHg. The mean mitral valve gradient is 1.0 mmHg. Tricuspid Valve: The tricuspid valve is normal in structure. Tricuspid valve regurgitation is not demonstrated. No evidence of tricuspid stenosis. Aortic Valve: The aortic valve is normal in structure. Aortic valve regurgitation is not visualized. No aortic stenosis is present. Pulmonic Valve: The pulmonic valve was normal in structure. Pulmonic valve regurgitation is not visualized. No evidence of pulmonic stenosis. Aorta: The aortic root is normal in size and structure. Venous: The  inferior vena cava is normal in size with greater than 50% respiratory variability, suggesting right atrial pressure of 3 mmHg. IAS/Shunts: No atrial level shunt detected by color flow Doppler.  LEFT VENTRICLE PLAX 2D LVIDd:         3.70 cm   Diastology LVIDs:         2.60 cm   LV e' medial:    4.57 cm/s LV PW:         1.00 cm   LV E/e' medial:  16.5 LV IVS:        1.10 cm   LV e' lateral:   7.07 cm/s LVOT diam:     2.00 cm   LV E/e' lateral: 10.7 LV SV:         59 LV SV Index:   30 LVOT Area:     3.14 cm  RIGHT VENTRICLE RV S prime:     13.20 cm/s TAPSE (M-mode): 1.9 cm LEFT ATRIUM           Index        RIGHT ATRIUM           Index LA diam:      2.40 cm 1.21 cm/m   RA Area:     12.90 cm LA Vol (A2C): 17.2 ml 8.70 ml/m   RA Volume:   28.80 ml  14.57 ml/m LA Vol (A4C): 45.3 ml 22.91 ml/m  AORTIC VALVE LVOT Vmax:   95.00 cm/s LVOT Vmean:  59.200 cm/s LVOT VTI:    0.189 m  AORTA Ao Root diam: 2.60 cm Ao Asc diam:  2.80 cm MITRAL VALVE MV Area (PHT): 3.31 cm     SHUNTS MV Area VTI:   1.65 cm     Systemic VTI:  0.19 m MV Peak grad:  5.1 mmHg     Systemic Diam: 2.00 cm MV Mean grad:  1.0 mmHg MV Vmax:       1.13 m/s MV Vmean:      55.7 cm/s MV Decel Time: 229 msec MV E velocity: 75.50 cm/s MV A velocity: 121.00 cm/s MV E/A ratio:  0.62 Kardie Tobb DO Electronically signed by Dub Huntsman DO Signature Date/Time: 12/21/2023/3:31:39 PM    Final    VAS US  LOWER EXTREMITY VENOUS (DVT) Result Date: 12/21/2023  Lower Venous DVT Study Patient Name:  VERNEDA HOLLOPETER  Date of Exam:   12/21/2023 Medical Rec #: [id number]          Accession #:    [id number] Date of Birth: 18-Jan-1951          Patient Gender: F Patient Age:   39 years Exam Location:  [facility] Procedure:      VAS US  LOWER EXTREMITY VENOUS (DVT) Referring Phys: ARY Makari Sanko --------------------------------------------------------------------------------  Indications: Stroke.  Risk Factors: None identified. Limitations: Poor ultrasound/tissue  interface and patient positioning. Comparison Study: No prior studies. Performing Technologist: Cordella Collet RVT  Examination Guidelines: A complete evaluation includes B-mode imaging, spectral Doppler, color Doppler, and power Doppler as needed of all accessible portions of each vessel. Bilateral testing is considered an integral part of a complete examination. Limited examinations for reoccurring indications may be performed as noted. The reflux portion of the exam is performed with the patient in reverse Trendelenburg.  +---------+---------------+---------+-----------+----------+--------------+ RIGHT    CompressibilityPhasicitySpontaneityPropertiesThrombus Aging +---------+---------------+---------+-----------+----------+--------------+ CFV      Full           Yes      Yes                                 +---------+---------------+---------+-----------+----------+--------------+ SFJ      Full                                                        +---------+---------------+---------+-----------+----------+--------------+ FV Prox  Full                                                        +---------+---------------+---------+-----------+----------+--------------+ FV Mid   Full                                                        +---------+---------------+---------+-----------+----------+--------------+ FV DistalFull                                                        +---------+---------------+---------+-----------+----------+--------------+ PFV      Full                                                        +---------+---------------+---------+-----------+----------+--------------+ POP      Full           Yes      Yes                                 +---------+---------------+---------+-----------+----------+--------------+ PTV  Full                                                         +---------+---------------+---------+-----------+----------+--------------+ PERO     Full                                                        +---------+---------------+---------+-----------+----------+--------------+   +---------+---------------+---------+-----------+----------+-------------------+ LEFT     CompressibilityPhasicitySpontaneityPropertiesThrombus Aging      +---------+---------------+---------+-----------+----------+-------------------+ CFV      Full           Yes      Yes                                      +---------+---------------+---------+-----------+----------+-------------------+ SFJ      Full                                                             +---------+---------------+---------+-----------+----------+-------------------+ FV Prox  Full                                                             +---------+---------------+---------+-----------+----------+-------------------+ FV Mid   Full                                                             +---------+---------------+---------+-----------+----------+-------------------+ FV Distal               Yes      Yes                                      +---------+---------------+---------+-----------+----------+-------------------+ PFV      Full                                                             +---------+---------------+---------+-----------+----------+-------------------+ POP      Full           Yes      Yes                                      +---------+---------------+---------+-----------+----------+-------------------+ PTV      Full                                                             +---------+---------------+---------+-----------+----------+-------------------+  PERO                                                  Not well visualized +---------+---------------+---------+-----------+----------+-------------------+     Summary:  RIGHT: - There is no evidence of deep vein thrombosis in the lower extremity.  - No cystic structure found in the popliteal fossa.  LEFT: - There is no evidence of deep vein thrombosis in the lower extremity. However, portions of this examination were limited- see technologist comments above.  - No cystic structure found in the popliteal fossa.  *See table(s) above for measurements and observations. Electronically signed by Debby Robertson on 12/21/2023 at 12:10:03 PM.    Final    CT ANGIO HEAD NECK W WO CM Result Date: 12/21/2023 EXAM: CTA HEAD AND NECK WITHOUT AND WITH 12/21/2023 03:24:53 AM TECHNIQUE: CTA of the head and neck was performed without and with the administration of 75 mL of iohexol  (OMNIPAQUE ) 350 MG/ML injection. Multiplanar 2D and/or 3D reformatted images are provided for review. Automated exposure control, iterative reconstruction, and/or weight based adjustment of the mA/kV was utilized to reduce the radiation dose to as low as reasonably achievable. Stenosis of the internal carotid arteries measured using NASCET criteria. COMPARISON: MRI of the neck dated 12/10/2023. CLINICAL HISTORY: Neuro deficit, acute, stroke suspected. FINDINGS: CTA NECK: AORTIC ARCH AND ARCH VESSELS: There is mild calcific plaque present within the aortic arch. No dissection or arterial injury. No significant stenosis of the brachiocephalic or subclavian arteries. CERVICAL CAROTID ARTERIES: Calcific plaque is present within the origins of the internal carotid arteries bilaterally, but there is no associated luminal stenosis. The cervical segments of the internal carotid arteries are tortuous, but normal in caliber. No dissection or arterial injury. No hemodynamically significant stenosis by NASCET criteria. CERVICAL VERTEBRAL ARTERIES: The vertebral arteries are codominant and normal in caliber. No dissection, arterial injury, or significant stenosis. LUNGS AND MEDIASTINUM: Unremarkable. SOFT TISSUES: A nodule is again  demonstrated within the left lobe of the thyroid , which appears to measure greater than 2 cm. BONES: No acute abnormality. CTA HEAD: ANTERIOR CIRCULATION: There is mild-to-moderate calcific plaque within the carotid siphons and supraclinoid segments, with less than 20% luminal stenosis bilaterally. No significant stenosis of the anterior cerebral arteries. No significant stenosis of the middle cerebral arteries. No aneurysm. POSTERIOR CIRCULATION: There is mild-to-moderate calcific plaque present within the V4 segments bilaterally, but no flow-limiting stenosis. There is fetal type origin of the right posterior cerebral artery with a diminutive right P1 segment. No significant stenosis of the basilar artery. No aneurysm. OTHER: No dural venous sinus thrombosis on this non-dedicated study. IMPRESSION: 1. No large vessel occlusion or hemodynamically significant stenosis in the head or neck 2. Mild-to-moderate calcific plaque within the carotid siphons, supraclinoid segments, and V4 segments bilaterally, with less than 20% luminal stenosis in the carotid siphons and supraclinoid segments and no flow-limiting stenosis in the V4 segments 3. Fetal-type origin of the right posterior cerebral artery with a diminutive right P1 segment 4. Incidental left thyroid  nodule measuring >2 cm, stable from 12/10/2023; patient age assumed 33 per guideline defaultrecommend non-emergent thyroid  ultrasound for further characterization as per ACR guidelines Electronically signed by: Evalene Coho MD 12/21/2023 04:46 AM EST RP Workstation: [id number]   MR LUMBAR SPINE WO CONTRAST Result Date: 12/21/2023 CLINICAL DATA:  Initial evaluation for acute myelopathy. EXAM: MRI LUMBAR  SPINE WITHOUT CONTRAST TECHNIQUE: Multiplanar, multisequence MR imaging of the lumbar spine was performed. No intravenous contrast was administered. COMPARISON:  Prior MRI from 03/18/2009. FINDINGS: Segmentation: Standard. Lowest well-formed disc space  labeled the L5-S1 level. Alignment: Trace 2 mm facet mediated anterolisthesis of L4 on L5. Alignment otherwise normal preservation of the normal lumbar lordosis. Vertebrae: Vertebral body height maintained without acute or chronic fracture. Bone marrow signal intensity within normal limits. Few small benign hemangiomata noted. No worrisome osseous lesions. No abnormal marrow edema. Conus medullaris and cauda equina: Conus extends to the L1-2 level. Conus and cauda equina appear normal. Paraspinal and other soft tissues: Paraspinous soft tissues within normal limits. Few small T2 hyperintense parapelvic cyst noted about the kidneys, benign in appearance, no follow-up imaging recommended. Disc levels: L1-2: Normal interspace. Mild bilateral facet spurring. No stenosis. L2-3: Minimal disc bulge. Mild bilateral facet hypertrophy. No stenosis. L3-4: Mild diffuse disc bulge. Moderate bilateral facet hypertrophy. No significant spinal stenosis. Mild bilateral L3 foraminal narrowing. L4-5: Trace anterolisthesis. Disc desiccation with mild disc bulge. Moderate bilateral facet arthrosis. Resultant mild narrowing of the right lateral recess. Central canal remains adequately patent. Mild right L4 foraminal stenosis. Left neural foramen remains patent. L5-S1: Advanced degenerative intervertebral disc space narrowing with disc desiccation and diffuse disc bulge. Reactive endplate change with marginal endplate osteophytic spurring. Sequelae remote left hemi laminectomy. Mild bile facet hypertrophy. No spinal stenosis. Mild to moderate bilateral L5 foraminal narrowing. IMPRESSION: 1. No acute abnormality within the lumbar spine. Normal MRI appearance of the conus and cauda equina. No high-grade spinal stenosis. 2. Advanced degenerative disc disease at L5-S1 with resultant mild to moderate bilateral L5 foraminal stenosis. 3. Disc bulge with facet hypertrophy at L4-5 with resultant mild right lateral recess and right L4 foraminal  stenosis. 4. Disc bulge with facet hypertrophy at L3-4 with resultant mild bilateral L3 foraminal stenosis. Electronically Signed   By: Morene Hoard M.D.   On: 12/21/2023 02:47   MR THORACIC SPINE WO CONTRAST Result Date: 12/21/2023 CLINICAL DATA:  Initial evaluation for acute myelopathy. EXAM: MRI THORACIC SPINE WITHOUT CONTRAST TECHNIQUE: Multiplanar, multisequence MR imaging of the thoracic spine was performed. No intravenous contrast was administered. COMPARISON:  None available. FINDINGS: Alignment: Trace dextroscoliosis. Alignment otherwise normal preservation of the normal thoracic kyphosis. No listhesis. Vertebrae: Vertebral body height maintained without acute chronic fracture. Bone marrow signal intensity within normal limits. Few scattered benign hemangiomata noted. No worrisome osseous lesions or abnormal marrow edema. Cord:  Normal signal and morphology. Paraspinal and other soft tissues: Paraspinous soft tissues within normal limits. 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 This has been previously evaluated by thyroid  ultrasound and biopsy. Trace right pleural effusion noted. Disc levels: T10-11: Disc desiccation with mild disc bulge. Mild bilateral facet hypertrophy. No spinal stenosis. Mild to moderate bilateral foraminal narrowing. Otherwise, no other significant disc pathology seen within the thoracic spine for age. No other significant facet disease. No spinal stenosis. Foramina remain otherwise patent. IMPRESSION: 1. Normal MRI appearance of the thoracic spinal cord. No cord signal changes to suggest myelopathy. 2. Mild degenerative disc bulging and facet hypertrophy at T10-11 with resultant mild to moderate bilateral foraminal stenosis. No significant spinal stenosis within the thoracic spine. 3. Trace right pleural effusion. Electronically Signed   By: Morene Hoard M.D.   On: 12/21/2023 02:41   CT HEAD WO CONTRAST ( ) Result Date:  12/20/2023 CLINICAL DATA:  Follow-up examination for stroke. EXAM: CT HEAD WITHOUT CONTRAST TECHNIQUE: Contiguous axial images were  obtained from the base of the skull through the vertex without intravenous contrast. RADIATION DOSE REDUCTION: This exam was performed according to the departmental dose-optimization program which includes automated exposure control, adjustment of the mA and/or kV according to patient size and/or use of iterative reconstruction technique. COMPARISON:  Prior MRI from 12/10/2023. FINDINGS: Brain: Cerebral volume within normal limits. Severe chronic microvascular ischemic disease noted. Few scattered remote lacunar infarcts about the thalami noted. Chronic left cerebellar infarct. No acute intracranial hemorrhage. No visible acute large vessel territory infarct. Previously identified subcentimeter infarcts not visible by CT. No mass lesion or midline shift. No hydrocephalus or extra-axial fluid collection. Vascular: No abnormal hyperdense vessel. Calcified atherosclerosis present at skull base. Skull: Scalp soft tissues and calvarium demonstrate no new finding. Sinuses/Orbits: Globes orbital soft tissues within normal limits. Mild scattered mucosal thickening about the sphenoid ethmoidal sinuses. No mastoid effusion. Other: None. IMPRESSION: 1. No acute intracranial abnormality. 2. Severe chronic microvascular ischemic disease with a few scattered remote lacunar infarcts about the thalami and left cerebellum. Recently identified subcentimeter infarcts not visible by CT. Electronically Signed   By: Morene Hoard M.D.   On: 12/20/2023 20:06   DG CHEST PORT 1 VIEW Result Date: 12/19/2023 EXAM: 1 VIEW(S) XRAY OF THE CHEST 12/19/2023 09:00:00 AM COMPARISON: Comparison radiographs 12/09/2023. CLINICAL HISTORY: Aspiration pneumonia. FINDINGS: LUNGS AND PLEURA: Low lung volumes. The patient is rotated to the right. Minimal streaky right greater than left basilar opacities could reflect  bronchovascular crowding, atelectasis, or developing infiltrate. No pulmonary edema. No pleural effusion. No pneumothorax. HEART AND MEDIASTINUM: No acute abnormality of the cardiac and mediastinal silhouettes. BONES AND SOFT TISSUES: No acute osseous abnormality. IMPRESSION: 1. Low lung volumes. Minimal streaky right greater than left basilar opacities could reflect bronchovascular crowding, atelectasis, or developing infiltrate. Electronically signed by: Harrietta Sherry MD 12/19/2023 01:43 PM EST RP Workstation: [id number]   MR Neck Soft Tissue Only W or Wo Contrast Result Date: 12/10/2023 EXAM: MR NECK WITH AND WITHOUT INTRAVENOUS CONTRAST 12/10/2023 10:17:00 AM TECHNIQUE: Multiplanar multisequence MRI of the neck was performed with and without the administration of 9 mL gadobutrol (GADAVIST) 1 MMOL/ML injection. COMPARISON: CT cervical spine 12/09/2023. CLINICAL HISTORY: Neck mass, nonpulsatile. FINDINGS: LIMITATIONS: The examination is mildly to moderately motion degraded. PHARYNX AND LARYNX: As shown on today's CT, there is a mass deep to the hyoid bone which measures 3.1 x 1.8 x 1.9 cm. The mass demonstrates uniform mild to moderate T2 hyperintensity, intrinsic T1 hyperintensity, and no convincing enhancement. The mass extends across the midline bilaterally but is eccentric to the left with mild mass effect on the supraglottic larynx. The midline component of the mass is intimately associated with the hyoid bone and extends between the hyoid bone and thyroid  cartilage. No associated bone or cartilage destruction is evident. No mucosal mass is identified. There is no parapharyngeal or retropharyngeal fluid collection or inflammation. SALIVARY GLANDS: The parotid and submandibular glands appear unremarkable. THYROID : Bilateral thyroid  nodules including a dominant left lower pole nodule measuring at least 2 cm in diameter, previously evaluated by ultrasound. LYMPH NODES: No cervical or supraclavicular  lymphadenopathy is seen. SOFT TISSUES: No appreciable soft tissue swelling or other mass is seen. BRAIN, ORBITS AND SINUSES: Brain reported separately. The visualized portion of the orbits demonstrate no acute abnormality. Mild right ethmoid sinus mucosal thickening. Clear mastoid air cells. BONES: Mild cervical spondylosis. No suspicious marrow lesion. IMPRESSION: 1. 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  Electronically signed by: Dasie Hamburg MD 12/10/2023 10:44 AM EST RP Workstation: [id number]   MR BRAIN W WO CONTRAST Result Date: 12/10/2023 EXAM: MRI BRAIN WITH AND WITHOUT CONTRAST 12/10/2023 10:16:57 AM TECHNIQUE: Multiplanar multisequence MRI of the head/brain was performed with and without the administration of 9 mL gadobutrol (GADAVIST) 1 MMOL/ML injection. COMPARISON: Head CT 12/20/2023 and MRI 07/19/2020. CLINICAL HISTORY: Neuro deficit, acute, stroke suspected. Multiple recent falls. FINDINGS: The study is mildly motion degraded. BRAIN AND VENTRICLES: There is a subcentimeter acute infarct in the posterior limb of the right internal capsule. A subcentimeter focus of restricted diffusion and associated enhancement in the left external capsule likely reflects a subacute infarct. There is also a punctate focus of mild trace diffusion weighted signal hyperintensity with normal ADC in the left occipital white matter which may also reflect a subacute infarct. Chronic microhemorrhages are present in the left thalamus and cerebellum. Confluent T2 hyperintensities in the cerebral white matter bilaterally have progressed from the prior MRI and are nonspecific but compatible with severe chronic small vessel ischemic disease. Chronic lacunar infarcts are again seen in the basal ganglia, thalami, pons, and cerebellum. There is mild cerebral atrophy. A normal variant cavum septum pellucidum et verge is noted. No mass, midline shift, hydrocephalus, or extra axial fluid  collection is identified. Major intracranial vascular flow voids are preserved. ORBITS: No acute abnormality. SINUSES: Moderate right ethmoid sinus mucosal thickening. Clear mastoid air cells. BONES AND SOFT TISSUES: Normal bone marrow signal and enhancement. Neck reported separately. IMPRESSION: 1. 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 2. Likely subacute infarcts in the left external capsule and left occipital white matter. 3. Severe chronic small vessel ischemic disease. Electronically signed by: Dasie Hamburg MD 12/10/2023 10:30 AM EST RP Workstation: [id number]   CT Head Wo Contrast Result Date: 12/09/2023 EXAM: CT HEAD WITHOUT CONTRAST 12/09/2023 04:47:28 PM TECHNIQUE: CT of the head was performed without the administration of intravenous contrast. Automated exposure control, iterative reconstruction, and/or weight based adjustment of the mA/kV was utilized to reduce the radiation dose to as low as reasonably achievable. COMPARISON: None available. CLINICAL HISTORY: Head trauma, minor (Age >= 65y). FINDINGS: BRAIN AND VENTRICLES: No acute hemorrhage. No evidence of acute infarct. 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 Left cerebellar infarct. Extensive periventricular and subcortical white matter low-density changes compatible with chronic microvascular ischemic change. Mild diffuse cerebral volume loss. Cavum septum pellucidum et vergae. No hydrocephalus. No extra-axial collection. No mass effect or midline shift. ORBITS: No acute abnormality. SINUSES: No acute abnormality. SOFT TISSUES AND SKULL: No acute soft tissue abnormality. No skull fracture. Atherosclerotic calcifications in large vessels of skull base. IMPRESSION: 1. No acute intracranial abnormality. 2. 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 3. Extensive periventricular and subcortical white matter changes compatible with chronic microvascular ischemic change.  Electronically signed by: Franky Stanford MD 12/09/2023 05:01 PM EDT RP Workstation: [id number]   CT Cervical Spine Wo Contrast Result Date: 12/09/2023 EXAM: CT CERVICAL SPINE WITHOUT CONTRAST 12/09/2023 04:47:28 PM TECHNIQUE: CT of the cervical spine was performed without the administration of intravenous contrast. Multiplanar reformatted images are provided for review. Automated exposure control, iterative reconstruction, and/or weight based adjustment of the mA/kV was utilized to reduce the radiation dose to as low as reasonably achievable. COMPARISON: None available. CLINICAL HISTORY: Neck trauma (Age >= 65y) FINDINGS: CERVICAL SPINE: BONES AND ALIGNMENT: No acute fracture or traumatic malalignment. DEGENERATIVE CHANGES: No significant degenerative changes. SOFT TISSUES: There is an  ovoid mass deep to the hyoid bone at the level of the area of the glottic folds measuring 2.3 x 1.4 x 2.1 cm. This causes mild mass effect on the airway, but the airway remains widely patent. Non-erupted central maxillary incisors. Edentulous oral cavity. IMPRESSION: 1. 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 MRI of the neck with and without contrast recommended for further evaluation. 2. No acute abnormality of the cervical spine. Electronically signed by: Franky Stanford MD 12/09/2023 05:00 PM EDT RP Workstation: [id number]   DG Chest Portable 1 View Result Date: 12/09/2023 CLINICAL DATA:  Weakness, multiple falls. EXAM: PORTABLE CHEST 1 VIEW, PORTABLE PELVIS COMPARISON:  08/16/2011. FINDINGS: Chest: The heart size and mediastinal contours are within normal limits. Lung volumes are low. No consolidation, effusion, or pneumothorax is seen. Surgical clips are present in the right upper quadrant. No acute osseous abnormality. Pelvis: There is no evidence of acute fracture or dislocation. Mild degenerative  changes are noted at the hips bilaterally. Vascular calcifications are seen in the pelvis and bilateral lower extremities. IMPRESSION: 1. No active disease. 2. No acute fracture or dislocation at the pelvis. Electronically Signed   By: Leita Birmingham M.D.   On: 12/09/2023 16:32   DG Pelvis Portable Result Date: 12/09/2023 CLINICAL DATA:  Weakness, multiple falls. EXAM: PORTABLE CHEST 1 VIEW, PORTABLE PELVIS COMPARISON:  08/16/2011. FINDINGS: Chest: The heart size and mediastinal contours are within normal limits. Lung volumes are low. No consolidation, effusion, or pneumothorax is seen. Surgical clips are present in the right upper quadrant. No acute osseous abnormality. Pelvis: There is no evidence of acute fracture or dislocation. Mild degenerative changes are noted at the hips bilaterally. Vascular calcifications are seen in the pelvis and bilateral lower extremities. IMPRESSION: 1. No active disease. 2. No acute fracture or dislocation at the pelvis. Electronically Signed   By: Leita Birmingham M.D.   On: 12/09/2023 16:32     PHYSICAL EXAM  Temp:  [97.9 F (36.6 C)-98.7 F (37.1 C)] 97.9 F (36.6 C) (11/14 0929) Pulse Rate:  [80-93] 86 (11/14 0929) Resp:  [19] 19 (11/14 0929) BP: (107-155)/(55-81) 127/81 (11/14 0929) SpO2:  [100 %] 100 % (11/14 0929) Weight:  [90.5 kg] 90.5 kg (11/14 0500)  General - Well nourished, well developed, in no apparent distress.  Ophthalmologic - fundi not visualized due to noncooperation.  Cardiovascular - Regular rhythm and rate.  Neuro - awake, alert, eyes open, orientated to hospital and month and year, but not to age. Limited language output with only words, able to follow some simple commands. Able to name 2/3 and repeat simple sentences. No gaze palsy, tracking bilaterally, visual field full. Mild left facial droop. Tongue midline. Bilateral UEs at least 3/5 but not holding constantly against gravity as requested. RLE 3+/5 and LLE 2+/5. Sensation exam not  corporative, b/l FTN not corporative, gait not tested.     ASSESSMENT/PLAN Angela Morrison is a 73 y.o. female with history of hypertension, hyperlipidemia, CAD, COPD, dementia admitted for confusion and left lower extremity weakness. No TNK given due to outside window.    Stroke:  bilateral BG and left temporal WM small infarcts, embolic vs. synchronized small vessel disease  CT head no acute finding.  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 CT head and neck right fetal PCA, atherosclerosis bilateral carotid siphon and bilateral V4  segments. MRI acute subcentimeter infarct in the posterior limb of the right internal capsule.  Subacute infarcts in the left external capsule and left occipital white matter. Limited MRI repeat showed Few scattered subcentimeter acute to early subacute ischemic infarcts involving both cerebral hemispheres and left cerebellum, new as compared to prior MRI 2D Echo EF 55 to 60% LE venous Doppler no DVT Recommend 30-day CardioNet monitoring as outpatient to rule out A-fib (pt refused loop recorder) LDL 95 HgbA1c 5.8 Lovenox  for VTE prophylaxis No antithrombotic prior to admission, now on aspirin  81 mg daily and clopidogrel 75 mg daily DAPT for 3 weeks and then aspirin  alone. Ongoing aggressive stroke risk factor management Therapy recommendations:  SNF Disposition: Pending  Hypertension Stable Avoid low BP Long term BP goal normotensive  Hyperlipidemia Home meds: Lipitor 80 and Zetia  10 LDL 95, goal < 70 Now on Lipitor 80 and Zetia  10 Continue statin at discharge  Other Stroke Risk Factors Advanced age CAD Obesity, Body mass index is 34.25 kg/m.   Other Active Problems COPD Baseline dementia  Hospital day # 12  Neurology will sign off. Please call with questions. Pt will follow up with stroke clinic NP at [facility] in about 4 weeks. Thanks for the consult.   Ary Cummins, MD PhD Stroke Neurology 12/22/2023 12:24 PM    To  contact Stroke Continuity provider, please refer to [email or web address]. After hours, contact General Neurology

## 2023-12-22 NOTE — Plan of Care (Signed)

## 2023-12-22 NOTE — Progress Notes (Signed)
 Speech Language Pathology Treatment: Dysphagia  Patient Details Name: TANAKA GILLEN MRN: [id number] DOB: 01-10-1951 Today's Date: 12/22/2023 Time: 8546-8494 SLP Time Calculation (min) (ACUTE ONLY): 12 min  Assessment / Plan / Recommendation Clinical Impression  Pt is more alert and communicative than during initial SLP eval. She also has less oral holding, needing only Min cues to use liquid wash to clear oral cavity. She has prolonged mastication, and says that she typically wears her dentures to eat home. From a swallowing standpoint, she appears to be ready to advance her diet, but she prefers to stay on Dys 2 due since she does not have her dentures at the moment. Will continue to follow and advance as able.   HPI HPI: 73 year old female presenting to ED 11/01 with weakness and at least 4 falls in the last 24 hours, as well as inability to care for herself in the home. Labs notable for WBC 12.2. UA w/ pyuria and bacteruira. 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 Pt with decline in function with PT 11/12 with concern for possible extension. CT Head negative. Initially passed a swallow screen 11/2; SLP evaluations ordered 11/12. Repeat MRI 11/14 showed few scattered subcetimeter to early subacute ischemic infarcts involving both cerebral hemispheres and L cerebellum, new from MRI on 11/2. PMH: HTN, HLD, CAD, obesity, DM2, depression, arthritis, vitamin D deficiency, vascular dementia      SLP Plan  Continue with current plan of care          Recommendations  Diet recommendations: Dysphagia 2 (fine chop);Thin  liquid Liquids provided via: Cup;Straw Medication Administration: Crushed with puree Supervision: Patient able to self feed;Intermittent supervision to cue for compensatory strategies Compensations: Slow rate;Small sips/bites;Minimize environmental distractions;Follow solids with liquid Postural Changes and/or Swallow Maneuvers: Seated upright 90 degrees                  Oral care BID   Frequent or constant Supervision/Assistance Dysphagia, unspecified (R13.10)     Continue with current plan of care     Leita SAILOR., M.A. CCC-SLP Acute Rehabilitation Services Office: [phone]  Secure chat preferred   12/22/2023, 3:25 PM

## 2023-12-22 NOTE — Consult Note (Addendum)
 ELECTROPHYSIOLOGY CONSULT NOTE  Patient ID: Angela Morrison MRN: [id number], DOB/AGE: September 22, 1950   Admit date: 12/09/2023 Date of Consult: 12/22/2023  Primary Physician: Aisha Harvey, MD Primary Cardiologist: Wilbert Bihari, MD  Primary Electrophysiologist: New to None  Reason for Consultation: Cryptogenic stroke; recommendations regarding Implantable Loop Recorder Insurance: [facility] Medicare  History of Present Illness:  73 y/o F who presented to [facility] 12/09/23 with worsening confusion and multiple falls.   EP has been asked to evaluate Angela Morrison for placement of an implantable loop recorder to monitor for atrial fibrillation by Dr Jerri.  The patient was admitted on 12/09/2023 with bilateral basal ganglia and left temporal WM small infarcts, concern for embolic source.    The pt has undergone workup for stroke including:  CT head no acute finding.  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 CT head and neck right fetal PCA, atherosclerosis bilateral carotid siphon and bilateral V4 segments. MRI acute subcentimeter infarct in the posterior limb of the right internal capsule.  Subacute infarcts in the left external capsule and left occipital white matter. Limited MRI repeat > Few scattered subcentimeter acute to early subacute ischemic infarcts involving both cerebral hemispheres and left cerebellum, new as compared to prior MRI from 12/10/2023. No associated hemorrhage or mass effect. 2. Interval evolution of previously identified small ischemic infarcts, now subacute and/or resolved in appearance. 2D Echo EF 55 to 60% LE venous Doppler no DVT LDL 95 HgbA1c 5.8   The patient has been monitored on telemetry which has demonstrated sinus rhythm with no arrhythmias.  Inpatient stroke work-up Stewart Pimenta not require a TEE per Neurology.   Echocardiogram as above. Lab work is reviewed.  Prior to admission, the patient denies chest pain, shortness of breath, dizziness,  palpitations, or syncope.  She is recovering from her stroke with plans to rehab at SNF  at discharge.  Allergies, Past Medical, Surgical, Social, and Family Histories have been reviewed and are referenced here-in when relevant for medical decision making.   Inpatient Medications:   amoxicillin-clavulanate  1 tablet Oral Q12H   aspirin  EC  81 mg Oral Daily   atorvastatin   80 mg Oral q morning   clopidogrel  75 mg Oral Daily   doxycycline  100 mg Oral Q12H   enoxaparin  (LOVENOX ) injection  40 mg Subcutaneous Q24H   ezetimibe   10 mg Oral Daily   polyethylene glycol  17 g Oral Daily   senna-docusate  1 tablet Oral BID    Physical Exam: Vitals:   12/22/23 0059 12/22/23 0445 12/22/23 0500 12/22/23 0929  BP: (!) 123/58 118/73  127/81  Pulse: 80 88  86  Resp:    19  Temp: 98 F (36.7 C) 98.1 F (36.7 C)  97.9 F (36.6 C)  TempSrc:      SpO2: 100% 100%  100%  Weight:   90.5 kg   Height:        GEN- NAD. A&O x 3. Normal affect. HEENT: Normocephalic, atraumatic Lungs- CTAB, Normal effort.  Heart- Regular rate and rhythm rate and rhythm. No M/G/R.  Extremities- No peripheral edema. no clubbing or cyanosis Skin- warm and dry, no rash or lesion. Neuro- awake, alert, oriented to self / place   12-lead ECG 12/09/23 > NSR 95 bpm (personally reviewed) All prior EKG's in EPIC reviewed with no documented atrial fibrillation  Telemetry SR 70-80's, no AF identified on tele  (personally reviewed)  Assessment and Plan:  Cryptogenic Stroke The patient presents with cryptogenic stroke.  The patient does not have a TEE planned for this AM.  I spoke at length with the patient about monitoring for afib with an implantable loop recorder, including monthly monitor fees which may range from $0-$40.  Risks, benefits, and alteratives to implantable loop recorder were discussed with the patient today.  At this time, the patient declines implantable loop recorder.  Primary team notified and 30 day  monitor ordered for patient.   EP Maeley Matton sign off. Please call back if new needs arise.   Daphne Barrack, NP-C, AGACNP-BC [facility] - Electrophysiology  12/22/2023, 11:15 AM   I have seen and examined this patient with Daphne Barrack.  Agree with above, note added to reflect my findings.  Patient admitted to hospital with worsening confusion and falls.  She does have a history of dementia.  She underwent workup for stroke.  MRI showed scattered subcentimeter acute to subacute ischemic infarcts in both cerebral hemispheres, the cerebellum which was new compared to MRI 12/10/2023.  She has been monitored without arrhythmia.  She feels well and has had no acute complaints  GEN: No acute distress.   Neck: No JVD Cardiac: RRR, no murmurs, rubs, or gallops.  Respiratory: normal BS bases bilaterally. GI: Soft, nontender, non-distended  MS: No edema; No deformity. Neuro:  Nonfocal  Skin: warm and dry Psych: Normal affect    1.  Cryptogenic stroke: Patient has had workup for cryptogenic stroke which has been unrevealing thus far.  She would benefit from ILR implant for monitoring for atrial fibrillation.  We discussed risks and benefits of ILR implant.  Risk include bleeding and infection.  At this time, the patient is not agreeable to implantation.  Would plan for 30-day monitor to look for atrial fibrillation.  Trichelle Lehan M. Kim Oki MD 12/22/2023 4:03 PM

## 2023-12-22 NOTE — TOC Progression Note (Signed)
 Transition of Care [facility]) - Progression Note    Patient Details  Name: Angela Morrison MRN: [id number] Date of Birth: March 17, 1950  [facility] of [facility]) CM/SW Contact  Angela Morrison, [place] Phone Number: 12/22/2023, 11:19 AM  Clinical Narrative:     Patient's shara was approved 11/13-11/16 for [facility], auth ID: [id number]. [name] has a bed open for patient on Monday, which falls under patient's insurance grace period. Provider made aware. CSW called patient's daughter, Angela Morrison, to update an left a call back number. CSW will continue to follow.   Expected Discharge Plan: Skilled Nursing Facility Barriers to Discharge: SNF Pending bed offer               Expected Discharge Plan and Services                                               Social Drivers of Health (SDOH) Interventions SDOH Screenings   Food Insecurity: No Food Insecurity (12/13/2023)  Housing: Low Risk  (12/13/2023)  Transportation Needs: Patient Unable To Answer (12/13/2023)  Utilities: Patient Unable To Answer (12/13/2023)  Depression (PHQ2-9): Medium Risk (05/23/2019)  Social Connections: Patient Unable To Answer (12/13/2023)  Tobacco Use: Low Risk  (12/09/2023)    Readmission Risk Interventions    12/13/2023    3:40 PM  Readmission Risk Prevention Plan  Post Dischage Appt Complete  Medication Screening Complete  Transportation Screening Complete

## 2023-12-22 NOTE — Progress Notes (Signed)
 Progress Note   Patient: Angela Morrison FMW:[id number] DOB: 30-Nov-1950 DOA: 12/09/2023     12 DOS: the patient was seen and examined on 12/22/2023   Brief hospital course: 73 years old female with PMH significant for hypertension, COPD, dementia presented in the ED for further evaluation of altered mental status related to her baseline dementia for 2 to 3 days.  Patient presented here with worsening confusion and multiple falls without loss of consciousness, although family notes that few of these falls have been unwitnessed.  Family also reports patient has exhibited evidence of left lower extremity weakness resulting in some dragging of left leg for greater than a month.  In the ED UA consistent with pyuria and bacteriuria.  CT head showed no evidence of acute intracranial abnormality.  CT cervical spine showed nonspecific neck mass. Patient was admitted for further evaluation and ENT was consulted for neck mass.   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 Some mild-mod calcified plaques noted in carotid siphons, supraclinoid segments, and V4 segments B. Incidental L thyroid  nodule >2cm  -Neurology/stroke team was consulted 11/12 and had been following -Repeat limited brain MRI 11/13 reviewed. Few scattered subcentimeter acute to early subactute ischemic infarcts involving both cerebral hemispheres and L cerebellum, new compared to prior MRI -Neurology recommends 30-day  CardioNet monitoring as outpt to rule out afib -Neurology recommends DAPT with ASA 81mg  and plavix 75mg  x 3 weeks then ASA alone afterwards -Therapy recs for SNF on d/c  Acute encephalopathy Possible Acute cystitis: Already completed 3 days empiric abx.   Neck mass: ENT consulted (Dr. Penne Croak); apprec eval/recs. 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 Outpatient follow-up recommended.   Essential HTN: Irbesartan 75mg  daily held given concerns of recent decline in strength In the setting of stroke   Hyperlipidemia:  LDL 95 Continued on Lipitor 80 and Zetia  10mg    COPD: Continue Duonebs prn.   Hypomagnesemia:   Leukocytosis secondary to likely PNA WBC now trending down to 11k UA clear Recent CXR with possible infiltrate. Cont empiric doxy with augmentin, would treat total 5 days -Seen by SLP, rec for dysphagia 2  Thyroid  nodule ->2cm thyroid  nodule incidentally seen on CTA head/neck -Thyroid  US  pending -TSH 1.771 -Of note, pt had US  guided biopsy of thyroid  nodule in 2013, reviewed. 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     Subjective: Feeling better today  Physical Exam: Vitals:   12/22/23 0445 12/22/23 0500 12/22/23 0929 12/22/23 1229  BP: 118/73  127/81 126/77  Pulse: 88  86 81  Resp:   19 19  Temp: 98.1 F (36.7 C)  97.9 F (36.6 C) 97.7 F (36.5 C)  TempSrc:    Oral  SpO2: 100%  100% 98%  Weight:  90.5 kg    Height:       General exam: Awake, laying in bed, in nad Respiratory system: Normal respiratory effort, no wheezing Cardiovascular system: regular rate, s1, s2 Gastrointestinal system: Soft, nondistended,  positive BS Central nervous system: L sided facial droop Extremities: Perfused, no clubbing Skin: Normal skin turgor, no notable skin lesions seen Psychiatry: Mood normal // affect normal  Data Reviewed:  Labs reviewed: Na 136, K 4.1, Cr 1.15, WBC 11.7, Hgb 11.5, Plts 293  Family Communication: Discussed in detail  with pt's husband over phone on the evening of 11/12, during mid-morning of 11/13. Discussed case with pt's daughter over speaker phone in pt's room 11/14  Disposition: Status is: Inpatient Remains inpatient appropriate because: severity of illness  Planned Discharge Destination: Skilled nursing facility    Author: Garnette Pelt, MD 12/22/2023 3:06 PM  For on call review [email or web address].

## 2023-12-22 NOTE — Progress Notes (Signed)
 PT Cancellation Note  Patient Details Name: DEVYNE HAUGER MRN: [id number] DOB: 08/28/1950   Cancelled Treatment:    Reason Eval/Treat Not Completed: (P) Patient at procedure or test/unavailable Pt working with SLP. PT will not be able to follow back today.   Rudolfo Brandow B. Fleeta Lapidus PT, DPT Acute Rehabilitation Services Please use secure chat or  Call Office [phone]  SHARRA CAYABYAB [facility] 12/22/2023, 4:15 PM

## 2023-12-23 DIAGNOSIS — G9340 Encephalopathy, unspecified: Secondary | ICD-10-CM | POA: Diagnosis not present

## 2023-12-23 DIAGNOSIS — R4182 Altered mental status, unspecified: Secondary | ICD-10-CM | POA: Diagnosis not present

## 2023-12-23 DIAGNOSIS — R296 Repeated falls: Secondary | ICD-10-CM | POA: Diagnosis not present

## 2023-12-23 DIAGNOSIS — I639 Cerebral infarction, unspecified: Secondary | ICD-10-CM | POA: Diagnosis not present

## 2023-12-23 LAB — COMPREHENSIVE METABOLIC PANEL WITH GFR
ALT: 75 U/L — ABNORMAL HIGH (ref 0–44)
AST: 45 U/L — ABNORMAL HIGH (ref 15–41)
Albumin: 2.5 g/dL — ABNORMAL LOW (ref 3.5–5.0)
Alkaline Phosphatase: 88 U/L (ref 38–126)
Anion gap: 8 (ref 5–15)
BUN: 21 mg/dL (ref 8–23)
CO2: 27 mmol/L (ref 22–32)
Calcium: 9.9 mg/dL (ref 8.9–10.3)
Chloride: 103 mmol/L (ref 98–111)
Creatinine, Ser: 0.99 mg/dL (ref 0.44–1.00)
GFR, Estimated: >60 mL/min (ref >60)
Glucose, Bld: 104 mg/dL — ABNORMAL HIGH (ref 70–99)
Potassium: 4.2 mmol/L (ref 3.5–5.1)
Sodium: 138 mmol/L (ref 135–145)
Total Bilirubin: 1 mg/dL (ref 0.0–1.2)
Total Protein: 6.6 g/dL (ref 6.5–8.1)

## 2023-12-23 LAB — CBC
HCT: 36.3 % (ref 36.0–46.0)
Hemoglobin: 11.9 g/dL — ABNORMAL LOW (ref 12.0–15.0)
MCH: 29.2 pg (ref 26.0–34.0)
MCHC: 32.8 g/dL (ref 30.0–36.0)
MCV: 89.2 fL (ref 80.0–100.0)
Platelets: 317 K/uL (ref 150–400)
RBC: 4.07 MIL/uL (ref 3.87–5.11)
RDW: 14.6 % (ref 11.5–15.5)
WBC: 10 K/uL (ref 4.0–10.5)
nRBC: 0 % (ref 0.0–0.2)

## 2023-12-23 LAB — CERULOPLASMIN: Ceruloplasmin: 30.3 mg/dL (ref 19.0–39.0)

## 2023-12-23 LAB — MAGNESIUM: Magnesium: 1.7 mg/dL (ref 1.7–2.4)

## 2023-12-23 NOTE — Progress Notes (Signed)
 Progress Note   Patient: Angela Morrison FMW:[id number] DOB: 1950/06/21 DOA: 12/09/2023     13 DOS: the patient was seen and examined on 12/23/2023   Brief hospital course: 73 years old female with PMH significant for hypertension, COPD, dementia presented in the ED for further evaluation of altered mental status related to her baseline dementia for 2 to 3 days.  Patient presented here with worsening confusion and multiple falls without loss of consciousness, although family notes that few of these falls have been unwitnessed.  Family also reports patient has exhibited evidence of left lower extremity weakness resulting in some dragging of left leg for greater than a month.  In the ED UA consistent with pyuria and bacteriuria.  CT head showed no evidence of acute intracranial abnormality.  CT cervical spine showed nonspecific neck mass. Patient was admitted for further evaluation and ENT was consulted for neck mass.   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 Some mild-mod calcified plaques noted in carotid siphons, supraclinoid segments, and V4 segments B. Incidental L thyroid  nodule >2cm  -Neurology/stroke team was consulted 11/12 and had been following -Repeat limited brain MRI 11/13 reviewed. Few scattered subcentimeter acute to early subactute ischemic infarcts involving both cerebral hemispheres and L cerebellum, new compared to prior MRI -Neurology recommends 30-day  CardioNet monitoring as outpt to rule out afib -Neurology recommended DAPT with ASA 81mg  and plavix 75mg  x 3 weeks then ASA alone afterwards -Therapy recs for SNF on d/c. TOC following.   Acute encephalopathy Possible Acute cystitis: Already completed 3 days empiric abx.   Neck mass: ENT consulted (Dr. Penne Croak); apprec eval/recs. 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 Outpatient follow-up recommended.   Essential HTN: Irbesartan 75mg  daily held given concerns of recent decline in strength In the setting of stroke   Hyperlipidemia:  LDL 95 Continued on Lipitor 80 and Zetia  10mg    COPD: Continue Duonebs prn.   Hypomagnesemia:   Leukocytosis secondary to likely PNA WBC now normal UA clear Recent CXR with possible infiltrate. Cont empiric doxy with augmentin, would treat total 5 days -Seen by SLP, rec for dysphagia 2  Thyroid  nodule ->2cm thyroid  nodule incidentally seen on CTA head/neck -Thyroid  US  was performed, pending results -TSH 1.771 -Of note, pt had US  guided biopsy of thyroid  nodule in 2013, reviewed. 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 Family in room agrees that pt appears improved today  Physical Exam: Vitals:   12/23/23 0500 12/23/23 0501 12/23/23 0810 12/23/23 1111  BP:  112/68 116/70 (!) 157/98  Pulse:  79 78 80  Resp:  18 18 18   Temp:  98.4 F (36.9 C) 97.9 F (36.6 C) 98 F (36.7 C)  TempSrc:  Oral    SpO2:  94% 98% 96%  Weight: 90.2 kg     Height:       General exam: Conversant, in no acute distress Respiratory system: normal  chest rise, clear, no audible wheezing Cardiovascular system: regular rhythm, s1-s2 Gastrointestinal system: Nondistended, nontender, pos BS Central nervous system: No seizures, no tremors Extremities: No cyanosis, no joint deformities Skin: No rashes, no pallor Psychiatry: Affect normal // mood seems normal  Data Reviewed:  Labs  reviewed: Na 138, K 4.2, Cr 0.99, WBC 10.0, Hgb 11.9, Plts 317  Family Communication: Pt in room, family, including pt's husband, is at bedside  Disposition: Status is: Inpatient Remains inpatient appropriate because: severity of illness  Planned Discharge Destination: Skilled nursing facility    Author: Garnette Pelt, MD 12/23/2023 3:01 PM  For on call review [email or web address].

## 2023-12-23 NOTE — Plan of Care (Signed)

## 2023-12-23 NOTE — Progress Notes (Addendum)
 Physical Therapy Treatment Patient Details Name: Angela Morrison MRN: [id number] DOB: Jun 28, 1950 Today's Date: 12/23/2023   History of Present Illness 73 year old female presenting to ED 11/01 with weakness and at least 4 falls in the last 24 hours, as well as inability to care for herself in the home. UA w/ pyuria and bacteruira .Head CT clear  CT cervical spine without contrast showed a nonspecific neck mass. 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 Re-eval ordered 11/12 due to decline in function and stroke work up.   PMH: HTN, HLD, CAD, obesity, DM2, depression, arthritis, vitamin D deficiency, vascular dementia    PT Comments  Pt was able to  participate well in therapy today, but has declined notably after this new neuro insult.  Emphasis on transitions, scooting, sitting balance, STS, pregait and amb 20 feet, all with mod assist of 1-2 person.  Pt showed to be able to follow simple commands given cues and time to process.  She will benefit well for inpatient rehab <3 hours     If plan is discharge home, recommend the following: A lot of help with walking and/or transfers;A lot of help with bathing/dressing/bathroom;Assistance with cooking/housework;Direct supervision/assist for medications management;Direct supervision/assist for financial management;Assist for transportation;Help with stairs or ramp for entrance;Supervision due to cognitive status   Can travel by private vehicle     No  Equipment Recommendations  Other (comment) (TBD)    Recommendations for Other Services       Precautions / Restrictions Precautions Precautions: Fall Recall of Precautions/Restrictions: Impaired Precaution/Restrictions Comments: multiple falls immediately prior to hospitalization     Mobility  Bed Mobility Overal bed mobility: Needs Assistance Bed Mobility:  Supine to Sit, Sit to Supine     Supine to sit: Max assist Sit to supine: Max assist   General bed mobility comments: transitioned up and forward with some prop or assist from R UE.  Pt able with cues and assist to scoot R hip forward, but limited with w/s R and scoot of L hip forward, again needing maximal assist    Transfers Overall transfer level: Needs assistance Equipment used: Rolling walker (2 wheels) Transfers: Sit to/from Stand Sit to Stand: Mod assist, +2 physical assistance           General transfer comment: cues for STS technique/hand placement, mod assist for symmetry, coming forward and boost.    Ambulation/Gait Ambulation/Gait assistance: Mod assist, +2 physical assistance, +2 safety/equipment, Min assist Gait Distance (Feet): 2 Feet (F/B then 20 feet with mod w/shift and stability assist as well as RW maneuvering) Assistive device: Rolling walker (2 wheels) Gait Pattern/deviations: Step-to pattern   Gait velocity interpretation: <1.31 ft/sec, indicative of household ambulator Pre-gait activities: Mod +2 for basic pre gait at EOB with w/shift, stepping, upright posture, truncal rotation. General Gait Details: midly paretic and uncoordinated w/shift and unweighting/advancement of L LE.  foot flat instead of heel toe   Stairs             Wheelchair Mobility     Tilt Bed    Modified Rankin (Stroke Patients Only) Modified Rankin (Stroke Patients Only) Modified Rankin: Moderately severe disability     Balance Overall balance assessment: Needs assistance Sitting-balance support: Single extremity supported, Feet supported Sitting balance-Leahy Scale:  (but unsupport for secs) Sitting balance - Comments: R lean, but CGA to occ min with R UE assist  Postural control: Right lateral lean Standing balance support: Bilateral upper extremity supported, During functional activity, Reliant on assistive device for balance Standing balance-Leahy Scale: Poor                               Communication Communication Communication: Impaired Factors Affecting Communication: Difficulty expressing self (Showed she could express herself if asked her directly to do so)  Cognition Arousal: Alert Behavior During Therapy: Flat affect   PT - Cognitive impairments: History of cognitive impairments                       PT - Cognition Comments: multimodal cues and increased time for following commands Following commands: Impaired Following commands impaired: Follows one step commands with increased time    Cueing Cueing Techniques: Verbal cues, Gestural cues, Visual cues, Tactile cues  Exercises      General Comments        Pertinent Vitals/Pain Pain Assessment Pain Assessment: Faces Faces Pain Scale: Hurts little more Pain Location: R LE with movement Pain Descriptors / Indicators: Sore, Grimacing, Guarding Pain Intervention(s): Monitored during session, Limited activity within patient's tolerance    Home Living                          Prior Function            PT Goals (current goals can now be found in the care plan section) Acute Rehab PT Goals Patient Stated Goal: pt does not state, PT goal to reduce falls risk.  Husbands goal in to improve her function to decrease burden of care. PT Goal Formulation: With patient Time For Goal Achievement: 12/25/23 Potential to Achieve Goals: Fair Progress towards PT goals: Progressing toward goals    Frequency    Min 2X/week      PT Plan      Co-evaluation              AM-PAC PT 6 Clicks Mobility   Outcome Measure  Help needed turning from your back to your side while in a flat bed without using bedrails?: A Lot Help needed moving from lying on your back to sitting on the side of a flat bed without using bedrails?: A Lot Help needed moving to and from a bed to a chair (including a wheelchair)?: Total Help needed standing up from a chair  using your arms (e.g., wheelchair or bedside chair)?: Total Help needed to walk in hospital room?: Total Help needed climbing 3-5 steps with a railing? : Total 6 Click Score: 8    End of Session Equipment Utilized During Treatment: Gait belt Activity Tolerance: Patient limited by fatigue Patient left: in bed;with call bell/phone within reach;with bed alarm set Nurse Communication: Mobility status PT Visit Diagnosis: Other abnormalities of gait and mobility (R26.89);Other symptoms and signs involving the nervous system (R29.898);Muscle weakness (generalized) (M62.81);Unsteadiness on feet (R26.81)     Time: 8743-8671 PT Time Calculation (min) (ACUTE ONLY): 32 min  Charges:    $Gait Training: 8-22 mins $Neuromuscular Re-education: 8-22 mins PT General Charges $$ ACUTE PT VISIT: 1 Visit                     12/23/2023  [place] HERO., PT Acute Rehabilitation Services [phone]  (office)   Vinie GAILS Aristidis Talerico 12/23/2023, 10:25 PM

## 2023-12-24 DIAGNOSIS — G9340 Encephalopathy, unspecified: Secondary | ICD-10-CM | POA: Diagnosis not present

## 2023-12-24 DIAGNOSIS — I639 Cerebral infarction, unspecified: Secondary | ICD-10-CM | POA: Diagnosis not present

## 2023-12-24 DIAGNOSIS — R296 Repeated falls: Secondary | ICD-10-CM | POA: Diagnosis not present

## 2023-12-24 DIAGNOSIS — R4182 Altered mental status, unspecified: Secondary | ICD-10-CM | POA: Diagnosis not present

## 2023-12-24 LAB — VITAMIN B1: Vitamin B1 (Thiamine): 102.1 nmol/L (ref 66.5–200.0)

## 2023-12-24 LAB — COPPER, SERUM: Copper: 126 ug/dL (ref 80–158)

## 2023-12-24 LAB — ZINC: Zinc: 54 ug/dL (ref 44–115)

## 2023-12-24 NOTE — Progress Notes (Signed)
 Progress Note   Patient: Angela Morrison FMW:[id number] DOB: 1950-06-24 DOA: 12/09/2023     14 DOS: the patient was seen and examined on 12/24/2023   Brief hospital course: 73 years old female with PMH significant for hypertension, COPD, dementia presented in the ED for further evaluation of altered mental status related to her baseline dementia for 2 to 3 days.  Patient presented here with worsening confusion and multiple falls without loss of consciousness, although family notes that few of these falls have been unwitnessed.  Family also reports patient has exhibited evidence of left lower extremity weakness resulting in some dragging of left leg for greater than a month.  In the ED UA consistent with pyuria and bacteriuria.  CT head showed no evidence of acute intracranial abnormality.  CT cervical spine showed nonspecific neck mass. Patient was admitted for further evaluation and ENT was consulted for neck mass.   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 Some mild-mod calcified plaques noted in carotid siphons, supraclinoid segments, and V4 segments B. Incidental L thyroid  nodule >2cm  -Neurology/stroke team was consulted 11/12 and had been following -Repeat limited brain MRI 11/13 reviewed. Few scattered subcentimeter acute to early subactute ischemic infarcts involving both cerebral hemispheres and L cerebellum, new compared to prior MRI -Neurology recommends 30-day  CardioNet monitoring as outpt to rule out afib -Neurology recommended DAPT with ASA 81mg  and plavix 75mg  x 3 weeks then ASA alone afterwards -Therapy recs for SNF on d/c. TOC following.  -Clinically stable. Pt reports continued improvement this AM  Acute encephalopathy Initially suspected Acute cystitis: Already completed 3 days empiric abx.   Neck mass: ENT consulted (Dr. Penne Croak); apprec eval/recs. 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 Outpatient follow-up recommended.   Essential HTN: Irbesartan 75mg  daily held given concerns of recent decline in strength In the setting of stroke BP stable at this time   Hyperlipidemia:  LDL 95 Continued on Lipitor 80 and Zetia  10mg    COPD: Continue Duonebs prn.   Hypomagnesemia:   Leukocytosis secondary to likely PNA WBC normalized UA clear Recent CXR with possible infiltrate. Given empiric doxy with augmentin, for treatment of 5 days total -Seen by SLP, rec for dysphagia 2  Thyroid  nodule ->2cm thyroid  nodule incidentally seen on CTA head/neck -Thyroid  US  was performed, pending results. Recommend results f/u as outpt if not back prior to d/c -TSH 1.771 -Of note, pt had US  guided biopsy of thyroid  nodule in 2013, reviewed. 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     Subjective: Reports feeling well and better overall today  Physical Exam: Vitals:   12/24/23 0025 12/24/23 0413 12/24/23 0736 12/24/23 1102  BP: 130/85 127/83 133/83 (!) 141/92  Pulse: 78 69 73 70  Resp: 18 18    Temp: 98.1 F (36.7 C) 97.8 F (36.6 C) (!) 97.5 F (36.4 C) 98.1 F (36.7 C)  TempSrc:  Oral    SpO2: 95% 100% 99% 99%  Weight:  90.2 kg    Height:       General exam: Awake, laying in bed, in nad Respiratory system: Normal respiratory effort, no wheezing Cardiovascular system: regular rate, s1, s2 Gastrointestinal system: Soft, nondistended, positive BS Central nervous system: CN2-12 grossly intact, no  tremors Extremities: Perfused, no clubbing Skin: Normal skin turgor, no notable skin lesions seen Psychiatry: Mood normal // affect normal  Data Reviewed:  Labs reviewed: Na 138, K 4.2, Cr 0.99, WBC 10.0, Hgb 11.9, Plts 317  Family Communication: Pt in room, family currently not at bedside  Disposition: Status is: Inpatient Remains inpatient appropriate because: severity of illness  Planned Discharge Destination: Skilled nursing facility    Author: Garnette Pelt, MD 12/24/2023 2:53 PM  For on call review [email or web address]

## 2023-12-24 NOTE — Plan of Care (Signed)

## 2023-12-25 ENCOUNTER — Other Ambulatory Visit: Payer: Self-pay | Admitting: Cardiology

## 2023-12-25 LAB — COMPREHENSIVE METABOLIC PANEL WITH GFR
ALT: 103 U/L — ABNORMAL HIGH (ref 0–44)
AST: 56 U/L — ABNORMAL HIGH (ref 15–41)
Albumin: 2.8 g/dL — ABNORMAL LOW (ref 3.5–5.0)
Alkaline Phosphatase: 110 U/L (ref 38–126)
Anion gap: 10 (ref 5–15)
BUN: 17 mg/dL (ref 8–23)
CO2: 26 mmol/L (ref 22–32)
Calcium: 10.1 mg/dL (ref 8.9–10.3)
Chloride: 101 mmol/L (ref 98–111)
Creatinine, Ser: 0.97 mg/dL (ref 0.44–1.00)
GFR, Estimated: >60 mL/min (ref >60)
Glucose, Bld: 96 mg/dL (ref 70–99)
Potassium: 4.5 mmol/L (ref 3.5–5.1)
Sodium: 137 mmol/L (ref 135–145)
Total Bilirubin: 1 mg/dL (ref 0.0–1.2)
Total Protein: 7.2 g/dL (ref 6.5–8.1)

## 2023-12-25 LAB — CBC
HCT: 39.1 % (ref 36.0–46.0)
Hemoglobin: 12.8 g/dL (ref 12.0–15.0)
MCH: 29.2 pg (ref 26.0–34.0)
MCHC: 32.7 g/dL (ref 30.0–36.0)
MCV: 89.3 fL (ref 80.0–100.0)
Platelets: 319 K/uL (ref 150–400)
RBC: 4.38 MIL/uL (ref 3.87–5.11)
RDW: 14.5 % (ref 11.5–15.5)
WBC: 11.4 K/uL — ABNORMAL HIGH (ref 4.0–10.5)
nRBC: 0 % (ref 0.0–0.2)

## 2023-12-25 LAB — MAGNESIUM: Magnesium: 1.7 mg/dL (ref 1.7–2.4)

## 2023-12-25 MED ORDER — POLYETHYLENE GLYCOL 3350 17 G PO PACK: 17.0000 g | PACK | Freq: Every day | ORAL | Status: AC | PRN

## 2023-12-25 MED ORDER — CLOPIDOGREL BISULFATE 75 MG PO TABS: 75.0000 mg | ORAL_TABLET | Freq: Every day | ORAL | Status: AC

## 2023-12-25 MED ORDER — TRAZODONE HCL 100 MG PO TABS: 100.0000 mg | ORAL_TABLET | Freq: Every evening | ORAL | Status: AC | PRN

## 2023-12-25 MED ORDER — ASPIRIN 81 MG PO TBEC: 81.0000 mg | DELAYED_RELEASE_TABLET | Freq: Every day | ORAL | Status: AC

## 2023-12-25 NOTE — Progress Notes (Signed)
 Physical Therapy Treatment Patient Details Name: Angela Morrison MRN: [id number] DOB: 02/20/1950 Today's Date: 12/25/2023   History of Present Illness 73 year old female presenting to ED 11/01 with weakness and at least 4 falls in the last 24 hours, as well as inability to care for herself in the home. UA w/ pyuria and bacteruira .Head CT clear  CT cervical spine without contrast showed a nonspecific neck mass. 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 Re-eval ordered 11/12 due to decline in function and stroke work up.   PMH: HTN, HLD, CAD, obesity, DM2, depression, arthritis, vitamin D deficiency, vascular dementia    PT Comments  Pt admitted with above diagnosis. Pt met 0/4 goals due to slower progress than anticipated as well as right LE pain. Goals revised. Pt was soiled on arrival and needed assist to be cleaned prior to getting OOB.  Only able to tolerate a few stands to [place] and get to chair.  Pt currently with functional limitations due to the deficits listed below (see PT Problem List). Pt will benefit from acute skilled PT to increase their independence and safety with mobility to allow discharge.       If plan is discharge home, recommend the following: A lot of help with walking and/or transfers;A lot of help with bathing/dressing/bathroom;Assistance with cooking/housework;Direct supervision/assist for medications management;Direct supervision/assist for financial management;Assist for transportation;Help with stairs or ramp for entrance;Supervision due to cognitive status   Can travel by private vehicle     No  Equipment Recommendations  Other (comment) (TBD)    Recommendations for Other Services       Precautions / Restrictions Precautions Precautions: Fall Recall of Precautions/Restrictions: Impaired Precaution/Restrictions Comments:  multiple falls immediately prior to hospitalization Restrictions Weight Bearing Restrictions Per Provider Order: No     Mobility  Bed Mobility Overal bed mobility: Needs Assistance Bed Mobility: Supine to Sit, Sit to Supine Rolling: Max assist   Supine to sit: Max assist Sit to supine: Max assist   General bed mobility comments: Pt with BM on arrival and unaware. Cleaned pt with pt rolling bil with max assist. Pt c/o pain in right knee throughout.  Pt transitioned up and forward with some prop or assist from R UE.  Pt able with cues and assist to scoot R hip forward, but limited with w/s R and scoot of L hip forward, again needing maximal assist    Transfers Overall transfer level: Needs assistance Equipment used: Ambulation equipment used Transfers: Sit to/from Stand Sit to Stand: Mod assist, +2 physical assistance, From elevated surface           General transfer comment: cues for STS technique/hand placement, mod assist for symmetry, coming forward and boost. Pt stood from [place] with min assist. Pt agreed to stand 1 min and was able on second stand attempt but refused further attempts at standing. Was moved to recliner in [place]. Transfer via Lift Equipment: Stedy  Ambulation/Gait                   Stairs             Wheelchair Mobility     Tilt Bed    Modified Rankin (Stroke Patients Only) Modified Rankin (Stroke Patients Only) Pre-Morbid Rankin Score: Moderate disability Modified Rankin: Moderately severe disability     Balance Overall balance assessment: Needs assistance Sitting-balance support: Single extremity  supported, Feet supported Sitting balance-Leahy Scale: Poor Sitting balance - Comments: R lean, but CGA to occ min with R UE assist Postural control: Right lateral lean Standing balance support: Bilateral upper extremity supported, During functional activity, Reliant on assistive device for balance Standing balance-Leahy Scale:  Poor Standing balance comment: able to stand with mod A using [place], R lateral lean, no overt LOB                            Communication Communication Communication: Impaired Factors Affecting Communication: Difficulty expressing self (Showed she could express herself if asked her directly to do so)  Cognition Arousal: Alert Behavior During Therapy: Flat affect   PT - Cognitive impairments: History of cognitive impairments                       PT - Cognition Comments: multimodal cues and increased time for following commands Following commands: Impaired Following commands impaired: Follows one step commands with increased time    Cueing Cueing Techniques: Verbal cues, Gestural cues, Visual cues, Tactile cues  Exercises General Exercises - Lower Extremity Long Arc Quad: AROM, 10 reps, Both, Seated Hip Flexion/Marching: AROM, 10 reps, Seated    General Comments        Pertinent Vitals/Pain Pain Assessment Pain Assessment: Faces Faces Pain Scale: Hurts little more Breathing: normal Negative Vocalization: none Facial Expression: smiling or inexpressive Body Language: relaxed Consolability: no need to console PAINAD Score: 0 Pain Location: R LE with movement Pain Descriptors / Indicators: Sore, Grimacing, Guarding Pain Intervention(s): Limited activity within patient's tolerance, Monitored during session, Repositioned    Home Living                          Prior Function            PT Goals (current goals can now be found in the care plan section) Acute Rehab PT Goals PT Goal Formulation: With patient Time For Goal Achievement: 01/08/24 Potential to Achieve Goals: Fair Progress towards PT goals: Progressing toward goals    Frequency    Min 2X/week      PT Plan      Co-evaluation              AM-PAC PT 6 Clicks Mobility   Outcome Measure  Help needed turning from your back to your side while in a flat bed  without using bedrails?: A Lot Help needed moving from lying on your back to sitting on the side of a flat bed without using bedrails?: A Lot Help needed moving to and from a bed to a chair (including a wheelchair)?: Total Help needed standing up from a chair using your arms (e.g., wheelchair or bedside chair)?: Total Help needed to walk in hospital room?: Total Help needed climbing 3-5 steps with a railing? : Total 6 Click Score: 8    End of Session Equipment Utilized During Treatment: Gait belt Activity Tolerance: Patient limited by fatigue Patient left: with call bell/phone within reach;in chair;with chair alarm set Nurse Communication: Mobility status PT Visit Diagnosis: Other abnormalities of gait and mobility (R26.89);Other symptoms and signs involving the nervous system (R29.898);Muscle weakness (generalized) (M62.81);Unsteadiness on feet (R26.81)     Time: 8891-8864 PT Time Calculation (min) (ACUTE ONLY): 27 min  Charges:    $Therapeutic Activity: 8-22 mins $Self Care/Home Management: 8-22 PT General Charges $$ ACUTE PT VISIT: 1 Visit  [facility] M,PT Acute Rehab Services [phone]    Stephane JULIANNA Bevel 12/25/2023, 2:36 PM

## 2023-12-25 NOTE — Progress Notes (Signed)
 error

## 2023-12-25 NOTE — Plan of Care (Signed)

## 2023-12-25 NOTE — TOC Transition Note (Signed)
 Transition of Care [facility]) - Discharge Note   Patient Details  Name: Angela Morrison MRN: [id number] Date of Birth: 1950-05-14  Transition of Care [facility]) CM/SW Contact:  Sherline Clack, LCSWA Phone Number: 12/25/2023, 3:20 PM   Clinical Narrative:     Patient will DC to: [name] Anticipated DC date: 12/25/23  Family notified: [name]/daughter Transport by: [facility]   Per MD patient ready for DC to [facility]. RN to call report prior to discharge [phone], rm [phone]). RN, patient, patient's family, and facility notified of DC. Discharge Summary and FL2 sent to facility. DC packet on chart. Ambulance transport requested for patient.   CSW will sign off for now as social work intervention is no longer needed. Please consult us  again if new needs arise.    Final next level of care: Skilled Nursing Facility Barriers to Discharge: Barriers Resolved   Patient Goals and CMS Choice     Choice offered to / list presented to : Patient, Adult Children      Discharge Placement              Patient chooses bed at: [facility] Patient to be transferred to facility by: [facility] Name of family member notified: daughter/[name] Patient and family notified of of transfer: 12/25/23  Discharge Plan and Services Additional resources added to the After Visit Summary for                                       Social Drivers of Health (SDOH) Interventions SDOH Screenings   Food Insecurity: No Food Insecurity (12/13/2023)  Housing: Low Risk  (12/13/2023)  Transportation Needs: Patient Unable To Answer (12/13/2023)  Utilities: Patient Unable To Answer (12/13/2023)  Depression (PHQ2-9): Medium Risk (05/23/2019)  Social Connections: Patient Unable To Answer (12/13/2023)  Tobacco Use: Low Risk  (12/09/2023)     Readmission Risk Interventions    12/13/2023    3:40 PM  Readmission Risk Prevention Plan  Post Dischage Appt Complete  Medication Screening Complete   Transportation Screening Complete

## 2023-12-25 NOTE — Plan of Care (Signed)
  Problem: Coping: Goal: Will verbalize positive feelings about self Outcome: Progressing Goal: Will identify appropriate support needs Outcome: Progressing   Problem: Health Behavior/Discharge Planning: Goal: Ability to manage health-related needs will improve Outcome: Progressing Goal: Goals will be collaboratively established with patient/family Outcome: Progressing   Problem: Self-Care: Goal: Ability to participate in self-care as condition permits will improve Outcome: Progressing Goal: Verbalization of feelings and concerns over difficulty with self-care will improve Outcome: Progressing Goal: Ability to communicate needs accurately will improve Outcome: Progressing   Problem: Nutrition: Goal: Risk of aspiration will decrease Outcome: Progressing Goal: Dietary intake will improve Outcome: Progressing

## 2023-12-25 NOTE — Discharge Summary (Addendum)
 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  Discharge Diagnoses: Principal Problem:   Acute encephalopathy Active Problems:   Neck mass  Resolved Problems:   * No resolved hospital problems. *  Hospital Course: 73 years old female with PMH significant for hypertension, COPD, dementia presented in the ED for further evaluation of altered mental status related to her baseline dementia for 2 to 3 days.  Patient presented here with worsening confusion and multiple falls without loss of consciousness, although family notes that few of these falls have been unwitnessed.  Family also reports patient has exhibited evidence of left lower extremity weakness resulting in some dragging of left leg for greater than a month.  In the ED UA consistent with pyuria and bacteriuria.  CT head showed no evidence of acute intracranial abnormality.  CT cervical spine showed nonspecific neck mass. Patient was admitted for further evaluation and ENT was consulted for neck mass.   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 Some mild-mod calcified plaques noted in carotid siphons, supraclinoid segments, and V4 segments B. Incidental L thyroid  nodule >2cm  -Neurology/stroke team was consulted 11/12 and had been following -Repeat limited brain MRI 11/13 reviewed. Few scattered subcentimeter acute to early subactute ischemic infarcts involving both cerebral hemispheres and L cerebellum, new compared to prior MRI -Neurology recommends 30-day CardioNet monitoring as outpt to rule out afib -Neurology recommended DAPT with ASA 81mg  and plavix 75mg  x 3 weeks then ASA alone afterwards -Therapy recs for SNF on d/c.    Acute encephalopathy Initially suspected Acute cystitis: Already completed 3 days empiric abx.   Neck mass: ENT consulted (Angela Morrison); apprec eval/recs. 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 Outpatient follow-up recommended.   Essential HTN: Irbesartan 75mg  daily held given concerns of recent decline in strength In the setting of stroke BP stable at this time   Hyperlipidemia:  LDL 95 Continued on Lipitor 80 and Zetia  10mg    COPD: Continue Duonebs prn while in hospital   Hypomagnesemia:   Leukocytosis secondary to likely PNA WBC normalized UA clear Recent CXR with possible infiltrate. Given empiric doxy with augmentin, for treatment of 5 days total -Seen by SLP, rec for dysphagia 2   Thyroid  nodule ->2cm thyroid  nodule incidentally seen on CTA head/neck -TSH 1.771 -Of note, pt had US  guided biopsy of thyroid  nodule in 2013, reviewed. 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 None meets criteria for biopsy with recommended US  f/u at 1, 2, 3, and 5 yrs       Consultants: Neurology, Cardiology Procedures performed:   Disposition: Skilled nursing facility Diet  recommendation:  Dysphagia type 2 thin Liquid DISCHARGE MEDICATION: Allergies as of 12/25/2023       Reactions   Crestor [rosuvastatin]    constipation   Morphine  Sulfate Other (See Comments)   Penicillins Itching        Medication List     STOP taking these medications    potassium chloride  10 MEQ tablet Commonly known as: KLOR-CON    valsartan 80 MG tablet Commonly known as: DIOVAN       TAKE these medications    aspirin  EC 81 MG tablet Take 1 tablet (81 mg total) by mouth daily. Swallow whole. Start taking on: December 26, 2023   atorvastatin  80 MG tablet Commonly known as: LIPITOR TAKE ONE TABLET BY MOUTH EVERY MORNING   clopidogrel 75 MG tablet Commonly known as: PLAVIX Take 1 tablet (75 mg total) by mouth daily. Start taking on: December 26, 2023   ezetimibe  10 MG tablet Commonly known as: ZETIA  Take 10 mg by mouth daily.   polyethylene glycol 17 g packet Commonly known as: MIRALAX / GLYCOLAX Take 17 g by mouth daily as needed for moderate constipation.   traZODone 100 MG tablet Commonly known as: DESYREL Take 1 tablet (100 mg total) by mouth at bedtime as needed for sleep.        Contact information for follow-up providers     [facility]. Schedule an appointment as soon as possible for a visit in 1 month(s).   Specialty: Neurology Why: stroke clinic Contact information: [address] [place]  [address] [phone]        Follow up with Cardiology for 30 day heart monior Follow up.   Why: Hospital follow up             Contact information for after-discharge care     Destination     [facility] .   Service: Skilled Nursing Contact information: [address] [place]  [address] [phone]                    Discharge Exam: Angela Morrison   12/22/23 0500 12/23/23 0500 12/24/23 0413  Weight: 90.5 kg 90.2  kg 90.2 kg   General exam: Awake, laying in bed, in nad Respiratory system: Normal respiratory effort, no wheezing Cardiovascular system: regular rate, s1, s2 Gastrointestinal system: Soft, nondistended, positive BS Central nervous system: CN2-12 grossly intact, LUE and BLE weakness Extremities: Perfused, no clubbing Skin: Normal skin turgor, no notable skin lesions seen Psychiatry: Mood normal // affect normal  Condition at discharge: fair  The results of significant diagnostics from this hospitalization (including imaging, microbiology, ancillary and laboratory) are listed below for reference.   Imaging Studies: US  THYROID  Result Date: 12/25/2023 EXAM: US  THYROID  12/21/2023 04:56:07 PM TECHNIQUE: Real-time ultrasound scan of the thyroid  gland and soft tissues of the neck with image documentation. COMPARISON: 09/05/2011 CLINICAL HISTORY: Thyroid  nodule greater than or equal to 1.5 cm in diameter incidentally noted on imaging study. Previous FNA biopsy lower left nodule 09/15/2011. FINDINGS: Right thyroid  lobe: 4.7 x 2.2 x 2.3 cm (previously 5.3 x 1.7 x 1.9 cm) Left thyroid  lobe: 6.8 x 1.7 x 2.6 cm (previously 6.4 x 2.4 x 3.1  cm) Isthmus: . 0.2 cm thickness (previously 0.3 cm) Echotexture: Normal Echotexture. Vascularity: Normal Vascularity. Thyroid  Nodules: Nodule 1 (mid right): 2.4 x 1.6 x 1.7 cm. Solid or almost completely solid, hyperechoic or isoechoic, wider than tall, smooth. TIRADS Category 3. Nodule 2 (inferior right): 1 x 1.1 x 0.9 cm. Solid or almost completely solid, hypoechoic, wider than tall, smooth. TIRADS Category 4. Nodule 3 (inferior left): 1.5 x 2.9 x 3.4 cm (previously 3.1 x 2 x 3.1 cm). This was previously biopsied. Solid or almost completely solid, hyperechoic or isoechoic, wider than tall, smooth, none. Nodule 4 (mid left): 1 cm complex cyst. No biopsy or follow up recommended. Mixed cystic and solid, hyperechoic or isoechoic, wider than tall, smooth, none. TIRADS Category  2. Nodule 5 (superior left): 1.1 x 1.2 x 0.9 cm. Solid or almost completely solid, hyperechoic or isoechoic, wider than tall, smooth, none. TIRADS Category 3. Soft tissues: No visualized lymphadenopathy IMPRESSION: 1. Multiple nodules. None meets criteria for biopsy. Recommend Ultrasound follow-up at 1, 2, 3, and 5 years. 2. No suspicious cervical lymph nodes. Electronically signed by: Katheleen Faes MD 12/25/2023 12:45 PM EST RP Workstation: [id number]   MR BRAIN WO CONTRAST Result Date: 12/22/2023 CLINICAL DATA:  Follow-up examination for stroke. EXAM: MRI HEAD WITHOUT CONTRAST TECHNIQUE: Multiplanar, multiecho pulse sequences of the brain and surrounding structures were obtained without intravenous contrast. COMPARISON:  Prior MRI from 12/10/2023. FINDINGS: Brain: A limited MRI consisting of diffusion weighted imaging and SWI sequence only were performed. There has been interval evolution of previously identified small ischemic infarcts, now subacute and/or resolved in appearance. However, there are a few scattered new subcentimeter foci of diffusion signal abnormality seen involving both cerebral hemispheres (series 5, images 85, 76, 74, 72, 71). Additional subcentimeter focus now seen at the left cerebellum (series 5, image 63). These are new from prior, and are consistent with small acute to early subacute ischemic infarcts. No associated hemorrhage or mass effect. Otherwise, gray-white matter differentiation grossly maintained. No acute intracranial hemorrhage. Few scattered chronic micro hemorrhages noted, likely hypertensive in nature. No mass lesion or midline shift. No hydrocephalus or extra-axial fluid collection. Vascular: Not well assessed on this limited exam. Skull and upper cervical spine: Not well assessed on this limited exam. Sinuses/Orbits: Not well assessed on this limited exam. Other: None. IMPRESSION: 1. Few scattered subcentimeter acute to early subacute ischemic infarcts involving both  cerebral hemispheres and left cerebellum, new as compared to prior MRI from 12/10/2023. No associated hemorrhage or mass effect. 2. Interval evolution of previously identified small ischemic infarcts, now subacute and/or resolved in appearance. Electronically Signed   By: Morene Hoard M.D.   On: 12/22/2023 00:38   ECHOCARDIOGRAM COMPLETE Result Date: 12/21/2023    ECHOCARDIOGRAM REPORT   Patient Name:   Angela Morrison Date of Exam: 12/21/2023 Medical Rec #:  [id number]         Height:       64.0 in Accession #:    [id number]        Weight:       205.0 lb Date of Birth:  [birth year]         BSA:          1.977 m Patient Age:    73 years          BP:           121/86 mmHg Patient Gender: F  HR:           79 bpm. Exam Location:  Inpatient Procedure: 2D Echo, Cardiac Doppler and Color Doppler (Both Spectral and Color            Flow Doppler were utilized during procedure). Indications:    Stroke  History:        Patient has no prior history of Echocardiogram examinations. CAD                 and Previous Myocardial Infarction, Stroke,                 Signs/Symptoms:Edema; Risk Factors:Hypertension, Dyslipidemia                 and Diabetes.  Sonographer:    Juliene Rucks Referring Phys: [phone] Cesar Rogerson K Yael Angerer  Sonographer Comments: Suboptimal subcostal window and patient is obese. Image acquisition challenging due to patient body habitus. IMPRESSIONS  1. Left ventricular ejection fraction, by estimation, is 55 to 60%. The left ventricle has normal function. The left ventricle has no regional wall motion abnormalities. There is mild concentric left ventricular hypertrophy. Left ventricular diastolic parameters are consistent with Grade I diastolic dysfunction (impaired relaxation).  2. Right ventricular systolic function is normal. The right ventricular size is normal. Tricuspid regurgitation signal is inadequate for assessing PA pressure.  3. The mitral valve is normal in structure. No evidence  of mitral valve regurgitation. No evidence of mitral stenosis.  4. The aortic valve is normal in structure. Aortic valve regurgitation is not visualized. No aortic stenosis is present.  5. The inferior vena cava is normal in size with greater than 50% respiratory variability, suggesting right atrial pressure of 3 mmHg. FINDINGS  Left Ventricle: Left ventricular ejection fraction, by estimation, is 55 to 60%. The left ventricle has normal function. The left ventricle has no regional wall motion abnormalities. The left ventricular internal cavity size was normal in size. There is  mild concentric left ventricular hypertrophy. Left ventricular diastolic parameters are consistent with Grade I diastolic dysfunction (impaired relaxation). Right Ventricle: The right ventricular size is normal. No increase in right ventricular wall thickness. Right ventricular systolic function is normal. Tricuspid regurgitation signal is inadequate for assessing PA pressure. Left Atrium: Left atrial size was normal in size. Right Atrium: Right atrial size was normal in size. Pericardium: There is no evidence of pericardial effusion. Mitral Valve: The mitral valve is normal in structure. No evidence of mitral valve regurgitation. No evidence of mitral valve stenosis. MV peak gradient, 5.1 mmHg. The mean mitral valve gradient is 1.0 mmHg. Tricuspid Valve: The tricuspid valve is normal in structure. Tricuspid valve regurgitation is not demonstrated. No evidence of tricuspid stenosis. Aortic Valve: The aortic valve is normal in structure. Aortic valve regurgitation is not visualized. No aortic stenosis is present. Pulmonic Valve: The pulmonic valve was normal in structure. Pulmonic valve regurgitation is not visualized. No evidence of pulmonic stenosis. Aorta: The aortic root is normal in size and structure. Venous: The inferior vena cava is normal in size with greater than 50% respiratory variability, suggesting right atrial pressure of 3  mmHg. IAS/Shunts: No atrial level shunt detected by color flow Doppler.  LEFT VENTRICLE PLAX 2D LVIDd:         3.70 cm   Diastology LVIDs:         2.60 cm   LV e' medial:    4.57 cm/s LV PW:         1.00 cm  LV E/e' medial:  16.5 LV IVS:        1.10 cm   LV e' lateral:   7.07 cm/s LVOT diam:     2.00 cm   LV E/e' lateral: 10.7 LV SV:         59 LV SV Index:   30 LVOT Area:     3.14 cm  RIGHT VENTRICLE RV S prime:     13.20 cm/s TAPSE (M-mode): 1.9 cm LEFT ATRIUM           Index        RIGHT ATRIUM           Index LA diam:      2.40 cm 1.21 cm/m   RA Area:     12.90 cm LA Vol (A2C): 17.2 ml 8.70 ml/m   RA Volume:   28.80 ml  14.57 ml/m LA Vol (A4C): 45.3 ml 22.91 ml/m  AORTIC VALVE LVOT Vmax:   95.00 cm/s LVOT Vmean:  59.200 cm/s LVOT VTI:    0.189 m  AORTA Ao Root diam: 2.60 cm Ao Asc diam:  2.80 cm MITRAL VALVE MV Area (PHT): 3.31 cm     SHUNTS MV Area VTI:   1.65 cm     Systemic VTI:  0.19 m MV Peak grad:  5.1 mmHg     Systemic Diam: 2.00 cm MV Mean grad:  1.0 mmHg MV Vmax:       1.13 m/s MV Vmean:      55.7 cm/s MV Decel Time: 229 msec MV E velocity: 75.50 cm/s MV A velocity: 121.00 cm/s MV E/A ratio:  0.62 Kardie Tobb DO Electronically signed by Dub Huntsman DO Signature Date/Time: 12/21/2023/3:31:39 PM    Final    VAS US  LOWER EXTREMITY VENOUS (DVT) Result Date: 12/21/2023  Lower Venous DVT Study Patient Name:  MARILOU BARNFIELD  Date of Exam:   12/21/2023 Medical Rec #: [id number]          Accession #:    [id number] Date of Birth: 11-22-1950          Patient Gender: F Patient Age:   85 years Exam Location:  [facility] Procedure:      VAS US  LOWER EXTREMITY VENOUS (DVT) Referring Phys: ARY XU --------------------------------------------------------------------------------  Indications: Stroke.  Risk Factors: None identified. Limitations: Poor ultrasound/tissue interface and patient positioning. Comparison Study: No prior studies. Performing Technologist: Cordella Collet RVT  Examination  Guidelines: A complete evaluation includes B-mode imaging, spectral Doppler, color Doppler, and power Doppler as needed of all accessible portions of each vessel. Bilateral testing is considered an integral part of a complete examination. Limited examinations for reoccurring indications may be performed as noted. The reflux portion of the exam is performed with the patient in reverse Trendelenburg.  +---------+---------------+---------+-----------+----------+--------------+ RIGHT    CompressibilityPhasicitySpontaneityPropertiesThrombus Aging +---------+---------------+---------+-----------+----------+--------------+ CFV      Full           Yes      Yes                                 +---------+---------------+---------+-----------+----------+--------------+ SFJ      Full                                                        +---------+---------------+---------+-----------+----------+--------------+  FV Prox  Full                                                        +---------+---------------+---------+-----------+----------+--------------+ FV Mid   Full                                                        +---------+---------------+---------+-----------+----------+--------------+ FV DistalFull                                                        +---------+---------------+---------+-----------+----------+--------------+ PFV      Full                                                        +---------+---------------+---------+-----------+----------+--------------+ POP      Full           Yes      Yes                                 +---------+---------------+---------+-----------+----------+--------------+ PTV      Full                                                        +---------+---------------+---------+-----------+----------+--------------+ PERO     Full                                                         +---------+---------------+---------+-----------+----------+--------------+   +---------+---------------+---------+-----------+----------+-------------------+ LEFT     CompressibilityPhasicitySpontaneityPropertiesThrombus Aging      +---------+---------------+---------+-----------+----------+-------------------+ CFV      Full           Yes      Yes                                      +---------+---------------+---------+-----------+----------+-------------------+ SFJ      Full                                                             +---------+---------------+---------+-----------+----------+-------------------+ FV Prox  Full                                                             +---------+---------------+---------+-----------+----------+-------------------+  FV Mid   Full                                                             +---------+---------------+---------+-----------+----------+-------------------+ FV Distal               Yes      Yes                                      +---------+---------------+---------+-----------+----------+-------------------+ PFV      Full                                                             +---------+---------------+---------+-----------+----------+-------------------+ POP      Full           Yes      Yes                                      +---------+---------------+---------+-----------+----------+-------------------+ PTV      Full                                                             +---------+---------------+---------+-----------+----------+-------------------+ PERO                                                  Not well visualized +---------+---------------+---------+-----------+----------+-------------------+     Summary: RIGHT: - There is no evidence of deep vein thrombosis in the lower extremity.  - No cystic structure found in the popliteal fossa.  LEFT: - There is no  evidence of deep vein thrombosis in the lower extremity. However, portions of this examination were limited- see technologist comments above.  - No cystic structure found in the popliteal fossa.  *See table(s) above for measurements and observations. Electronically signed by Debby Robertson on 12/21/2023 at 12:10:03 PM.    Final    CT ANGIO HEAD NECK W WO CM Result Date: 12/21/2023 EXAM: CTA HEAD AND NECK WITHOUT AND WITH 12/21/2023 03:24:53 AM TECHNIQUE: CTA of the head and neck was performed without and with the administration of 75 mL of iohexol  (OMNIPAQUE ) 350 MG/ML injection. Multiplanar 2D and/or 3D reformatted images are provided for review. Automated exposure control, iterative reconstruction, and/or weight based adjustment of the mA/kV was utilized to reduce the radiation dose to as low as reasonably achievable. Stenosis of the internal carotid arteries measured using NASCET criteria. COMPARISON: MRI of the neck dated 12/10/2023. CLINICAL HISTORY: Neuro deficit, acute, stroke suspected. FINDINGS: CTA NECK: AORTIC ARCH AND ARCH VESSELS: There is mild calcific plaque present within the aortic arch. No dissection or arterial injury. No significant stenosis of  the brachiocephalic or subclavian arteries. CERVICAL CAROTID ARTERIES: Calcific plaque is present within the origins of the internal carotid arteries bilaterally, but there is no associated luminal stenosis. The cervical segments of the internal carotid arteries are tortuous, but normal in caliber. No dissection or arterial injury. No hemodynamically significant stenosis by NASCET criteria. CERVICAL VERTEBRAL ARTERIES: The vertebral arteries are codominant and normal in caliber. No dissection, arterial injury, or significant stenosis. LUNGS AND MEDIASTINUM: Unremarkable. SOFT TISSUES: A nodule is again demonstrated within the left lobe of the thyroid , which appears to measure greater than 2 cm. BONES: No acute abnormality. CTA HEAD: ANTERIOR  CIRCULATION: There is mild-to-moderate calcific plaque within the carotid siphons and supraclinoid segments, with less than 20% luminal stenosis bilaterally. No significant stenosis of the anterior cerebral arteries. No significant stenosis of the middle cerebral arteries. No aneurysm. POSTERIOR CIRCULATION: There is mild-to-moderate calcific plaque present within the V4 segments bilaterally, but no flow-limiting stenosis. There is fetal type origin of the right posterior cerebral artery with a diminutive right P1 segment. No significant stenosis of the basilar artery. No aneurysm. OTHER: No dural venous sinus thrombosis on this non-dedicated study. IMPRESSION: 1. No large vessel occlusion or hemodynamically significant stenosis in the head or neck 2. Mild-to-moderate calcific plaque within the carotid siphons, supraclinoid segments, and V4 segments bilaterally, with less than 20% luminal stenosis in the carotid siphons and supraclinoid segments and no flow-limiting stenosis in the V4 segments 3. Fetal-type origin of the right posterior cerebral artery with a diminutive right P1 segment 4. Incidental left thyroid  nodule measuring >2 cm, stable from 12/10/2023; patient age assumed 60 per guideline defaultrecommend non-emergent thyroid  ultrasound for further characterization as per ACR guidelines Electronically signed by: Evalene Coho MD 12/21/2023 04:46 AM EST RP Workstation: [id number]   MR LUMBAR SPINE WO CONTRAST Result Date: 12/21/2023 CLINICAL DATA:  Initial evaluation for acute myelopathy. EXAM: MRI LUMBAR SPINE WITHOUT CONTRAST TECHNIQUE: Multiplanar, multisequence MR imaging of the lumbar spine was performed. No intravenous contrast was administered. COMPARISON:  Prior MRI from 03/18/2009. FINDINGS: Segmentation: Standard. Lowest well-formed disc space labeled the L5-S1 level. Alignment: Trace 2 mm facet mediated anterolisthesis of L4 on L5. Alignment otherwise normal preservation of the normal  lumbar lordosis. Vertebrae: Vertebral body height maintained without acute or chronic fracture. Bone marrow signal intensity within normal limits. Few small benign hemangiomata noted. No worrisome osseous lesions. No abnormal marrow edema. Conus medullaris and cauda equina: Conus extends to the L1-2 level. Conus and cauda equina appear normal. Paraspinal and other soft tissues: Paraspinous soft tissues within normal limits. Few small T2 hyperintense parapelvic cyst noted about the kidneys, benign in appearance, no follow-up imaging recommended. Disc levels: L1-2: Normal interspace. Mild bilateral facet spurring. No stenosis. L2-3: Minimal disc bulge. Mild bilateral facet hypertrophy. No stenosis. L3-4: Mild diffuse disc bulge. Moderate bilateral facet hypertrophy. No significant spinal stenosis. Mild bilateral L3 foraminal narrowing. L4-5: Trace anterolisthesis. Disc desiccation with mild disc bulge. Moderate bilateral facet arthrosis. Resultant mild narrowing of the right lateral recess. Central canal remains adequately patent. Mild right L4 foraminal stenosis. Left neural foramen remains patent. L5-S1: Advanced degenerative intervertebral disc space narrowing with disc desiccation and diffuse disc bulge. Reactive endplate change with marginal endplate osteophytic spurring. Sequelae remote left hemi laminectomy. Mild bile facet hypertrophy. No spinal stenosis. Mild to moderate bilateral L5 foraminal narrowing. IMPRESSION: 1. No acute abnormality within the lumbar spine. Normal MRI appearance of the conus and cauda equina. No high-grade spinal stenosis. 2. Advanced degenerative disc disease  at L5-S1 with resultant mild to moderate bilateral L5 foraminal stenosis. 3. Disc bulge with facet hypertrophy at L4-5 with resultant mild right lateral recess and right L4 foraminal stenosis. 4. Disc bulge with facet hypertrophy at L3-4 with resultant mild bilateral L3 foraminal stenosis. Electronically Signed   By: Morene Hoard M.D.   On: 12/21/2023 02:47   MR THORACIC SPINE WO CONTRAST Result Date: 12/21/2023 CLINICAL DATA:  Initial evaluation for acute myelopathy. EXAM: MRI THORACIC SPINE WITHOUT CONTRAST TECHNIQUE: Multiplanar, multisequence MR imaging of the thoracic spine was performed. No intravenous contrast was administered. COMPARISON:  None available. FINDINGS: Alignment: Trace dextroscoliosis. Alignment otherwise normal preservation of the normal thoracic kyphosis. No listhesis. Vertebrae: Vertebral body height maintained without acute chronic fracture. Bone marrow signal intensity within normal limits. Few scattered benign hemangiomata noted. No worrisome osseous lesions or abnormal marrow edema. Cord:  Normal signal and morphology. Paraspinal and other soft tissues: Paraspinous soft tissues within normal limits. 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 This has been previously evaluated by thyroid  ultrasound and biopsy. Trace right pleural effusion noted. Disc levels: T10-11: Disc desiccation with mild disc bulge. Mild bilateral facet hypertrophy. No spinal stenosis. Mild to moderate bilateral foraminal narrowing. Otherwise, no other significant disc pathology seen within the thoracic spine for age. No other significant facet disease. No spinal stenosis. Foramina remain otherwise patent. IMPRESSION: 1. Normal MRI appearance of the thoracic spinal cord. No cord signal changes to suggest myelopathy. 2. Mild degenerative disc bulging and facet hypertrophy at T10-11 with resultant mild to moderate bilateral foraminal stenosis. No significant spinal stenosis within the thoracic spine. 3. Trace right pleural effusion. Electronically Signed   By: Morene Hoard M.D.   On: 12/21/2023 02:41   CT HEAD WO CONTRAST ( ) Result Date: 12/20/2023 CLINICAL DATA:  Follow-up examination for stroke. EXAM: CT HEAD WITHOUT CONTRAST TECHNIQUE: Contiguous axial images were obtained from the base of the  skull through the vertex without intravenous contrast. RADIATION DOSE REDUCTION: This exam was performed according to the departmental dose-optimization program which includes automated exposure control, adjustment of the mA and/or kV according to patient size and/or use of iterative reconstruction technique. COMPARISON:  Prior MRI from 12/10/2023. FINDINGS: Brain: Cerebral volume within normal limits. Severe chronic microvascular ischemic disease noted. Few scattered remote lacunar infarcts about the thalami noted. Chronic left cerebellar infarct. No acute intracranial hemorrhage. No visible acute large vessel territory infarct. Previously identified subcentimeter infarcts not visible by CT. No mass lesion or midline shift. No hydrocephalus or extra-axial fluid collection. Vascular: No abnormal hyperdense vessel. Calcified atherosclerosis present at skull base. Skull: Scalp soft tissues and calvarium demonstrate no new finding. Sinuses/Orbits: Globes orbital soft tissues within normal limits. Mild scattered mucosal thickening about the sphenoid ethmoidal sinuses. No mastoid effusion. Other: None. IMPRESSION: 1. No acute intracranial abnormality. 2. Severe chronic microvascular ischemic disease with a few scattered remote lacunar infarcts about the thalami and left cerebellum. Recently identified subcentimeter infarcts not visible by CT. Electronically Signed   By: Morene Hoard M.D.   On: 12/20/2023 20:06   DG CHEST PORT 1 VIEW Result Date: 12/19/2023 EXAM: 1 VIEW(S) XRAY OF THE CHEST 12/19/2023 09:00:00 AM COMPARISON: Comparison radiographs 12/09/2023. CLINICAL HISTORY: Aspiration pneumonia. FINDINGS: LUNGS AND PLEURA: Low lung volumes. The patient is rotated to the right. Minimal streaky right greater than left basilar opacities could reflect bronchovascular crowding, atelectasis, or developing infiltrate. No pulmonary edema. No pleural effusion. No pneumothorax. HEART AND MEDIASTINUM: No acute  abnormality of the  cardiac and mediastinal silhouettes. BONES AND SOFT TISSUES: No acute osseous abnormality. IMPRESSION: 1. Low lung volumes. Minimal streaky right greater than left basilar opacities could reflect bronchovascular crowding, atelectasis, or developing infiltrate. Electronically signed by: Harrietta Sherry MD 12/19/2023 01:43 PM EST RP Workstation: [id number]   MR Neck Soft Tissue Only W or Wo Contrast Result Date: 12/10/2023 EXAM: MR NECK WITH AND WITHOUT INTRAVENOUS CONTRAST 12/10/2023 10:17:00 AM TECHNIQUE: Multiplanar multisequence MRI of the neck was performed with and without the administration of 9 mL gadobutrol (GADAVIST) 1 MMOL/ML injection. COMPARISON: CT cervical spine 12/09/2023. CLINICAL HISTORY: Neck mass, nonpulsatile. FINDINGS: LIMITATIONS: The examination is mildly to moderately motion degraded. PHARYNX AND LARYNX: As shown on today's CT, there is a mass deep to the hyoid bone which measures 3.1 x 1.8 x 1.9 cm. The mass demonstrates uniform mild to moderate T2 hyperintensity, intrinsic T1 hyperintensity, and no convincing enhancement. The mass extends across the midline bilaterally but is eccentric to the left with mild mass effect on the supraglottic larynx. The midline component of the mass is intimately associated with the hyoid bone and extends between the hyoid bone and thyroid  cartilage. No associated bone or cartilage destruction is evident. No mucosal mass is identified. There is no parapharyngeal or retropharyngeal fluid collection or inflammation. SALIVARY GLANDS: The parotid and submandibular glands appear unremarkable. THYROID : Bilateral thyroid  nodules including a dominant left lower pole nodule measuring at least 2 cm in diameter, previously evaluated by ultrasound. LYMPH NODES: No cervical or supraclavicular lymphadenopathy is seen. SOFT TISSUES: No appreciable soft tissue swelling or other mass is seen. BRAIN, ORBITS AND SINUSES: Brain reported separately. The  visualized portion of the orbits demonstrate no acute abnormality. Mild right ethmoid sinus mucosal thickening. Clear mastoid air cells. BONES: Mild cervical spondylosis. No suspicious marrow lesion. IMPRESSION: 1. 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 Electronically signed by: Dasie Hamburg MD 12/10/2023 10:44 AM EST RP Workstation: [id number]   MR BRAIN W WO CONTRAST Result Date: 12/10/2023 EXAM: MRI BRAIN WITH AND WITHOUT CONTRAST 12/10/2023 10:16:57 AM TECHNIQUE: Multiplanar multisequence MRI of the head/brain was performed with and without the administration of 9 mL gadobutrol (GADAVIST) 1 MMOL/ML injection. COMPARISON: Head CT 12/20/2023 and MRI 07/19/2020. CLINICAL HISTORY: Neuro deficit, acute, stroke suspected. Multiple recent falls. FINDINGS: The study is mildly motion degraded. BRAIN AND VENTRICLES: There is a subcentimeter acute infarct in the posterior limb of the right internal capsule. A subcentimeter focus of restricted diffusion and associated enhancement in the left external capsule likely reflects a subacute infarct. There is also a punctate focus of mild trace diffusion weighted signal hyperintensity with normal ADC in the left occipital white matter which may also reflect a subacute infarct. Chronic microhemorrhages are present in the left thalamus and cerebellum. Confluent T2 hyperintensities in the cerebral white matter bilaterally have progressed from the prior MRI and are nonspecific but compatible with severe chronic small vessel ischemic disease. Chronic lacunar infarcts are again seen in the basal ganglia, thalami, pons, and cerebellum. There is mild cerebral atrophy. A normal variant cavum septum pellucidum et verge is noted. No mass, midline shift, hydrocephalus, or extra axial fluid collection is identified. Major intracranial vascular flow voids are preserved. ORBITS: No acute abnormality. SINUSES: Moderate right ethmoid sinus mucosal  thickening. Clear mastoid air cells. BONES AND SOFT TISSUES: Normal bone marrow signal and enhancement. Neck reported separately. IMPRESSION: 1. 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 2. Likely  subacute infarcts in the left external capsule and left occipital white matter. 3. Severe chronic small vessel ischemic disease. Electronically signed by: Dasie Hamburg MD 12/10/2023 10:30 AM EST RP Workstation: [id number]   CT Head Wo Contrast Result Date: 12/09/2023 EXAM: CT HEAD WITHOUT CONTRAST 12/09/2023 04:47:28 PM TECHNIQUE: CT of the head was performed without the administration of intravenous contrast. Automated exposure control, iterative reconstruction, and/or weight based adjustment of the mA/kV was utilized to reduce the radiation dose to as low as reasonably achievable. COMPARISON: None available. CLINICAL HISTORY: Head trauma, minor (Age >= 65y). FINDINGS: BRAIN AND VENTRICLES: No acute hemorrhage. No evidence of acute infarct. 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 Left cerebellar infarct. Extensive periventricular and subcortical white matter low-density changes compatible with chronic microvascular ischemic change. Mild diffuse cerebral volume loss. Cavum septum pellucidum et vergae. No hydrocephalus. No extra-axial collection. No mass effect or midline shift. ORBITS: No acute abnormality. SINUSES: No acute abnormality. SOFT TISSUES AND SKULL: No acute soft tissue abnormality. No skull fracture. Atherosclerotic calcifications in large vessels of skull base. IMPRESSION: 1. No acute intracranial abnormality. 2. 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 3. Extensive periventricular and subcortical white matter changes compatible with chronic microvascular ischemic change. Electronically signed by: Franky Stanford MD 12/09/2023 05:01 PM EDT RP Workstation: [id number]   CT Cervical Spine Wo Contrast Result Date: 12/09/2023 EXAM: CT  CERVICAL SPINE WITHOUT CONTRAST 12/09/2023 04:47:28 PM TECHNIQUE: CT of the cervical spine was performed without the administration of intravenous contrast. Multiplanar reformatted images are provided for review. Automated exposure control, iterative reconstruction, and/or weight based adjustment of the mA/kV was utilized to reduce the radiation dose to as low as reasonably achievable. COMPARISON: None available. CLINICAL HISTORY: Neck trauma (Age >= 65y) FINDINGS: CERVICAL SPINE: BONES AND ALIGNMENT: No acute fracture or traumatic malalignment. DEGENERATIVE CHANGES: No significant degenerative changes. 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 This causes mild mass effect on the airway, but the airway remains widely patent. Non-erupted central maxillary incisors. Edentulous oral cavity. IMPRESSION: 1. 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 MRI of the neck with and without contrast recommended for further evaluation. 2. No acute abnormality of the cervical spine. Electronically signed by: Franky Stanford MD 12/09/2023 05:00 PM EDT RP Workstation: [id number]   DG Chest Portable 1 View Result Date: 12/09/2023 CLINICAL DATA:  Weakness, multiple falls. EXAM: PORTABLE CHEST 1 VIEW, PORTABLE PELVIS COMPARISON:  08/16/2011. FINDINGS: Chest: The heart size and mediastinal contours are within normal limits. Lung volumes are low. No consolidation, effusion, or pneumothorax is seen. Surgical clips are present in the right upper quadrant. No acute osseous abnormality. Pelvis: There is no evidence of acute fracture or dislocation. Mild degenerative changes are noted at the hips bilaterally. Vascular calcifications are seen in the pelvis and bilateral lower extremities. IMPRESSION: 1. No active disease. 2. No  acute fracture or dislocation at the pelvis. Electronically Signed   By: Leita Birmingham M.D.   On: 12/09/2023 16:32   DG Pelvis Portable Result Date: 12/09/2023 CLINICAL DATA:  Weakness, multiple falls. EXAM: PORTABLE CHEST 1 VIEW, PORTABLE PELVIS COMPARISON:  08/16/2011. FINDINGS: Chest: The heart size and mediastinal contours are within normal limits. Lung volumes are low. No consolidation,  effusion, or pneumothorax is seen. Surgical clips are present in the right upper quadrant. No acute osseous abnormality. Pelvis: There is no evidence of acute fracture or dislocation. Mild degenerative changes are noted at the hips bilaterally. Vascular calcifications are seen in the pelvis and bilateral lower extremities. IMPRESSION: 1. No active disease. 2. No acute fracture or dislocation at the pelvis. Electronically Signed   By: Leita Birmingham M.D.   On: 12/09/2023 16:32    Microbiology: Results for orders placed or performed during the hospital encounter of 06/25/17  Urine Culture     Status: Abnormal   Collection Time: 06/25/17  3:15 AM   Specimen: Urine, Random  Result Value Ref Range Status   Specimen Description URINE, RANDOM  Final   Special Requests   Final    NONE Performed at [facility], [address], [place], [place] [address]    Culture MULTIPLE SPECIES PRESENT, SUGGEST RECOLLECTION (A)  Final   Report Status 06/26/2017 FINAL  Final    Labs: CBC: Recent Labs  Lab 12/20/23 0202 12/21/23 1216 12/22/23 0440 12/23/23 0523 12/25/23 0339  WBC 14.2* 11.3* 11.7* 10.0 11.4*  HGB 12.3 12.1 11.5* 11.9* 12.8  HCT 36.9 36.5 35.0* 36.3 39.1  MCV 89.3 89.2 89.1 89.2 89.3  PLT 269 259 293 317 319   Basic Metabolic Panel: Recent Labs  Lab 12/20/23 0202 12/21/23 1216 12/22/23 0440 12/23/23 0523 12/25/23 0339  NA 140 137 136 138 137  K 4.5 4.1 4.1 4.2 4.5  CL 104 103 101 103 101  CO2 24 24 25 27 26   GLUCOSE 110* 109* 129* 104* 96  BUN 19 17 22 21 17   CREATININE 0.90 0.94  1.15* 0.99 0.97  CALCIUM  9.8 9.7 9.7 9.9 10.1  MG 1.9  --   --  1.7 1.7  PHOS 2.5  --   --   --   --    Liver Function Tests: Recent Labs  Lab 12/21/23 1216 12/22/23 0440 12/23/23 0523 12/25/23 0339  AST 42* 43* 45* 56*  ALT 64* 68* 75* 103*  ALKPHOS 97 92 88 110  BILITOT 1.2 0.9 1.0 1.0  PROT 7.0 6.3* 6.6 7.2  ALBUMIN 2.6* 2.4* 2.5* 2.8*   CBG: No results for input(s): GLUCAP in the last 168 hours.  Discharge time spent: less than 30 minutes.  Signed: Garnette Pelt, MD [facility] 12/25/2023

## 2023-12-25 NOTE — TOC Progression Note (Signed)
 Transition of Care [facility]) - Progression Note    Patient Details  Name: Angela Morrison MRN: [id number] Date of Birth: [birth year]  Transition of Care [facility] CM/SW Contact  Sherline Clack, [place] Phone Number: 12/25/2023, 1:10 PM  Clinical Narrative:     CSW spoke with Darrian at [facility] to confirm bed availability today. Patient has a bed at the facility when she is able to discharge. CSW spoke with daughter/[name] via phone call regarding patient's anticipated transfer to [facility] today. [name] is agreeable to patient discharging. [name] is currently at work, but she states she will be able to visit the patient at the facility at around 8 pm today. [name] requested CSW set up nonemergency ambulance transport for patient. Provider made aware.   Expected Discharge Plan: Skilled Nursing Facility Barriers to Discharge: SNF Pending bed offer               Expected Discharge Plan and Services                                               Social Drivers of Health (SDOH) Interventions SDOH Screenings   Food Insecurity: No Food Insecurity (12/13/2023)  Housing: Low Risk  (12/13/2023)  Transportation Needs: Patient Unable To Answer (12/13/2023)  Utilities: Patient Unable To Answer (12/13/2023)  Depression (PHQ2-9): Medium Risk (05/23/2019)  Social Connections: Patient Unable To Answer (12/13/2023)  Tobacco Use: Low Risk  (12/09/2023)    Readmission Risk Interventions    12/13/2023    3:40 PM  Readmission Risk Prevention Plan  Post Dischage Appt Complete  Medication Screening Complete  Transportation Screening Complete

## 2023-12-28 ENCOUNTER — Other Ambulatory Visit: Payer: Self-pay | Admitting: Neurology

## 2023-12-28 LAB — VITAMIN B6: Vitamin B6: 2.5 ug/L — ABNORMAL LOW (ref 3.4–65.2)

## 2023-12-28 NOTE — Progress Notes (Signed)
 Called patient's husband Mr. Trine Agent to let him know that patient's Vit B6 levels and Folic acid levels are low. He reports that patient is currently at [facility] on [address] so I called the staff and let them know that the levels are low and need to be replaced.  Tiannah Greenly [facility]

## 2024-01-18 ENCOUNTER — Institutional Professional Consult (permissible substitution) (INDEPENDENT_AMBULATORY_CARE_PROVIDER_SITE_OTHER)

## 2024-01-22 NOTE — Progress Notes (Deleted)
 [facility] [address] [place]. [place] [address] [phone]       HOSPITAL FOLLOW UP NOTE  Ms. Angela Morrison Date of Birth:  [birth year] Medical Record Number:  [id number]   Reason for Referral:  hospital stroke follow up    SUBJECTIVE:   CHIEF COMPLAINT:  No chief complaint on file.   HPI:   Ms. Angela Morrison is a 73 y.o. female with history of hypertension, hyperlipidemia, CAD, COPD, dementia who presented on 12/09/2023 with confusion and left lower extremity weakness.  Stroke workup revealed bilateral BG and left temporal WM small infarcts, etiology embolic vs synchronized small vessel disease.  Repeat MRI showed additional bilateral small punctate infarcts more suggestive of cardioembolic source. EF 55 to 60%.  LE Doppler negative for DVT.  Recommended 30-day cardiac event monitor to rule out A-fib, refused loop recorder placement.  LDL 95.  A1c 5.8.  Recommended DAPT for 3 weeks and aspirin  alone and continued atorvastatin  and Zetia .  Therapies recommended discharge to SNF for ongoing therapy needs.        PERTINENT IMAGING  CT head no acute finding.  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 CT head and neck right fetal PCA, atherosclerosis bilateral carotid siphon and bilateral V4 segments. MRI acute subcentimeter infarct in the posterior limb of the right internal capsule.  Subacute infarcts in the left external capsule and left occipital white matter. Limited MRI repeat showed Few scattered subcentimeter acute to early subacute ischemic infarcts involving both cerebral hemispheres and left cerebellum, new as compared to prior MRI 2D Echo EF 55 to 60% LE venous Doppler no DVT LDL 95 HgbA1c 5.8    ROS:   14 system review of systems performed and negative with exception of ***  PMH:  Past Medical History:  Diagnosis Date   Anemia    few yrs ago none recent   Arthritis    Constipation    Coronary artery disease  2005   S/P PCI of RCA   Depression    Diabetes mellitus without complication (HCC)    type 2   Edema, lower extremity    History of MI (myocardial infarction)    Hyperlipidemia    Hypertension    Leg cramps    Lower back pain    Obesity    PONV (postoperative nausea and vomiting)    Shoulder pain    Thyroid  nodule    S/P bx 8/13 c/w goiter   Vitamin D deficiency    Weakness     PSH:  Past Surgical History:  Procedure Laterality Date   appendectomy Right    BACK SURGERY     lower back   CHOLECYSTECTOMY N/A 07/02/2017   Procedure: LAPAROSCOPIC CHOLECYSTECTOMY WITH INTRAOPERATIVE CHOLANGIOGRAM;  Surgeon: Mikell Katz, MD;  Location: [facility];  Service: General;  Laterality: N/A;   COLONOSCOPY WITH PROPOFOL  N/A 05/17/2016   Procedure: COLONOSCOPY WITH PROPOFOL ;  Surgeon: Gladis MARLA Louder, MD;  Location: [facility];  Service: Endoscopy;  Laterality: N/A;    Social History:  Social History   Socioeconomic History   Marital status: Married    Spouse name: Not on file   Number of children: 1   Years of education: Not on file   Highest education level: Not on file  Occupational History   Occupation: retired  Tobacco Use   Smoking status: Never   Smokeless tobacco: Never  Vaping Use   Vaping status: Never Used  Substance and Sexual Activity   Alcohol  use: Yes    Comment: occ   Drug use: No   Sexual activity: Not on file  Other Topics Concern   Not on file  Social History Narrative   Not on file   Social Drivers of Health   Tobacco Use: Low Risk (12/09/2023)   Patient History    Smoking Tobacco Use: Never    Smokeless Tobacco Use: Never    Passive Exposure: Not on file  Financial Resource Strain: Not on file  Food Insecurity: No Food Insecurity (12/13/2023)   Epic    Worried About Programme Researcher, Broadcasting/film/video in the Last Year: Never true    Ran Out of Food in the Last Year: Never true  Transportation Needs: Patient Unable To Answer (12/13/2023)   Epic    Lack of  Transportation (Medical): Patient unable to answer    Lack of Transportation (Non-Medical): Patient unable to answer  Physical Activity: Not on file  Stress: Not on file  Social Connections: Patient Unable To Answer (12/13/2023)   Social Connection and Isolation Panel    Frequency of Communication with Friends and Family: Patient unable to answer    Frequency of Social Gatherings with Friends and Family: Patient unable to answer    Attends Religious Services: Patient unable to answer    Active Member of Clubs or Organizations: Patient unable to answer    Attends Banker Meetings: Patient unable to answer    Marital Status: Patient unable to answer  Intimate Partner Violence: Not At Risk (12/13/2023)   Epic    Fear of Current or Ex-Partner: No    Emotionally Abused: No    Physically Abused: No    Sexually Abused: No  Depression (PHQ2-9): Not on file  Alcohol Screen: Not on file  Housing: Low Risk (12/13/2023)   Epic    Unable to Pay for Housing in the Last Year: No    Number of Times Moved in the Last Year: 0    Homeless in the Last Year: No  Utilities: Patient Unable To Answer (12/13/2023)   Epic    Threatened with loss of utilities: Patient unable to answer  Health Literacy: Not on file    Family History:  Family History  Problem Relation Age of Onset   Alzheimer's disease Mother    Hypertension Mother    Dementia Father    Hypertension Father    Alcoholism Father     Medications:  Medications Ordered Prior to Encounter[1]  Allergies:  Allergies[2]    OBJECTIVE:  Physical Exam  There were no vitals filed for this visit. There is no height or weight on file to calculate BMI. No results found.   General: well developed, well nourished, seated, in no evident distress Head: head normocephalic and atraumatic.   Neck: supple with no carotid or supraclavicular bruits Cardiovascular: regular rate and rhythm, no murmurs Musculoskeletal: no deformity Skin:   no rash/petichiae Vascular:  Normal pulses all extremities   Neurologic Exam Mental Status: Awake and fully alert. Oriented to place and time. Recent and remote memory intact. Attention span, concentration and fund of knowledge appropriate. Mood and affect appropriate.  Cranial Nerves: Fundoscopic exam reveals sharp disc margins. Pupils equal, briskly reactive to light. Extraocular movements full without nystagmus. Visual fields full to confrontation. Hearing intact. Facial sensation intact. Face, tongue, palate moves normally and symmetrically.  Motor: Normal bulk and tone. Normal strength in all tested extremity muscles Sensory.: intact to touch , pinprick , position and vibratory sensation.  Coordination: Rapid alternating movements normal in all extremities. Finger-to-nose and heel-to-shin performed accurately bilaterally. Gait and Station: Arises from chair without difficulty. Stance is normal. Gait demonstrates normal stride length and balance with ***. Tandem walk and heel toe ***.  Reflexes: 1+ and symmetric. Toes downgoing.     NIHSS  *** Modified Rankin  ***      ASSESSMENT: Angela Morrison is a 73 y.o. year old female with bilateral BG and left temporal white matter small infarcts on 12/09/2023, etiology embolic versus synchronize small vessel disease and repeat MRI brain 11/14 showed acute to early subacute issue make infarcts involving both cerebral hemispheres and left cerebellum new compared to prior imaging more suspicious for cardioembolic source. Vascular risk factors include history of prior strokes, HTN, HLD and dementia.      PLAN:  Cryptogenic strokes:  Residual deficit: ***.  Continue aspirin  81mg  daily, Zetia  and atorvastatin  (Lipitor) for secondary stroke prevention managed/prescribed by PCP.   Discussed secondary stroke prevention measures and importance of close PCP follow up for aggressive stroke risk factor management including BP goal<130/90, HLD with  LDL goal<70 and DM with A1c.<7 .  Stroke labs 12/2023: LDL 95, A1c 5.8 I have gone over the pathophysiology of stroke, warning signs and symptoms, risk factors and their management in some detail with instructions to go to the closest emergency room for symptoms of concern.     Follow up in *** or call earlier if needed   CC:  [facility] provider: Dr. Rosemarie PCP: Aisha Harvey, MD    I personally spent a total of *** minutes in the care of the patient today including {Time Based Coding:[id number]}.    Harlene Bogaert, AGNP-BC  [facility] [address] [place], [place] [address]  Phone [phone] Fax [phone] Note: This document was prepared with digital dictation and possible smart phrase technology. Any transcriptional errors that result from this process are unintentional.         [1]  Current Outpatient Medications on File Prior to Visit  Medication Sig Dispense Refill   aspirin  EC 81 MG tablet Take 1 tablet (81 mg total) by mouth daily. Swallow whole.     atorvastatin  (LIPITOR) 80 MG tablet TAKE ONE TABLET BY MOUTH EVERY MORNING 30 tablet 0   clopidogrel  (PLAVIX ) 75 MG tablet Take 1 tablet (75 mg total) by mouth daily.     ezetimibe  (ZETIA ) 10 MG tablet Take 10 mg by mouth daily.     polyethylene glycol (MIRALAX  / GLYCOLAX ) 17 g packet Take 17 g by mouth daily as needed for moderate constipation.     traZODone  (DESYREL ) 100 MG tablet Take 1 tablet (100 mg total) by mouth at bedtime as needed for sleep.     No current facility-administered medications on file prior to visit.  [2]  Allergies Allergen Reactions   Crestor [Rosuvastatin]     constipation   Morphine  Sulfate Other (See Comments)   Penicillins Itching

## 2024-01-23 ENCOUNTER — Inpatient Hospital Stay: Admitting: Adult Health

## 2024-01-23 ENCOUNTER — Encounter: Payer: Self-pay | Admitting: Adult Health

## 2024-02-13 ENCOUNTER — Ambulatory Visit: Attending: General Practice | Admitting: General Practice

## 2024-02-13 NOTE — Progress Notes (Deleted)
" °  Cardiology Office Note   Date:  02/13/2024  ID:  ROGAN ECKLUND, DOB 04/20/1950, MRN [id number] PCP: Aisha Harvey, MD   [facility] Providers Cardiologist:  Wilbert Bihari, MD { Click to update primary MD,subspecialty MD or APP then REFRESH:1}    History of Present Illness Angela Morrison is a 74 y.o. female with history of CAD s/p PCI of RCA in 2005, hypertension, hyperlipidemia, obesity, OSA, and type 2 diabetes.    She is a patient followed by Dr. Bihari.  She had previous PCI of the RCA in 2005.  Carotid duplex 08/2011; bilateral less than 50% ICA plaque.  Stress Myoview  05/2017 in the setting of arm pain results; stress EF 64%, no ST segment deviation noted during stress, there is a medium defect of mild severity present in the mid anterior and apical anterior location, low risk study. Home sleep study 06/2019 shows severe sleep apnea with recommendation of CPAP titration.  ABI obtained in the setting of PAD screening 04/03/2020 with normal results and no evidence of bilateral arterial disease.  She was last seen in office 04/13/2020 by Dr. Bihari and appeared to be doing relatively well from a cardiac standpoint.  It was recommended she follow-up in 1 year.  She was admitted to hospital 12/09/2023 in the setting of cryptogenic stroke.  Loop recorder placed to monitor for atrial fibrillation.  Most recent echo 12/21/2023 LVEF 55-60%, no RWMA, mild concentric LV hypertrophy, grade 1 DD, and RV normal.  ILR implant recommended and patient deferred.    She presents today for hospital follow up in the setting of monitor results.   Cryptogenic stroke-  CAD s/p PCI/ LDL goal <55-   HTN-     ROS: All systems negative unless otherwise indicated HPI.  Studies Reviewed      *** Risk Assessment/Calculations {Does this patient have ATRIAL FIBRILLATION?[phone]} No BP recorded.  {Refresh Note OR Click here to enter BP  :1}***       Physical Exam VS:  There were no vitals  taken for this visit.       Wt Readings from Last 3 Encounters:  12/24/23 198 lb 13.7 oz (90.2 kg)  05/19/20 230 lb (104.3 kg)  04/28/20 231 lb (104.8 kg)    GEN: Well nourished, well developed in no acute distress NECK: No JVD; No carotid bruits CARDIAC: ***RRR, no murmurs, rubs, gallops RESPIRATORY:  Clear to auscultation without rales, wheezing or rhonchi  ABDOMEN: Soft, non-tender, non-distended EXTREMITIES:  No edema; No deformity   ASSESSMENT AND PLAN ***    {Are you ordering a CV Procedure (e.g. stress test, cath, DCCV, TEE, etc)?   Press F2        :[id number]}  Dispo: ***  Signed, Angela KATHEE Pizza, FNP  "
